# Patient Record
Sex: Female | Born: 1985 | Hispanic: Yes | Marital: Single | Smoking: Never smoker
Health system: Southern US, Community
[De-identification: ages and names within clinical notes are randomized; demographics above are authoritative.]

## PROBLEM LIST (undated history)

## (undated) DIAGNOSIS — Z8619 Personal history of other infectious and parasitic diseases: Secondary | ICD-10-CM

## (undated) DIAGNOSIS — H33001 Unspecified retinal detachment with retinal break, right eye: Secondary | ICD-10-CM

## (undated) DIAGNOSIS — D649 Anemia, unspecified: Secondary | ICD-10-CM

## (undated) DIAGNOSIS — Z9889 Other specified postprocedural states: Secondary | ICD-10-CM

## (undated) DIAGNOSIS — N898 Other specified noninflammatory disorders of vagina: Secondary | ICD-10-CM

## (undated) DIAGNOSIS — R112 Nausea with vomiting, unspecified: Secondary | ICD-10-CM

## (undated) DIAGNOSIS — A64 Unspecified sexually transmitted disease: Secondary | ICD-10-CM

## (undated) DIAGNOSIS — Z523 Bone marrow donor: Secondary | ICD-10-CM

## (undated) DIAGNOSIS — K219 Gastro-esophageal reflux disease without esophagitis: Secondary | ICD-10-CM

## (undated) DIAGNOSIS — F419 Anxiety disorder, unspecified: Secondary | ICD-10-CM

## (undated) DIAGNOSIS — O3663X0 Maternal care for excessive fetal growth, third trimester, not applicable or unspecified: Secondary | ICD-10-CM

## (undated) HISTORY — DX: Bone marrow donor: Z52.3

## (undated) HISTORY — DX: Unspecified sexually transmitted disease: A64

## (undated) HISTORY — DX: Other specified noninflammatory disorders of vagina: N89.8

## (undated) HISTORY — PX: RETINAL TEAR REPAIR CRYOTHERAPY: SHX5304

## (undated) HISTORY — DX: Personal history of other infectious and parasitic diseases: Z86.19

## (undated) HISTORY — DX: Unspecified retinal detachment with retinal break, right eye: H33.001

## (undated) HISTORY — PX: OTHER SURGICAL HISTORY: SHX169

## (undated) HISTORY — DX: Maternal care for excessive fetal growth, third trimester, not applicable or unspecified: O36.63X0

---
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
## 2007-01-29 ENCOUNTER — Other Ambulatory Visit: Admission: RE | Admit: 2007-01-29 | Discharge: 2007-01-29 | Payer: Self-pay | Admitting: Obstetrics and Gynecology

## 2007-04-18 ENCOUNTER — Emergency Department (HOSPITAL_COMMUNITY): Admission: EM | Admit: 2007-04-18 | Discharge: 2007-04-18 | Payer: Self-pay | Admitting: Emergency Medicine

## 2007-10-10 ENCOUNTER — Encounter: Admission: RE | Admit: 2007-10-10 | Discharge: 2007-10-10 | Payer: Self-pay | Admitting: Emergency Medicine

## 2007-12-25 ENCOUNTER — Ambulatory Visit (HOSPITAL_COMMUNITY): Admission: RE | Admit: 2007-12-25 | Discharge: 2007-12-26 | Payer: Self-pay | Admitting: Otolaryngology

## 2007-12-25 ENCOUNTER — Encounter (INDEPENDENT_AMBULATORY_CARE_PROVIDER_SITE_OTHER): Payer: Self-pay | Admitting: Otolaryngology

## 2008-03-19 HISTORY — PX: LYMPH NODE BIOPSY: SHX201

## 2008-07-27 ENCOUNTER — Other Ambulatory Visit: Admission: RE | Admit: 2008-07-27 | Discharge: 2008-07-27 | Payer: Self-pay | Admitting: Obstetrics and Gynecology

## 2010-08-01 NOTE — Op Note (Signed)
NAMESAHRA, CONVERSE                ACCOUNT NO.:  [id number]   MEDICAL RECORD NO.:  [id number]          PATIENT TYPE:  OIB   LOCATION:  5151                         FACILITY:  [facility]   PHYSICIAN:  Kinnie Scales. 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   SURGICAL PROCEDURE:  Transcervical excision of left neck cyst.   SURGEON:  Kinnie Scales. Annalee Genta, MD   ASSISTANT:  Gloris Manchester. Lazarus Salines, MD   ANESTHESIA:  General endotracheal.   COMPLICATIONS:  None.   ESTIMATED BLOOD LOSS:  Minimal.   The patient transferred from the operating room to recovery room in  stable condition.   BRIEF HISTORY:  The patient is a 25 year old Hispanic female who is  referred to our office by Dr. Darnell Level for evaluation of a  progressive left neck mass.  The patient reported a 43-month history of  gradual enlarging mass involving the left superolateral neck.  There had  been mild discomfort, no significant erythema, and no evidence of active  infection.  The patient was treated with a course of antibiotics and a  CT scan with contrast was obtained.  This showed a complex mass in the  left neck deep to the sternocleidomastoid muscle and adjacent to the  carotid artery and jugular vein with extension medially to the  parapharyngeal area.  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  Given the patient's history and physical examination, I  recommended to undertake excision of the cyst under general anesthesia.  The risk, benefits and possible complications of this procedure  were  discussed in detail with the patient and her mother and they understood  and concurred with our plan for surgery which is scheduled under general  anesthesia at [facility] OR with an overnight observation.  The risks and benefits of the procedure were discussed in detail and  they understood and concurred with our plan which is scheduled as above.   PROCEDURE:  The patient was brought to the operating room on December 25, 2007, and placed in a supine position on the operating table.  General  endotracheal anesthesia was established without difficulty and when the  patient was adequately anesthetized, she was positioned on the operating  table and prepped and draped in a sterile fashion.  The patient was  injected with 3 mL of 1% lidocaine and 1:100,000 dilution of epinephrine  was injected in a subcutaneous fashion in the skin underlying the  proposed incision.  After allowing adequate time for vasoconstriction  and hemostasis, the procedure was begun.   A 4-cm horizontally oriented incision was created in the left lateral  neck.  This was carried through the skin  underlying deep subcutaneous  tissue.  The platysma muscle was identified and divided and subplatysmal  flaps were elevated superiorly and inferiorly to allow access to the  left neck.  The anterior border of the sternocleidomastoid muscle was  identified and overlying fascia was dissected.  Sternocleidomastoid  muscle was reflected posteriorly and a large dark-colored cystic mass  was identified in the deep compartment of the neck.  With gentle blunt  and sharp dissection, the entire mass was dissected free of the  surrounding tissue.  The carotid artery and jugular vein were identified  and preserved throughout their course, the facial vein was identified  and reflected anteriorly and superiorly and was preserved.  The vagus  nerve was also identified and preserved.  Dissection was then carefully  carried  out along the entire cystic mass which was removed and sent to  pathology for gross microscopic evaluation.  There was a second superior  firm nodule consistent with a lymph node was also dissected and sent to  pathology as well.  The patient's wound was then thoroughly irrigated  with sterile saline solution.  There was no active bleeding.  The wound  was closed in layers and prior to closure, a 7-French round drain was  placed in the depth of the incision and sutured into position with a 3-0  Ethibond suture.  The incision was then carefully closed with a 4-0  Vicryl suture in an interrupted fashion to reapproximate the platysma  muscle, deep subcutaneous tissue was closed with 5-0 Vicryl and the  final skin edge was approximated with Dermabond surgical glue.  The  patient was then awakened from anesthetic, extubated and transferred  from the operating room to the recovery room in stable condition.  No  complications.  Blood loss was minimal.           ______________________________  Kinnie Scales. Annalee Genta, M.D.     DLS/MEDQ  D:  16/12/9602  T:  12/25/2007  Job:  [id number]

## 2010-12-07 LAB — I-STAT 8, (EC8 V) (CONVERTED LAB)
BUN: 10
Chloride: 109
Glucose, Bld: 93
Potassium: 3.7
pH, Ven: 7.388 — ABNORMAL HIGH

## 2010-12-07 LAB — URINALYSIS, ROUTINE W REFLEX MICROSCOPIC
Bilirubin Urine: NEGATIVE
Hgb urine dipstick: NEGATIVE
Protein, ur: NEGATIVE
Urobilinogen, UA: 1

## 2010-12-07 LAB — WET PREP, GENITAL
Clue Cells Wet Prep HPF POC: NONE SEEN
Trich, Wet Prep: NONE SEEN

## 2010-12-07 LAB — ABO/RH: ABO/RH(D): A POS

## 2010-12-07 LAB — PREGNANCY, URINE

## 2010-12-07 LAB — POCT I-STAT CREATININE
Creatinine, Ser: 0.7
Operator id: 294501

## 2010-12-07 LAB — GC/CHLAMYDIA PROBE AMP, GENITAL

## 2010-12-18 LAB — HCG, SERUM, QUALITATIVE: Preg, Serum: NEGATIVE

## 2010-12-18 LAB — CBC
Platelets: 272
WBC: 7.6

## 2011-07-11 ENCOUNTER — Encounter (INDEPENDENT_AMBULATORY_CARE_PROVIDER_SITE_OTHER): Payer: Managed Care, Other (non HMO) | Admitting: Ophthalmology

## 2011-07-11 DIAGNOSIS — H43819 Vitreous degeneration, unspecified eye: Secondary | ICD-10-CM

## 2011-07-11 DIAGNOSIS — H33009 Unspecified retinal detachment with retinal break, unspecified eye: Secondary | ICD-10-CM

## 2011-07-11 DIAGNOSIS — H33309 Unspecified retinal break, unspecified eye: Secondary | ICD-10-CM

## 2011-07-11 DIAGNOSIS — H35419 Lattice degeneration of retina, unspecified eye: Secondary | ICD-10-CM

## 2011-07-12 ENCOUNTER — Other Ambulatory Visit (INDEPENDENT_AMBULATORY_CARE_PROVIDER_SITE_OTHER): Payer: Self-pay | Admitting: Ophthalmology

## 2011-07-12 ENCOUNTER — Encounter (HOSPITAL_COMMUNITY): Payer: Self-pay | Admitting: Pharmacy Technician

## 2011-07-12 ENCOUNTER — Ambulatory Visit (INDEPENDENT_AMBULATORY_CARE_PROVIDER_SITE_OTHER): Payer: Managed Care, Other (non HMO) | Admitting: Family Medicine

## 2011-07-12 DIAGNOSIS — F411 Generalized anxiety disorder: Secondary | ICD-10-CM

## 2011-07-12 DIAGNOSIS — K219 Gastro-esophageal reflux disease without esophagitis: Secondary | ICD-10-CM

## 2011-07-12 DIAGNOSIS — R079 Chest pain, unspecified: Secondary | ICD-10-CM

## 2011-07-12 DIAGNOSIS — H33001 Unspecified retinal detachment with retinal break, right eye: Secondary | ICD-10-CM

## 2011-07-12 DIAGNOSIS — F419 Anxiety disorder, unspecified: Secondary | ICD-10-CM

## 2011-07-12 HISTORY — DX: Unspecified retinal detachment with retinal break, right eye: H33.001

## 2011-07-12 MED ORDER — LORAZEPAM 0.5 MG PO TABS: 0.5000 mg | ORAL_TABLET | Freq: Two times a day (BID) | ORAL | Status: AC | PRN

## 2011-07-12 MED ORDER — OMEPRAZOLE 40 MG PO CPDR: 40.0000 mg | DELAYED_RELEASE_CAPSULE | Freq: Every day | ORAL | Status: DC

## 2011-07-12 NOTE — H&P (Signed)
Nichole Davis is an 26 y.o. female.   Chief Complaint: Flashes and shadows right eye  HPI: Rhegmatogenous retinal detachment right eye  No past medical history on file.  No past surgical history on file.  No family history on file. Social History:  does not have a smoking history on file. She does not have any smokeless tobacco history on file. Her alcohol and drug histories not on file.  Allergies: No Known Allergies   (Not in a hospital admission)  Review of systems otherwise negative  There were no vitals taken for this visit.  Physical exam: Mental status: oriented x3. Eyes: See eye exam associated with this date of surgery in media tab.  Scanned in by scanning center Ears, Nose, Throat: within normal limits Neck: Within Normal limits General: within normal limits Chest: Within normal limits Breast: deferred Heart: Within normal limits Abdomen: Within normal limits GU: deferred Extremities: within normal limits Skin: within normal limits  Assessment/Plan Rhegmatogenous retinal detachment right eye Plan: To [facility] for Scleral buckle repair of Rhegmatogenous retinal detachment right eye  Sherrie George 07/12/2011, 12:56 PM

## 2011-07-12 NOTE — Patient Instructions (Signed)
You are probably having some acid reflux causing your chest wall pains. He has his probably being made worse by the stress and anxiety regarding your upcoming surgery. The medication for anxiety he is to be used only on an as-needed basis, and is not intended for long-term use.  I do not see anything at this time that should be a problem for you undergoing her surgery next week. However if having worse problems before then return for a recheck.

## 2011-07-12 NOTE — Progress Notes (Signed)
Subjective: Patient is here with chest pain for the past week. It's been hurting her through the day he. She sometimes even wakes up in the morning with it. It's a substernal squeezing-type sensation. She is scheduled to undergo surgery for a detaching retina next week. She is anxious about that. She does not know of having any GERD type symptoms. She's not sure exactly what that is.  Objective: Alert oriented young lady in no acute distress. Throat clear. Neck supple without significant nodes. Chest clear. Heart regular without murmurs gallops or arrhythmias. No chest wall tenderness. Soft without mass or tenderness.  Assessment: Chest pain  Anxiety retinal detachment  Plan: Check EKG. Assuming this is normal, will treat for GERD and anxiety. EKG was normal. Lorazepam and omeprazole prescribed. Return when necessary.

## 2011-07-13 ENCOUNTER — Encounter (HOSPITAL_COMMUNITY): Payer: Self-pay | Admitting: *Deleted

## 2011-07-13 NOTE — Progress Notes (Signed)
Pt states she had some chest pain on 4/25, went to urgent care on [address] and had EKG, was told it was probably anxiety and given prescription for Ativan. Will request EKG.

## 2011-07-16 MED ORDER — PHENYLEPHRINE HCL 2.5 % OP SOLN
1.0000 [drp] | OPHTHALMIC | Status: DC | PRN
  Filled 2011-07-16: qty 3

## 2011-07-16 MED ORDER — TROPICAMIDE 1 % OP SOLN
1.0000 [drp] | OPHTHALMIC | Status: DC | PRN
  Filled 2011-07-16: qty 3

## 2011-07-16 MED ORDER — CYCLOPENTOLATE HCL 1 % OP SOLN
1.0000 [drp] | OPHTHALMIC | Status: DC | PRN
Start: 2011-07-16 — End: 2011-07-17
  Filled 2011-07-16: qty 2

## 2011-07-16 MED ORDER — CEFAZOLIN SODIUM 1-5 GM-% IV SOLN
1.0000 g | INTRAVENOUS | Status: DC
  Filled 2011-07-16: qty 50

## 2011-07-16 MED ORDER — GATIFLOXACIN 0.5 % OP SOLN
1.0000 [drp] | OPHTHALMIC | Status: DC | PRN
  Filled 2011-07-16: qty 2.5

## 2011-07-17 ENCOUNTER — Encounter (HOSPITAL_COMMUNITY): Payer: Self-pay

## 2011-07-17 ENCOUNTER — Encounter (HOSPITAL_COMMUNITY)
Admission: RE | Disposition: A | Discharge status: Home / Self Care | Payer: Self-pay | Source: Ambulatory Visit | Attending: Ophthalmology

## 2011-07-17 ENCOUNTER — Ambulatory Visit (HOSPITAL_COMMUNITY)
Admission: RE | Admit: 2011-07-17 | Discharge: 2011-07-18 | Disposition: A | Discharge status: Home / Self Care | Payer: Managed Care, Other (non HMO) | Source: Ambulatory Visit | Attending: Ophthalmology | Admitting: Ophthalmology

## 2011-07-17 ENCOUNTER — Encounter (HOSPITAL_COMMUNITY): Payer: Self-pay | Admitting: Anesthesiology

## 2011-07-17 ENCOUNTER — Encounter (HOSPITAL_COMMUNITY): Payer: Self-pay | Admitting: General Practice

## 2011-07-17 ENCOUNTER — Ambulatory Visit (HOSPITAL_COMMUNITY): Payer: Managed Care, Other (non HMO) | Admitting: Anesthesiology

## 2011-07-17 ENCOUNTER — Telehealth: Payer: Self-pay

## 2011-07-17 DIAGNOSIS — H33001 Unspecified retinal detachment with retinal break, right eye: Secondary | ICD-10-CM

## 2011-07-17 DIAGNOSIS — H33009 Unspecified retinal detachment with retinal break, unspecified eye: Secondary | ICD-10-CM | POA: Insufficient documentation

## 2011-07-17 DIAGNOSIS — H35419 Lattice degeneration of retina, unspecified eye: Secondary | ICD-10-CM | POA: Insufficient documentation

## 2011-07-17 DIAGNOSIS — F411 Generalized anxiety disorder: Secondary | ICD-10-CM | POA: Insufficient documentation

## 2011-07-17 DIAGNOSIS — K219 Gastro-esophageal reflux disease without esophagitis: Secondary | ICD-10-CM | POA: Insufficient documentation

## 2011-07-17 HISTORY — DX: Nausea with vomiting, unspecified: R11.2

## 2011-07-17 HISTORY — PX: SCLERAL BUCKLE: SHX5340

## 2011-07-17 HISTORY — PX: RETINAL DETACHMENT REPAIR W/ SCLERAL BUCKLE LE: SHX2338

## 2011-07-17 HISTORY — DX: Anemia, unspecified: D64.9

## 2011-07-17 HISTORY — DX: Nausea with vomiting, unspecified: Z98.890

## 2011-07-17 HISTORY — DX: Anxiety disorder, unspecified: F41.9

## 2011-07-17 HISTORY — DX: Gastro-esophageal reflux disease without esophagitis: K21.9

## 2011-07-17 LAB — CBC
HCT: 36.2 % (ref 36.0–46.0)
MCHC: 32 g/dL (ref 30.0–36.0)
MCV: 78.7 fL (ref 78.0–100.0)
Platelets: 260 10*3/uL (ref 150–400)
RDW: 15.1 % (ref 11.5–15.5)
WBC: 6 10*3/uL (ref 4.0–10.5)

## 2011-07-17 LAB — BASIC METABOLIC PANEL
CO2: 25 mEq/L (ref 19–32)
Calcium: 9.1 mg/dL (ref 8.4–10.5)
Creatinine, Ser: 0.72 mg/dL (ref 0.50–1.10)
GFR calc non Af Amer: >90 mL/min (ref >90)
Glucose, Bld: 91 mg/dL (ref 70–99)
Sodium: 141 mEq/L (ref 135–145)

## 2011-07-17 LAB — SURGICAL PCR SCREEN: Staphylococcus aureus: NEGATIVE

## 2011-07-17 SURGERY — SCLERAL BUCKLE
Anesthesia: General | Site: Eye | Laterality: Right | Wound class: Clean

## 2011-07-17 MED ORDER — ONDANSETRON HCL 4 MG/2ML IJ SOLN: 4.0000 mg | Freq: Four times a day (QID) | INTRAMUSCULAR | Status: DC | PRN

## 2011-07-17 MED ORDER — MUPIROCIN 2 % EX OINT
TOPICAL_OINTMENT | CUTANEOUS | Status: AC
  Administered 2011-07-17: 1
  Filled 2011-07-17: qty 22

## 2011-07-17 MED ORDER — BRIMONIDINE TARTRATE 0.2 % OP SOLN
1.0000 [drp] | Freq: Two times a day (BID) | OPHTHALMIC | Status: DC
  Filled 2011-07-17: qty 5

## 2011-07-17 MED ORDER — SODIUM CHLORIDE 0.9 % IV SOLN
INTRAVENOUS | Status: DC | PRN
  Administered 2011-07-17 (×2): via INTRAVENOUS

## 2011-07-17 MED ORDER — PROMETHAZINE HCL 25 MG/ML IJ SOLN
INTRAMUSCULAR | Status: AC
  Filled 2011-07-17: qty 1

## 2011-07-17 MED ORDER — DEXAMETHASONE SODIUM PHOSPHATE 10 MG/ML IJ SOLN
INTRAMUSCULAR | Status: DC | PRN
  Administered 2011-07-17: 10 mg

## 2011-07-17 MED ORDER — HEMOSTATIC AGENTS (NO CHARGE) OPTIME
TOPICAL | Status: DC | PRN
  Administered 2011-07-17: 1 via TOPICAL

## 2011-07-17 MED ORDER — MIDAZOLAM HCL 5 MG/5ML IJ SOLN
INTRAMUSCULAR | Status: DC | PRN
  Administered 2011-07-17: 2 mg via INTRAVENOUS

## 2011-07-17 MED ORDER — SODIUM CHLORIDE 0.9 % IR SOLN
Status: DC | PRN
  Administered 2011-07-17: 200 mL

## 2011-07-17 MED ORDER — HYDROMORPHONE HCL PF 1 MG/ML IJ SOLN: 0.2500 mg | INTRAMUSCULAR | Status: DC | PRN

## 2011-07-17 MED ORDER — BSS IO SOLN
INTRAOCULAR | Status: DC | PRN
  Administered 2011-07-17: 15 mL via INTRAOCULAR

## 2011-07-17 MED ORDER — BUPIVACAINE HCL 0.75 % IJ SOLN
INTRAMUSCULAR | Status: DC | PRN
  Administered 2011-07-17: 10 mL

## 2011-07-17 MED ORDER — SODIUM CHLORIDE 0.45 % IV SOLN
INTRAVENOUS | Status: DC
  Administered 2011-07-17: 17:00:00 via INTRAVENOUS

## 2011-07-17 MED ORDER — MAGNESIUM HYDROXIDE 400 MG/5ML PO SUSP: 15.0000 mL | Freq: Four times a day (QID) | ORAL | Status: DC | PRN

## 2011-07-17 MED ORDER — PROMETHAZINE HCL 25 MG/ML IJ SOLN
12.5000 mg | Freq: Four times a day (QID) | INTRAMUSCULAR | Status: AC | PRN
  Administered 2011-07-17: 6.25 mg via INTRAVENOUS

## 2011-07-17 MED ORDER — TROPICAMIDE 1 % OP SOLN
1.0000 [drp] | OPHTHALMIC | Status: AC | PRN
  Administered 2011-07-17 (×3): 1 [drp] via OPHTHALMIC

## 2011-07-17 MED ORDER — MORPHINE SULFATE 2 MG/ML IJ SOLN: 1.0000 mg | INTRAMUSCULAR | Status: DC | PRN

## 2011-07-17 MED ORDER — BACITRACIN-POLYMYXIN B 500-10000 UNIT/GM OP OINT
1.0000 "application " | TOPICAL_OINTMENT | Freq: Four times a day (QID) | OPHTHALMIC | Status: DC
  Filled 2011-07-17: qty 3.5

## 2011-07-17 MED ORDER — ONDANSETRON HCL 4 MG/2ML IJ SOLN
4.0000 mg | Freq: Once | INTRAMUSCULAR | Status: AC
  Administered 2011-07-17: 4 mg via INTRAVENOUS

## 2011-07-17 MED ORDER — GATIFLOXACIN 0.5 % OP SOLN
1.0000 [drp] | Freq: Four times a day (QID) | OPHTHALMIC | Status: DC
  Filled 2011-07-17: qty 2.5

## 2011-07-17 MED ORDER — TEMAZEPAM 15 MG PO CAPS: 15.0000 mg | ORAL_CAPSULE | Freq: Every evening | ORAL | Status: DC | PRN

## 2011-07-17 MED ORDER — TETRACAINE HCL 0.5 % OP SOLN
1.0000 [drp] | Freq: Once | OPHTHALMIC | Status: DC
  Filled 2011-07-17: qty 2

## 2011-07-17 MED ORDER — PHENYLEPHRINE HCL 2.5 % OP SOLN
1.0000 [drp] | OPHTHALMIC | Status: AC | PRN
  Administered 2011-07-17 (×3): 1 [drp] via OPHTHALMIC

## 2011-07-17 MED ORDER — CEFAZOLIN SODIUM 1-5 GM-% IV SOLN
INTRAVENOUS | Status: DC | PRN
  Administered 2011-07-17: 1 g via INTRAVENOUS

## 2011-07-17 MED ORDER — SODIUM CHLORIDE 0.9 % IV SOLN
INTRAVENOUS | Status: DC
  Administered 2011-07-17: 12:00:00 via INTRAVENOUS

## 2011-07-17 MED ORDER — GLYCOPYRROLATE 0.2 MG/ML IJ SOLN
INTRAMUSCULAR | Status: DC | PRN
  Administered 2011-07-17: .6 mg via INTRAVENOUS

## 2011-07-17 MED ORDER — NEOSTIGMINE METHYLSULFATE 1 MG/ML IJ SOLN
INTRAMUSCULAR | Status: DC | PRN
  Administered 2011-07-17: 4 mg via INTRAVENOUS

## 2011-07-17 MED ORDER — ROCURONIUM BROMIDE 100 MG/10ML IV SOLN
INTRAVENOUS | Status: DC | PRN
  Administered 2011-07-17 (×2): 5 mg via INTRAVENOUS
  Administered 2011-07-17: 40 mg via INTRAVENOUS

## 2011-07-17 MED ORDER — OXYCODONE-ACETAMINOPHEN 5-325 MG PO TABS
1.0000 | ORAL_TABLET | ORAL | Status: DC | PRN
  Administered 2011-07-17: 1 via ORAL
  Filled 2011-07-17: qty 1

## 2011-07-17 MED ORDER — STERILE WATER FOR IRRIGATION IR SOLN
Status: DC | PRN
  Administered 2011-07-17: 500 mL

## 2011-07-17 MED ORDER — SODIUM CHLORIDE 0.9 % IJ SOLN
INTRAMUSCULAR | Status: DC | PRN
  Administered 2011-07-17: 13:00:00

## 2011-07-17 MED ORDER — PROPOFOL 10 MG/ML IV EMUL
INTRAVENOUS | Status: DC | PRN
  Administered 2011-07-17: 200 mg via INTRAVENOUS

## 2011-07-17 MED ORDER — GATIFLOXACIN 0.5 % OP SOLN
1.0000 [drp] | OPHTHALMIC | Status: AC | PRN
  Administered 2011-07-17 (×3): 1 [drp] via OPHTHALMIC

## 2011-07-17 MED ORDER — PREDNISOLONE ACETATE 1 % OP SUSP
1.0000 [drp] | Freq: Four times a day (QID) | OPHTHALMIC | Status: DC
  Filled 2011-07-17: qty 1

## 2011-07-17 MED ORDER — FENTANYL CITRATE 0.05 MG/ML IJ SOLN
INTRAMUSCULAR | Status: DC | PRN
  Administered 2011-07-17 (×4): 50 ug via INTRAVENOUS

## 2011-07-17 MED ORDER — ACETAMINOPHEN 325 MG PO TABS: 325.0000 mg | ORAL_TABLET | ORAL | Status: DC | PRN

## 2011-07-17 MED ORDER — ONDANSETRON HCL 4 MG/2ML IJ SOLN
INTRAMUSCULAR | Status: DC | PRN
  Administered 2011-07-17: 4 mg via INTRAVENOUS

## 2011-07-17 MED ORDER — ACETAZOLAMIDE SODIUM 500 MG IJ SOLR
500.0000 mg | Freq: Once | INTRAMUSCULAR | Status: AC
  Administered 2011-07-18: 500 mg via INTRAVENOUS
  Filled 2011-07-17: qty 500

## 2011-07-17 MED ORDER — BACITRACIN-POLYMYXIN B 500-10000 UNIT/GM OP OINT
TOPICAL_OINTMENT | OPHTHALMIC | Status: DC | PRN
  Administered 2011-07-17: 1 via OPHTHALMIC

## 2011-07-17 MED ORDER — CYCLOPENTOLATE HCL 1 % OP SOLN
1.0000 [drp] | OPHTHALMIC | Status: AC | PRN
  Administered 2011-07-17 (×3): 1 [drp] via OPHTHALMIC

## 2011-07-17 MED ORDER — LATANOPROST 0.005 % OP SOLN
1.0000 [drp] | Freq: Every day | OPHTHALMIC | Status: DC
  Filled 2011-07-17: qty 2.5

## 2011-07-17 SURGICAL SUPPLY — 76 items
APL SRG 3 HI ABS STRL LF PLS (MISCELLANEOUS) ×6
APPLICATOR DR MATTHEWS STRL (MISCELLANEOUS) ×14 IMPLANT
BALL CTTN LRG ABS STRL LF (GAUZE/BANDAGES/DRESSINGS) ×3
BAND SCLERAL BUCKLING TYPE 240 (Ophthalmic Related) ×1 IMPLANT
BLADE EYE CATARACT 19 1.4 BEAV (BLADE) ×1 IMPLANT
BLADE MVR KNIFE 19G (BLADE) IMPLANT
BLADE SURG 15 STRL LF DISP TIS (BLADE) IMPLANT
BLADE SURG 15 STRL SS (BLADE)
CANNULA ANT CHAM MAIN (OPHTHALMIC RELATED) IMPLANT
CANNULA DUAL BORE 23G (CANNULA) IMPLANT
CORDS BIPOLAR (ELECTRODE) IMPLANT
COTTONBALL LRG STERILE PKG (GAUZE/BANDAGES/DRESSINGS) ×6 IMPLANT
COVER SURGICAL LIGHT HANDLE (MISCELLANEOUS) ×2 IMPLANT
DRAPE OPHTHALMIC 77X100 STRL (CUSTOM PROCEDURE TRAY) ×2 IMPLANT
ERASER HMR WETFIELD 23G BP (MISCELLANEOUS) IMPLANT
FILTER BLUE MILLIPORE (MISCELLANEOUS) IMPLANT
FILTER STRAW FLUID ASPIR (MISCELLANEOUS) IMPLANT
GAS OPHTHALMIC (MISCELLANEOUS) IMPLANT
GLOVE ECLIPSE 6.5 STRL STRAW (GLOVE) ×1 IMPLANT
GLOVE SS BIOGEL STRL SZ 6.5 (GLOVE) ×2 IMPLANT
GLOVE SS BIOGEL STRL SZ 7 (GLOVE) ×1 IMPLANT
GLOVE SUPERSENSE BIOGEL SZ 6.5 (GLOVE) ×2
GLOVE SUPERSENSE BIOGEL SZ 7 (GLOVE) ×1
GLOVE SURG 8.5 LATEX PF (GLOVE) ×4 IMPLANT
GLOVE SURG SS PI 6.5 STRL IVOR (GLOVE) ×1 IMPLANT
GOWN STRL NON-REIN LRG LVL3 (GOWN DISPOSABLE) ×6 IMPLANT
ILLUMINATOR CHOW PICK 25GA (MISCELLANEOUS) IMPLANT
KIT BASIN OR (CUSTOM PROCEDURE TRAY) ×1 IMPLANT
KIT PERFLUORON PROCEDURE 5ML (MISCELLANEOUS) IMPLANT
KIT ROOM TURNOVER OR (KITS) ×2 IMPLANT
KNIFE CRESCENT 1.75 EDGEAHEAD (BLADE) IMPLANT
KNIFE GRIESHABER SHARP 2.5MM (MISCELLANEOUS) ×5 IMPLANT
MARKER SKIN DUAL TIP RULER LAB (MISCELLANEOUS) ×1 IMPLANT
MASK EYE SHIELD (GAUZE/BANDAGES/DRESSINGS) ×1 IMPLANT
NDL 25GX 5/8IN NON SAFETY (NEEDLE) IMPLANT
NDL HYPO 30X.5 LL (NEEDLE) ×2 IMPLANT
NEEDLE 18GX1X1/2 (RX/OR ONLY) (NEEDLE) ×2 IMPLANT
NEEDLE 25GX 5/8IN NON SAFETY (NEEDLE) IMPLANT
NEEDLE 27GAX1X1/2 (NEEDLE) IMPLANT
NEEDLE HYPO 30X.5 LL (NEEDLE) ×4 IMPLANT
NS IRRIG 1000ML POUR BTL (IV SOLUTION) ×2 IMPLANT
PACK VITRECTOMY CUSTOM (CUSTOM PROCEDURE TRAY) ×2 IMPLANT
PAD ARMBOARD 7.5X6 YLW CONV (MISCELLANEOUS) ×3 IMPLANT
PAD EYE OVAL STERILE LF (GAUZE/BANDAGES/DRESSINGS) ×1 IMPLANT
PAK VITRECTOMY PIK 25 GA (OPHTHALMIC RELATED) IMPLANT
PROBE DIRECTIONAL LASER (MISCELLANEOUS) IMPLANT
REPL STRA BRUSH NDL (NEEDLE) IMPLANT
REPL STRA BRUSH NEEDLE (NEEDLE) IMPLANT
RESERVOIR BACK FLUSH (MISCELLANEOUS) IMPLANT
ROLLS DENTAL (MISCELLANEOUS) ×4 IMPLANT
SET FLUID INJECTOR (SET/KITS/TRAYS/PACK) IMPLANT
SET VGFI TUBING 8065808002 (SET/KITS/TRAYS/PACK) IMPLANT
SPEAR EYE SURG WECK-CEL (MISCELLANEOUS) ×4 IMPLANT
SPONGE GROOVED SILICONE 4X12X8 (Ophthalmic Related) ×1 IMPLANT
SPONGE SURGIFOAM ABS GEL 12-7 (HEMOSTASIS) ×1 IMPLANT
STOPCOCK 4 WAY LG BORE MALE ST (IV SETS) IMPLANT
SUT CHROMIC 7 0 TG140 8 (SUTURE) ×2 IMPLANT
SUT ETHILON 9 0 TG140 8 (SUTURE) IMPLANT
SUT MERSILENE 4 0 RV 2 (SUTURE) ×2 IMPLANT
SUT SILK 2 0 (SUTURE) ×2
SUT SILK 2-0 18XBRD TIE 12 (SUTURE) ×1 IMPLANT
SUT SILK 4 0 RB 1 (SUTURE) ×2 IMPLANT
SUT VICRYL 7 0 TG140 8 (SUTURE) IMPLANT
SYR 20CC LL (SYRINGE) ×2 IMPLANT
SYR 5ML LL (SYRINGE) IMPLANT
SYR BULB 3OZ (MISCELLANEOUS) ×2 IMPLANT
SYR TB 1ML LUER SLIP (SYRINGE) IMPLANT
SYRINGE 10CC LL (SYRINGE) IMPLANT
TAPE SURG TRANSPORE 1 IN (GAUZE/BANDAGES/DRESSINGS) IMPLANT
TAPE SURGICAL TRANSPORE 1 IN (GAUZE/BANDAGES/DRESSINGS) ×1
TIRE 11 SCLERAL TYPE 279 (Ophthalmic Related) ×2 IMPLANT
TOWEL OR 17X24 6PK STRL BLUE (TOWEL DISPOSABLE) ×6 IMPLANT
TUBING ART PRESS 12 MALE/MALE (MISCELLANEOUS) IMPLANT
VITREORETINAL VISCODISSEC (MISCELLANEOUS) IMPLANT
WATER STERILE IRR 1000ML POUR (IV SOLUTION) ×2 IMPLANT
WIPE INSTRUMENT VISIWIPE 73X73 (MISCELLANEOUS) ×2 IMPLANT

## 2011-07-17 NOTE — H&P (Signed)
I examined the patient today and there is no change in the medical status 

## 2011-07-17 NOTE — Anesthesia Postprocedure Evaluation (Signed)
  Anesthesia Post-op Note  Patient: Nichole MCGUIRT  Procedure(s) Performed: Procedure(s) (LRB): SCLERAL BUCKLE (Right) PHOTOCOAGULATION WITH LASER (Right)  Patient Location: PACU  Anesthesia Type: General  Level of Consciousness: awake  Airway and Oxygen Therapy: Patient Spontanous Breathing and Patient connected to nasal cannula oxygen  Post-op Pain: mild  Post-op Assessment: Post-op Vital signs reviewed, Patient's Cardiovascular Status Stable, Respiratory Function Stable, Patent Airway and NAUSEA AND VOMITING PRESENT  Post-op Vital Signs: Reviewed and stable  Complications: No apparent anesthesia complications

## 2011-07-17 NOTE — Telephone Encounter (Signed)
TARA FROM [facility] STATES THEY NEED THE EKG AND LAST OV NOTES FAXED ON PT PLEASE FAX TO [phone] AND THE PHONE NUMBER IS [phone]

## 2011-07-17 NOTE — Transfer of Care (Signed)
Immediate Anesthesia Transfer of Care Note  Patient: Nichole Davis  Procedure(s) Performed: Procedure(s) (LRB): SCLERAL BUCKLE (Right)  Patient Location: PACU  Anesthesia Type: General  Level of Consciousness: awake, alert  and oriented  Airway & Oxygen Therapy: Patient Spontanous Breathing and Patient connected to nasal cannula oxygen  Post-op Assessment: Report given to PACU RN and Post -op Vital signs reviewed and stable  Post vital signs: Reviewed and stable  Complications: No apparent anesthesia complications

## 2011-07-17 NOTE — Procedures (Signed)
Brief Operative note   Preoperative diagnosis:  Pre-Op Diagnosis Codes:    * Retinal detachment with retinal defect, unspecified [361.00] Postoperative diagnosis  Post-Op Diagnosis Codes:    * Retinal detachment with retinal defect, unspecified [361.00]  Procedures: Scleral Buckle right eye  Surgeon:  Sherrie George, MD...  Assistant:  Rosalie Doctor SA    Anesthesia: General  Specimen: none  Estimated blood loss:  1cc  Complications: none  Patient sent to PACU in good condition  Composed by Sherrie George MD  Dictation number: [phone]

## 2011-07-17 NOTE — Progress Notes (Signed)
Spoke with Steward Drone at [facility] & she reports EKG was scanned in under office note for visit  Dated 07/12/2011

## 2011-07-17 NOTE — Progress Notes (Signed)
Pt.stated nausea better, pt. Falling asleep and having to be reminded to take deep breaths., pt. Vomited moderate amts clear liquids, states she feels better

## 2011-07-17 NOTE — Progress Notes (Signed)
Call to [facility] for EKG result that pt. Reports that she had there at last appt. It will be looked into now.

## 2011-07-17 NOTE — Preoperative (Signed)
Beta Blockers   Reason not to administer Beta Blockers:Not Applicable 

## 2011-07-17 NOTE — Anesthesia Preprocedure Evaluation (Addendum)
Anesthesia Evaluation  Patient identified by MRN, date of birth, ID band Patient awake    Reviewed: Allergy & Precautions, H&P , NPO status , Patient's Chart, lab work & pertinent test results  History of Anesthesia Complications Negative for: history of anesthetic complications  Airway Mallampati: II TM Distance: >3 FB Neck ROM: Full    Dental No notable dental hx. (+) Teeth Intact and Dental Advisory Given   Pulmonary neg pulmonary ROS,  breath sounds clear to auscultation  Pulmonary exam normal       Cardiovascular negative cardio ROS  Rhythm:Regular Rate:Normal     Neuro/Psych Anxiety negative neurological ROS  negative psych ROS   GI/Hepatic Neg liver ROS, GERD-  Controlled and Medicated,  Endo/Other  negative endocrine ROS  Renal/GU negative Renal ROS  negative genitourinary   Musculoskeletal   Abdominal   Peds  Hematology negative hematology ROS (+)   Anesthesia Other Findings   Reproductive/Obstetrics negative OB ROS                         Anesthesia Physical Anesthesia Plan  ASA: II  Anesthesia Plan: General   Post-op Pain Management:    Induction: Intravenous  Airway Management Planned: Oral ETT  Additional Equipment:   Intra-op Plan:   Post-operative Plan: Extubation in OR  Informed Consent: I have reviewed the patients History and Physical, chart, labs and discussed the procedure including the risks, benefits and alternatives for the proposed anesthesia with the patient or authorized representative who has indicated his/her understanding and acceptance.   Dental advisory given  Plan Discussed with: CRNA  Anesthesia Plan Comments:         Anesthesia Quick Evaluation

## 2011-07-18 ENCOUNTER — Encounter (HOSPITAL_COMMUNITY): Payer: Self-pay | Admitting: Ophthalmology

## 2011-07-18 MED ORDER — OXYCODONE-ACETAMINOPHEN 5-325 MG PO TABS: 1.0000 | ORAL_TABLET | Freq: Four times a day (QID) | ORAL | Status: AC | PRN

## 2011-07-18 MED ORDER — GATIFLOXACIN 0.5 % OP SOLN: 1.0000 [drp] | Freq: Four times a day (QID) | OPHTHALMIC | Status: DC

## 2011-07-18 MED ORDER — PREDNISOLONE ACETATE 1 % OP SUSP: 1.0000 [drp] | Freq: Four times a day (QID) | OPHTHALMIC | Status: AC

## 2011-07-18 MED ORDER — BACITRACIN-POLYMYXIN B 500-10000 UNIT/GM OP OINT: 1.0000 "application " | TOPICAL_OINTMENT | Freq: Three times a day (TID) | OPHTHALMIC | Status: AC

## 2011-07-18 NOTE — Progress Notes (Signed)
07/18/2011, 7:01 AM  Mental Status:  Awake, Alert, Oriented  Anterior segment: Cornea  Clear    Anterior Chamber Clear    Lens:   Clear  Intra Ocular Pressure 19 mmHg with Tonopen  Vitreous: Clear   Retina:  Attached Good laser reaction  Impression: Excellent result Retina attached   Final Diagnosis: Rhegmatogenous Retinal detachment right eye    Plan: start post operative eye drops.  Discharge to home.  Give post operative instructions  Sherrie George 07/18/2011, 7:01 AM

## 2011-07-18 NOTE — Op Note (Signed)
Nichole Davis, Nichole Davis                ACCOUNT NO.:  [id number]  MEDICAL RECORD NO.:  [id number]  LOCATION:  [facility]                         FACILITY:  [facility]  PHYSICIAN:  Beulah Gandy. Ashley Royalty, M.D. 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  PROCEDURES:  Scleral buckle, right eye.  Retinal photocoagulation, right eye.  SURGEON:  Beulah Gandy. Ashley Royalty, M.D.  ASSISTANT:  Rosalie Doctor, SA.  ANESTHESIA:  General.  DETAILS:  Usual prep and drape, 360-degree limbal peritomy, isolation of 4 rectus muscles on 2-0 silk.  Scleral dissection, primarily in the upper temporal quadrant from 9 to 12 with wide bed here and more narrow bed for the remainder of 360 degrees.  Diathermy was placed in the bed. A 279 implant head was fixed with 2 mm trim from the posterior edge between 12 o'clock and 9 o'clock.  The 279 implant was placed against the globe.  A 240 band and a 270 sleeve was placed at 4 o'clock.  The buckle joint was placed at 4 o'clock as well.  Two sutures per quadrant for a total of 8 scleral sutures were placed in the scleral flaps. Perforation site chosen at 11 o'clock with a small amount of thick clear- fluid coming forth and indirect ophthalmoscopy showed additional pocket of fluid at 10 o'clock and a second drainage was performed at 10 o'clock in the posterior aspect of the bed.  A clear gelatinous crystal emerged from the drainage and some additional thick subretinal fluid.  The 508G radial segment was placed beneath the break at 10 o'clock.  Indirect ophthalmoscopy showed the retina to be lying nicely in place on the scleral buckle.  The indirect ophthalmoscope laser was moved in place, 1352 burns were placed around the breaks and around the retinal periphery.  The power was between 200 and 400 mW, 1000  microns each and 0.1 seconds each.  The buckle was adjusted and trimmed.  The band was adjusted and trimmed.  The sutures were knotted and the free ends were removed.  The conjunctiva was reapproximated with 7-0 chromic suture. Polymyxin and gentamicin were irrigated into tenon space.  Marcaine was injected around the globe for postop pain Decadron 10 mg was injected into the lower subconjunctival space.  Paracentesis x2 at 9 o'clock at the limbus.  Obtained a closing pressure of 10 with a Barraquer tonometer.  Polysporin ophthalmic ointment, a patch and shield were placed.  The patient was awakened and taken to recovery in satisfactory condition.     Beulah Gandy. Ashley Royalty, M.D.    JDM/MEDQ  D:  07/17/2011  T:  07/18/2011  Job:  [id number]

## 2011-07-18 NOTE — Discharge Summary (Signed)
Discharge summary not needed on [facility] patients per medical records. 

## 2011-07-18 NOTE — Progress Notes (Signed)
Discharge instructions reviewed with pt and prescription given.  Pt verbalized understanding and questions answered.  Pt discharged in stable condition via wheelchair with family.  Samie Barclift Lindsay   

## 2011-07-24 ENCOUNTER — Inpatient Hospital Stay (INDEPENDENT_AMBULATORY_CARE_PROVIDER_SITE_OTHER): Payer: Managed Care, Other (non HMO) | Admitting: Ophthalmology

## 2011-07-24 DIAGNOSIS — H33009 Unspecified retinal detachment with retinal break, unspecified eye: Secondary | ICD-10-CM

## 2011-08-15 ENCOUNTER — Encounter (INDEPENDENT_AMBULATORY_CARE_PROVIDER_SITE_OTHER): Payer: Managed Care, Other (non HMO) | Admitting: Ophthalmology

## 2011-08-15 DIAGNOSIS — H33009 Unspecified retinal detachment with retinal break, unspecified eye: Secondary | ICD-10-CM

## 2011-09-05 ENCOUNTER — Encounter (INDEPENDENT_AMBULATORY_CARE_PROVIDER_SITE_OTHER): Payer: Managed Care, Other (non HMO) | Admitting: Ophthalmology

## 2011-09-05 DIAGNOSIS — H33309 Unspecified retinal break, unspecified eye: Secondary | ICD-10-CM

## 2011-09-05 DIAGNOSIS — H33009 Unspecified retinal detachment with retinal break, unspecified eye: Secondary | ICD-10-CM

## 2011-10-24 ENCOUNTER — Encounter (INDEPENDENT_AMBULATORY_CARE_PROVIDER_SITE_OTHER): Payer: Managed Care, Other (non HMO) | Admitting: Ophthalmology

## 2011-11-01 ENCOUNTER — Encounter (INDEPENDENT_AMBULATORY_CARE_PROVIDER_SITE_OTHER): Payer: Managed Care, Other (non HMO) | Admitting: Ophthalmology

## 2011-11-01 DIAGNOSIS — H33009 Unspecified retinal detachment with retinal break, unspecified eye: Secondary | ICD-10-CM

## 2011-11-01 DIAGNOSIS — H33309 Unspecified retinal break, unspecified eye: Secondary | ICD-10-CM

## 2011-11-01 DIAGNOSIS — H43819 Vitreous degeneration, unspecified eye: Secondary | ICD-10-CM

## 2012-02-05 ENCOUNTER — Encounter (INDEPENDENT_AMBULATORY_CARE_PROVIDER_SITE_OTHER): Payer: Managed Care, Other (non HMO) | Admitting: Ophthalmology

## 2012-02-05 DIAGNOSIS — H33309 Unspecified retinal break, unspecified eye: Secondary | ICD-10-CM

## 2012-02-05 DIAGNOSIS — H43819 Vitreous degeneration, unspecified eye: Secondary | ICD-10-CM

## 2012-02-05 DIAGNOSIS — H33009 Unspecified retinal detachment with retinal break, unspecified eye: Secondary | ICD-10-CM

## 2012-04-10 ENCOUNTER — Ambulatory Visit (INDEPENDENT_AMBULATORY_CARE_PROVIDER_SITE_OTHER): Payer: Managed Care, Other (non HMO) | Admitting: Physician Assistant

## 2012-04-10 VITALS — BP 111/75 | HR 87 | Temp 99.0°F | Resp 16 | Ht 61.0 in | Wt 124.0 lb

## 2012-04-10 DIAGNOSIS — R197 Diarrhea, unspecified: Secondary | ICD-10-CM

## 2012-04-10 DIAGNOSIS — Z8619 Personal history of other infectious and parasitic diseases: Secondary | ICD-10-CM

## 2012-04-10 LAB — POCT CBC
Granulocyte percent: 64.3 %G (ref 37–80)
HCT, POC: 39.2 % (ref 37.7–47.9)
Hemoglobin: 11.7 g/dL — AB (ref 12.2–16.2)
POC Granulocyte: 5.7 (ref 2–6.9)
RBC: 4.75 M/uL (ref 4.04–5.48)

## 2012-04-10 NOTE — Patient Instructions (Addendum)
Imodium over the counter for diarrhea. Bland foods - push fluids - limit dairy products for the next week.

## 2012-04-10 NOTE — Progress Notes (Signed)
[address], [place] [address]   Phone [phone]  Subjective:    Patient ID: Nichole Davis, female    DOB: 24-Jan-1986, 27 y.o.   MRN: [id number]  HPI Pt presents to clinic with some nausea and diarrhea that started yesterday - she was at work and she developed RLQ pain that was intense with some chills and then got immediate diarrhea.  Now she is having diarrhea every time she eats but the nausea has resolved.  She has not had any more abdominal cramping since yesterday.  She was tested for STDs on Friday and treated with Zithromax for chlamydia on Monday and Flagyl for BV.  She has not been sexually active since her visit with her GYN.  She is having some vaginal d/c that is dark brown since yesterday.  No h/o ovarian cyst.   Review of Systems  Constitutional: Positive for chills (1 episode yesterday). Negative for fever.  Gastrointestinal: Positive for nausea (1 episode yesterday), abdominal pain (1 episode yesterday) and diarrhea. Negative for vomiting and constipation.  Genitourinary: Positive for vaginal bleeding (small amount today only) and vaginal discharge. Negative for dysuria, urgency, frequency and vaginal pain.       Objective:   Physical Exam  Vitals reviewed. Constitutional: She appears well-developed and well-nourished.  HENT:  Head: Normocephalic and atraumatic.  Right Ear: External ear normal.  Left Ear: External ear normal.  Eyes: Conjunctivae normal are normal.  Neck: Neck supple.  Cardiovascular: Normal rate, regular rhythm and normal heart sounds.   No murmur heard. Pulmonary/Chest: Effort normal and breath sounds normal.  Abdominal: Soft. Bowel sounds are normal. There is no tenderness.  Genitourinary: Uterus normal. Pelvic exam was performed with patient supine. No labial fusion. There is no rash, tenderness, lesion or injury on the right labia. There is no rash, tenderness, lesion or injury on the left labia. Cervix exhibits discharge (blood from os).  Cervix exhibits no motion tenderness and no friability. Right adnexum displays no mass, no tenderness and no fullness. Left adnexum displays no mass, no tenderness and no fullness. No erythema, tenderness or bleeding around the vagina. No foreign body around the vagina. No signs of injury around the vagina. No vaginal discharge found.     Results for orders placed in visit on 04/10/12  POCT CBC      Component Value Range   WBC 8.9  4.6 - 10.2 K/uL   Lymph, poc 2.7  0.6 - 3.4   POC LYMPH PERCENT 30.1  10 - 50 %L   MID (cbc) 0.5  0 - 0.9   POC MID % 5.6  0 - 12 %M   POC Granulocyte 5.7  2 - 6.9   Granulocyte percent 64.3  37 - 80 %G   RBC 4.75  4.04 - 5.48 M/uL   Hemoglobin 11.7 (*) 12.2 - 16.2 g/dL   HCT, POC 21.3  08.6 - 47.9 %   MCV 82.6  80 - 97 fL   MCH, POC 24.6 (*) 27 - 31.2 pg   MCHC 29.8 (*) 31.8 - 35.4 g/dL   RDW, POC 57.8     Platelet Count, POC 347  142 - 424 K/uL   MPV 9.0  0 - 99.8 fL        Assessment & Plan:   1. Diarrhea  POCT CBC  2. H/O chlamydia infection  POCT CBC   1- most likely that patient is suffering from a GI bug and she will have spontaneous resolution.  Due to quick resolution of pain less likely abd pain resulting from ovarian cyst esp with normal non-tender pelvic exam.  Should not have PID due to treated chlamydia and no new contacts.  Pt will monitor and if pain returns she will return to clinic.  Pt has Rx for OCP - that she is scheduled to start taking at her next period.  I think she has started her menses early from the chlamydia probably and if she is bleeding tomorrow she should start her pills.    Answered patients questions and she understands and agrees with the above.

## 2012-05-29 ENCOUNTER — Encounter (HOSPITAL_COMMUNITY): Payer: Self-pay | Admitting: *Deleted

## 2012-05-29 ENCOUNTER — Emergency Department (HOSPITAL_COMMUNITY)
Admission: EM | Admit: 2012-05-29 | Discharge: 2012-05-30 | Disposition: A | Discharge status: Home / Self Care | Payer: Managed Care, Other (non HMO) | Attending: Emergency Medicine | Admitting: Emergency Medicine

## 2012-05-29 ENCOUNTER — Other Ambulatory Visit: Payer: Managed Care, Other (non HMO)

## 2012-05-29 DIAGNOSIS — R5381 Other malaise: Secondary | ICD-10-CM | POA: Insufficient documentation

## 2012-05-29 DIAGNOSIS — R42 Dizziness and giddiness: Secondary | ICD-10-CM | POA: Insufficient documentation

## 2012-05-29 DIAGNOSIS — Z3201 Encounter for pregnancy test, result positive: Secondary | ICD-10-CM | POA: Insufficient documentation

## 2012-05-29 DIAGNOSIS — R5383 Other fatigue: Secondary | ICD-10-CM | POA: Insufficient documentation

## 2012-05-29 DIAGNOSIS — Z862 Personal history of diseases of the blood and blood-forming organs and certain disorders involving the immune mechanism: Secondary | ICD-10-CM | POA: Insufficient documentation

## 2012-05-29 DIAGNOSIS — R110 Nausea: Secondary | ICD-10-CM | POA: Insufficient documentation

## 2012-05-29 DIAGNOSIS — R109 Unspecified abdominal pain: Secondary | ICD-10-CM | POA: Insufficient documentation

## 2012-05-29 DIAGNOSIS — N9489 Other specified conditions associated with female genital organs and menstrual cycle: Secondary | ICD-10-CM

## 2012-05-29 DIAGNOSIS — Z8719 Personal history of other diseases of the digestive system: Secondary | ICD-10-CM | POA: Insufficient documentation

## 2012-05-29 DIAGNOSIS — Z3490 Encounter for supervision of normal pregnancy, unspecified, unspecified trimester: Secondary | ICD-10-CM

## 2012-05-29 DIAGNOSIS — Z8659 Personal history of other mental and behavioral disorders: Secondary | ICD-10-CM | POA: Insufficient documentation

## 2012-05-29 DIAGNOSIS — Z8619 Personal history of other infectious and parasitic diseases: Secondary | ICD-10-CM | POA: Insufficient documentation

## 2012-05-29 DIAGNOSIS — N949 Unspecified condition associated with female genital organs and menstrual cycle: Secondary | ICD-10-CM | POA: Insufficient documentation

## 2012-05-29 DIAGNOSIS — Z8679 Personal history of other diseases of the circulatory system: Secondary | ICD-10-CM | POA: Insufficient documentation

## 2012-05-29 LAB — URINALYSIS, ROUTINE W REFLEX MICROSCOPIC
Bilirubin Urine: NEGATIVE
Hgb urine dipstick: NEGATIVE
Ketones, ur: NEGATIVE mg/dL
Protein, ur: NEGATIVE mg/dL
Urobilinogen, UA: 1 mg/dL (ref 0.0–1.0)

## 2012-05-29 LAB — CBC WITH DIFFERENTIAL/PLATELET
Eosinophils Relative: 1 % (ref 0–5)
HCT: 34.2 % — ABNORMAL LOW (ref 36.0–46.0)
Lymphocytes Relative: 25 % (ref 12–46)
Lymphs Abs: 2.5 10*3/uL (ref 0.7–4.0)
MCV: 78.4 fL (ref 78.0–100.0)
Monocytes Absolute: 0.7 10*3/uL (ref 0.1–1.0)
RBC: 4.36 MIL/uL (ref 3.87–5.11)
WBC: 10.2 10*3/uL (ref 4.0–10.5)

## 2012-05-29 LAB — COMPREHENSIVE METABOLIC PANEL
ALT: 12 U/L (ref 0–35)
CO2: 25 mEq/L (ref 19–32)
Calcium: 8.7 mg/dL (ref 8.4–10.5)
Chloride: 102 mEq/L (ref 96–112)
Creatinine, Ser: 0.57 mg/dL (ref 0.50–1.10)
GFR calc Af Amer: >90 mL/min (ref >90)
GFR calc non Af Amer: >90 mL/min (ref >90)
Glucose, Bld: 115 mg/dL — ABNORMAL HIGH (ref 70–99)
Sodium: 135 mEq/L (ref 135–145)
Total Bilirubin: 0.2 mg/dL — ABNORMAL LOW (ref 0.3–1.2)

## 2012-05-29 LAB — URINE MICROSCOPIC-ADD ON

## 2012-05-29 LAB — PREGNANCY, URINE: Preg Test, Ur: POSITIVE — AB

## 2012-05-29 NOTE — ED Notes (Signed)
Pt c/o fever and dizziness x 2 days; c/o abd pain x 2 days; denies vomiting; nausea; urinating without difficulty

## 2012-05-29 NOTE — ED Notes (Addendum)
Pt states that "It feels like period cramps.  I've been waiting to get my period since the end of last month." NAD noted, pt denies needs at this time

## 2012-05-30 ENCOUNTER — Emergency Department (HOSPITAL_COMMUNITY): Payer: Managed Care, Other (non HMO)

## 2012-05-30 LAB — WET PREP, GENITAL: Clue Cells Wet Prep HPF POC: NONE SEEN

## 2012-05-30 LAB — ABO/RH: ABO/RH(D): A POS

## 2012-05-30 MED ORDER — PRENATAL COMPLETE 14-0.4 MG PO TABS: 1.0000 | ORAL_TABLET | Freq: Every day | ORAL | Status: DC

## 2012-05-30 MED ORDER — NITROFURANTOIN MONOHYD MACRO 100 MG PO CAPS: 100.0000 mg | ORAL_CAPSULE | Freq: Two times a day (BID) | ORAL | Status: DC

## 2012-05-30 NOTE — ED Provider Notes (Signed)
Medical screening examination/treatment/procedure(s) were performed by non-physician practitioner and as supervising physician I was immediately available for consultation/collaboration.  April K Palumbo-Rasch, MD 05/30/12 0614 

## 2012-05-30 NOTE — ED Notes (Addendum)
Pelvic supplies at bedside. 

## 2012-05-30 NOTE — ED Provider Notes (Signed)
History     CSN: [id number]  Arrival date & time 05/29/12  5621   First MD Initiated Contact with Patient 05/30/12 0010      Chief Complaint  Patient presents with  . Fever  . Abdominal Pain   HPI  History provided by the patient. Patient is a 27 year old female with no significant PMH who presents with complaints of fatigue, subjective fevers, chills and lightheadedness for the past 2 days. Symptoms have also been associated with occasional lower abdomen and pelvic cramping pains. Today patient was nauseous with slight decreased appetite. She denies any episodes of vomiting. She has not used any medications for her symptoms. She denies having any diarrhea or constipation. Denies any dysuria, hematuria or urinary frequency. Denies any vaginal bleeding or discharge. Patient had last normal menstrual cycle at the end of January 1 part of February. She has not had her menstrual cycle this month. Patient is sexually active and does not regularly use barrier protection. She has prior history of chlamydia. Denies any similar symptoms today. No other aggravating or alleviating factors. No other associated symptoms.    Past Medical History  Diagnosis Date  . Anxiety   . GERD (gastroesophageal reflux disease)   . Chest pain     pt. seen at [facility] 07/12/2011, for chest pain, cleared medically, given  Lorazepam & prilosec.  Took Lorazepam & had good relief fr. it, has not taken Prilosec  . PONV (postoperative nausea and vomiting)   . Anemia     Past Surgical History  Procedure Laterality Date  . Lymph node biopsy  2010    left side neck  . Bone marrow harvest  2005  . Retinal detachment repair w/ scleral buckle le  07/17/11    right eye  . Scleral buckle  07/17/2011    Procedure: SCLERAL BUCKLE;  Surgeon: Sherrie George, MD;  Location: [facility] OR;  Service: Ophthalmology;  Laterality: Right;    Family History  Problem Relation Age of Onset  . Anesthesia problems Neg Hx      History  Substance Use Topics  . Smoking status: Never Smoker   . Smokeless tobacco: Never Used  . Alcohol Use: No    OB History   Grav Para Term Preterm Abortions TAB SAB Ect Mult Living                  Review of Systems  Constitutional: Positive for fever, chills and fatigue.  Respiratory: Negative for cough.   Cardiovascular: Negative for chest pain.  Gastrointestinal: Positive for nausea and abdominal pain. Negative for vomiting, diarrhea and constipation.  Genitourinary: Positive for pelvic pain. Negative for dysuria, frequency, hematuria, flank pain, vaginal bleeding and vaginal discharge.  Neurological: Positive for light-headedness. Negative for dizziness.  All other systems reviewed and are negative.    Allergies  Review of patient's allergies indicates no known allergies.  Home Medications  No current outpatient prescriptions on file.  BP 134/71  Pulse 94  Temp(Src) 99.2 F (37.3 C) (Oral)  Resp 18  Wt 128 lb 4 oz (58.174 kg)  BMI 24.25 kg/m2  SpO2 100%  LMP 04/10/2012  Physical Exam  Nursing note and vitals reviewed. Constitutional: She is oriented to person, place, and time. She appears well-developed and well-nourished. No distress.  HENT:  Head: Normocephalic.  Cardiovascular: Normal rate and regular rhythm.   No murmur heard. Pulmonary/Chest: Effort normal and breath sounds normal. No respiratory distress. She has no wheezes.  Abdominal: Soft. There  is tenderness in the right lower quadrant, suprapubic area and left lower quadrant. There is no rebound, no guarding, no CVA tenderness, no tenderness at McBurney's point and negative Murphy's sign.  Genitourinary:  Chaperone was present. Small amount of white vaginal discharge. Cervix closed. There is mild bilateral adnexal tenderness without mass.  Musculoskeletal: Normal range of motion.  Neurological: She is alert and oriented to person, place, and time.  Skin: Skin is warm and dry. No rash  noted.  Psychiatric: She has a normal mood and affect. Her behavior is normal.    ED Course  Procedures   Results for orders placed during the hospital encounter of 05/29/12  WET PREP, GENITAL      Result Value Range   Yeast Wet Prep HPF POC NONE SEEN  NONE SEEN   Trich, Wet Prep NONE SEEN  NONE SEEN   Clue Cells Wet Prep HPF POC NONE SEEN  NONE SEEN   WBC, Wet Prep HPF POC RARE (*) NONE SEEN  CBC WITH DIFFERENTIAL      Result Value Range   WBC 10.2  4.0 - 10.5 K/uL   RBC 4.36  3.87 - 5.11 MIL/uL   Hemoglobin 11.1 (*) 12.0 - 15.0 g/dL   HCT 16.1 (*) 09.6 - 04.5 %   MCV 78.4  78.0 - 100.0 fL   MCH 25.5 (*) 26.0 - 34.0 pg   MCHC 32.5  30.0 - 36.0 g/dL   RDW 40.9  81.1 - 91.4 %   Platelets 344  150 - 400 K/uL   Neutrophils Relative 67  43 - 77 %   Neutro Abs 6.8  1.7 - 7.7 K/uL   Lymphocytes Relative 25  12 - 46 %   Lymphs Abs 2.5  0.7 - 4.0 K/uL   Monocytes Relative 7  3 - 12 %   Monocytes Absolute 0.7  0.1 - 1.0 K/uL   Eosinophils Relative 1  0 - 5 %   Eosinophils Absolute 0.1  0.0 - 0.7 K/uL   Basophils Relative 0  0 - 1 %   Basophils Absolute 0.0  0.0 - 0.1 K/uL  COMPREHENSIVE METABOLIC PANEL      Result Value Range   Sodium 135  135 - 145 mEq/L   Potassium 3.7  3.5 - 5.1 mEq/L   Chloride 102  96 - 112 mEq/L   CO2 25  19 - 32 mEq/L   Glucose, Bld 115 (*) 70 - 99 mg/dL   BUN 10  6 - 23 mg/dL   Creatinine, Ser 7.82  0.50 - 1.10 mg/dL   Calcium 8.7  8.4 - 95.6 mg/dL   Total Protein 7.0  6.0 - 8.3 g/dL   Albumin 3.3 (*) 3.5 - 5.2 g/dL   AST 13  0 - 37 U/L   ALT 12  0 - 35 U/L   Alkaline Phosphatase 50  39 - 117 U/L   Total Bilirubin 0.2 (*) 0.3 - 1.2 mg/dL   GFR calc non Af Amer >90  >90 mL/min   GFR calc Af Amer >90  >90 mL/min  LIPASE, BLOOD      Result Value Range   Lipase 49  11 - 59 U/L  URINALYSIS, ROUTINE W REFLEX MICROSCOPIC      Result Value Range   Color, Urine YELLOW  YELLOW   APPearance CLEAR  CLEAR   Specific Gravity, Urine 1.024  1.005 - 1.030    pH 6.0  5.0 - 8.0   Glucose, UA NEGATIVE  NEGATIVE mg/dL   Hgb urine dipstick NEGATIVE  NEGATIVE   Bilirubin Urine NEGATIVE  NEGATIVE   Ketones, ur NEGATIVE  NEGATIVE mg/dL   Protein, ur NEGATIVE  NEGATIVE mg/dL   Urobilinogen, UA 1.0  0.0 - 1.0 mg/dL   Nitrite NEGATIVE  NEGATIVE   Leukocytes, UA SMALL (*) NEGATIVE  PREGNANCY, URINE      Result Value Range   Preg Test, Ur POSITIVE (*) NEGATIVE  URINE MICROSCOPIC-ADD ON      Result Value Range   Squamous Epithelial / LPF RARE  RARE   WBC, UA 3-6  <3 WBC/hpf   Urine-Other MUCOUS PRESENT    HCG, QUANTITATIVE, PREGNANCY      Result Value Range   hCG, Beta Chain, Quant, S 2117 (*) <5 mIU/mL  ABO/RH      Result Value Range   ABO/RH(D) A POS         US Ob Comp Less 14 Wks  05/30/2012  *RADIOLOGY REPORT*  Clinical Data: Abdominal pain  OBSTETRIC <14 WK [place] AND TRANSVAGINAL OB US  Technique:  Both transabdominal and transvaginal ultrasound examinations were performed for complete evaluation of the gestation as well as the maternal uterus, adnexal regions, and pelvic cul-de-sac.  Transvaginal technique was performed to assess early pregnancy.  Comparison:  None.  Intrauterine gestational sac:  Visualized/normal in shape. Yolk sac: Not identified Embryo: Not identified Cardiac Activity: Not applicable MSD: 4.4 mm  5 w 1 d [place] EDC: 01/29/2013  Maternal uterus/adnexae: No subchorionic hemorrhage.  Normal sonographic appearance to the ovaries.  Of 6.0 x 3.7 x 5.0 left para- ovarian echogenic mass with central hypervascularity.  This is nonspecific.  Small amount of free fluid.  IMPRESSION: Single intrauterine gestational sac.  No yolk sac or embryo visualized at this time, which may be due to the early timing of the examination.  The estimated age is 5 weeks 1 day by mean sac diameter.  Recommend correlation with serial quantitative beta HCG and ultrasound follow-up as warranted.  6 cm solid appearing heterogeneous/hyperechoic mass within the left  adnexa, with central vascularity. The etiology is indeterminate. Recommend MRI to further evaluate.  Discussed via telephone with Dr. Nicanor Alcon at 03:00 a.m. on 05/30/2012.   Original Report Authenticated By: Jearld Lesch, M.D.    US Ob Transvaginal  05/30/2012  *RADIOLOGY REPORT*  Clinical Data: Abdominal pain  OBSTETRIC <14 WK [place] AND TRANSVAGINAL OB US  Technique:  Both transabdominal and transvaginal ultrasound examinations were performed for complete evaluation of the gestation as well as the maternal uterus, adnexal regions, and pelvic cul-de-sac.  Transvaginal technique was performed to assess early pregnancy.  Comparison:  None.  Intrauterine gestational sac:  Visualized/normal in shape. Yolk sac: Not identified Embryo: Not identified Cardiac Activity: Not applicable MSD: 4.4 mm  5 w 1 d [place] EDC: 01/29/2013  Maternal uterus/adnexae: No subchorionic hemorrhage.  Normal sonographic appearance to the ovaries.  Of 6.0 x 3.7 x 5.0 left para- ovarian echogenic mass with central hypervascularity.  This is nonspecific.  Small amount of free fluid.  IMPRESSION: Single intrauterine gestational sac.  No yolk sac or embryo visualized at this time, which may be due to the early timing of the examination.  The estimated age is 5 weeks 1 day by mean sac diameter.  Recommend correlation with serial quantitative beta HCG and ultrasound follow-up as warranted.  6 cm solid appearing heterogeneous/hyperechoic mass within the left adnexa, with central vascularity. The etiology is indeterminate. Recommend MRI to further evaluate.  Discussed via telephone with Dr. Nicanor Alcon at 03:00 a.m. on 05/30/2012.   Original Report Authenticated By: Jearld Lesch, M.D.      1. Pregnancy   2. Adnexal mass       MDM  Patient seen and evaluated. Patient appears well in no acute distress. She has waxing waning pains without significant discomfort at this time. No reports of vaginal bleeding or discharge.  Patient offered pain  medications but has declined at this time.  Patient is found to have positive pregnancy. She reports last menstrual period at the end of January and first part of February. This may make possible gestation at most 4-5 weeks.  Patient has returned from ultrasound. Radiologist did discuss findings with attending physician. There is a 6 cm mass in the left adnexa area of undetermined origin. I discussed the findings with the patient and recommended close OB/GYN followup with possible MRI for further evaluation.  Patient, lab testing and ultrasound results were discussed with Dr. Vickey Sages on-call for Dr. Arelia Sneddon with OB/GYN. She will leave a message with the office to expect the patient to call later this morning at 8:30 to have close followup. He will plan to follow hCG levels and evaluate left adnexal mass.        Angus Seller, PA-C 05/30/12 [phone]

## 2012-05-30 NOTE — ED Notes (Signed)
Patient transported to Ultrasound 

## 2012-06-01 LAB — URINE CULTURE

## 2012-06-02 ENCOUNTER — Telehealth (HOSPITAL_COMMUNITY): Payer: Self-pay | Admitting: Emergency Medicine

## 2012-06-02 NOTE — ED Notes (Signed)
+  Urine. Patient treated with Macrobid. Sensitive to same. Per protocol MD. °

## 2012-06-02 NOTE — ED Notes (Signed)
Patient has +Urine culture. Checking to see if appropriately treated. °

## 2012-07-01 ENCOUNTER — Ambulatory Visit (INDEPENDENT_AMBULATORY_CARE_PROVIDER_SITE_OTHER): Payer: Managed Care, Other (non HMO) | Admitting: Family Medicine

## 2012-07-01 VITALS — BP 112/64 | HR 90 | Temp 98.6°F | Resp 16 | Ht 60.0 in | Wt 130.0 lb

## 2012-07-01 DIAGNOSIS — N898 Other specified noninflammatory disorders of vagina: Secondary | ICD-10-CM

## 2012-07-01 DIAGNOSIS — Z202 Contact with and (suspected) exposure to infections with a predominantly sexual mode of transmission: Secondary | ICD-10-CM | POA: Insufficient documentation

## 2012-07-01 DIAGNOSIS — Z2089 Contact with and (suspected) exposure to other communicable diseases: Secondary | ICD-10-CM

## 2012-07-01 HISTORY — DX: Other specified noninflammatory disorders of vagina: N89.8

## 2012-07-01 MED ORDER — CEFTRIAXONE SODIUM 1 G IJ SOLR
250.0000 mg | INTRAMUSCULAR | Status: DC
  Administered 2012-07-01: 250 mg via INTRAMUSCULAR

## 2012-07-01 MED ORDER — AZITHROMYCIN 250 MG PO TABS: ORAL_TABLET | ORAL | Status: DC

## 2012-07-01 NOTE — Patient Instructions (Addendum)
Thank you for coming in today. Take the pills as directed.  We will call you with your results.  Come back as needed.

## 2012-07-01 NOTE — Progress Notes (Signed)
Nichole Davis is a 27 y.o. female who presents to [facility] today for vaginal discharge with STD exposure. Patient's boyfriend was recently diagnosed with Chlamydia. Over the last several weeks she's noted vaginal discharge without abdominal pain fevers chills or diarrhea. She had a recent elective abortion in March 8. She's currently using contraception and last week. About one week ago. She denies any fevers or chills and feels well otherwise.   PMH: Reviewed history of retinal detachment History  Substance Use Topics  . Smoking status: Never Smoker   . Smokeless tobacco: Never Used  . Alcohol Use: Yes   ROS as above  Medications reviewed. Current Outpatient Prescriptions  Medication Sig Dispense Refill  . norethindrone-ethinyl estradiol (JUNEL FE,GILDESS FE,LOESTRIN FE) 1-20 MG-MCG tablet Take 1 tablet by mouth daily.      Marland Kitchen azithromycin (ZITHROMAX) 250 MG tablet Take 4 tablets once.  6 each  0  . nitrofurantoin, macrocrystal-monohydrate, (MACROBID) 100 MG capsule Take 1 capsule (100 mg total) by mouth 2 (two) times daily. X 7 days  14 capsule  0  . Prenatal Vit-Fe Fumarate-FA (PRENATAL COMPLETE) 14-0.4 MG TABS Take 1 tablet by mouth daily.  60 each  0   Current Facility-Administered Medications  Medication Dose Route Frequency Provider Last Rate Last Dose  . cefTRIAXone (ROCEPHIN) injection 250 mg  250 mg Intramuscular Q24H Rodolph Bong, MD        Exam:  BP 112/64  Pulse 90  Temp(Src) 98.6 F (37 C) (Oral)  Resp 16  Ht 5' (1.524 m)  Wt 130 lb (58.968 kg)  BMI 25.39 kg/m2  SpO2 100%  LMP 06/26/2012 Gen: Well NAD Lungs: CTABL Nl WOB Heart: RRR no MRG Abd: NABS, NT, ND   No results found for this or any previous visit (from the past 72 hour(s)).  Assessment and Plan: 27 y.o. female with STD exposure. Plan to treat empirically with ceftriaxone and azithromycin. Urine cytology probe for gonorrhea and Chlamydia.  Followup as needed

## 2012-07-02 LAB — GC/CHLAMYDIA PROBE AMP
CT Probe RNA: POSITIVE — AB
GC Probe RNA: NEGATIVE

## 2012-07-03 ENCOUNTER — Telehealth: Payer: Self-pay | Admitting: Family Medicine

## 2012-07-03 ENCOUNTER — Telehealth: Payer: Self-pay

## 2012-07-03 DIAGNOSIS — N898 Other specified noninflammatory disorders of vagina: Secondary | ICD-10-CM

## 2012-07-03 MED ORDER — AZITHROMYCIN 250 MG PO TABS: ORAL_TABLET | ORAL | Status: DC

## 2012-07-03 NOTE — Telephone Encounter (Signed)
She did not get the Zithromax, it  Is sent to [facility] [address] for her.

## 2012-07-03 NOTE — Telephone Encounter (Signed)
Called cell number and left a message asking her to call [place] back. Chlamydia test was positive. Patient was prescribed azithromycin. This is to ensure that she is taking her medications and she will followup in one month for confirmatory negative testing.

## 2012-07-03 NOTE — Telephone Encounter (Signed)
Pt was called by Dr. Denyse Amass and and was told to call back and speak to a nurse. A call back number is [phone].

## 2012-07-24 ENCOUNTER — Telehealth: Payer: Self-pay

## 2012-07-24 NOTE — Telephone Encounter (Signed)
Pt needs call back to discuss prior prescription given.  [phone]

## 2012-07-25 NOTE — Telephone Encounter (Signed)
Patient wants another Rx sent in for her. It has already been resent for her once. She states wants sent to different pharmacy, advised her to have this transferred she will do this. She also is advised she is due for follow up regarding her chlamydia infection.

## 2012-07-25 NOTE — Telephone Encounter (Signed)
Called her.  Called cell number and left a message asking her to call Pamona back. Chlamydia test was positive. Patient was prescribed azithromycin. This is to ensure that she is taking her medications and she will followup in one month for confirmatory negative testing.        Patient is due for follow up for repeat testing for chlamydia. called her unable to leave message.

## 2012-07-27 ENCOUNTER — Ambulatory Visit (INDEPENDENT_AMBULATORY_CARE_PROVIDER_SITE_OTHER): Payer: Managed Care, Other (non HMO) | Admitting: Physician Assistant

## 2012-07-27 VITALS — BP 110/68 | HR 81 | Temp 98.5°F | Resp 18 | Ht 62.0 in | Wt 129.0 lb

## 2012-07-27 DIAGNOSIS — B3731 Acute candidiasis of vulva and vagina: Secondary | ICD-10-CM

## 2012-07-27 DIAGNOSIS — B373 Candidiasis of vulva and vagina: Secondary | ICD-10-CM

## 2012-07-27 DIAGNOSIS — N898 Other specified noninflammatory disorders of vagina: Secondary | ICD-10-CM

## 2012-07-27 DIAGNOSIS — A749 Chlamydial infection, unspecified: Secondary | ICD-10-CM

## 2012-07-27 DIAGNOSIS — N760 Acute vaginitis: Secondary | ICD-10-CM

## 2012-07-27 DIAGNOSIS — B9689 Other specified bacterial agents as the cause of diseases classified elsewhere: Secondary | ICD-10-CM

## 2012-07-27 DIAGNOSIS — N739 Female pelvic inflammatory disease, unspecified: Secondary | ICD-10-CM

## 2012-07-27 LAB — POCT WET PREP WITH KOH: Epithelial Wet Prep HPF POC: NEGATIVE

## 2012-07-27 MED ORDER — METRONIDAZOLE 500 MG PO TABS: 500.0000 mg | ORAL_TABLET | Freq: Two times a day (BID) | ORAL | Status: DC

## 2012-07-27 MED ORDER — FLUCONAZOLE 150 MG PO TABS: 150.0000 mg | ORAL_TABLET | Freq: Once | ORAL | Status: DC

## 2012-07-27 MED ORDER — DOXYCYCLINE HYCLATE 100 MG PO CAPS: 100.0000 mg | ORAL_CAPSULE | Freq: Two times a day (BID) | ORAL | Status: DC

## 2012-07-27 MED ORDER — CEFTRIAXONE SODIUM 1 G IJ SOLR
250.0000 mg | Freq: Once | INTRAMUSCULAR | Status: AC
  Administered 2012-07-27: 250 mg via INTRAMUSCULAR

## 2012-07-27 NOTE — Patient Instructions (Addendum)
We have given you an injection of antibiotics today.  Begin taking the doxycycline twice a day for 14 days - this needs to be taken either 2 hours before or 3 hours after your birth control, as the iron component of your pill can interfere with medication absorption.  Begin taking the metronidazole twice a day for 14 days - DO NOT CONSUME ALCOHOL with this medication or for 48 hours after your last dose.  Take one fluconazole pill today and take the other one the day after you finish the doxycycline and metronidazole.  Make sure that your partner is retested as he may have been re-exposed.  Abstain from sexual intercourse until you have completed treatment.  In the future, use condoms with every sexual encounter - this is the best way to protect yourself from infection.  If any symptoms are worsening or not improving, please let me know.   Pelvic Inflammatory Disease Pelvic inflammatory disease (PID) refers to an infection in some or all of the female organs. The infection can be in the uterus, ovaries, fallopian tubes, or the surrounding tissues in the pelvis. PID can cause abdominal or pelvic pain that comes on suddenly (acute pelvic pain). PID is a serious infection because it can lead to lasting (chronic) pelvic pain or the inability to have children (infertile).  CAUSES  The infection is often caused by the normal bacteria found in the vaginal tissues. PID may also be caused by an infection that is spread during sexual contact. PID can also occur following:   The birth of a baby.   A miscarriage.   An abortion.   Major pelvic surgery.   The use of an intrauterine device (IUD).   A sexual assault.  RISK FACTORS Certain factors can put a person at higher risk for PID, such as:  Being younger than 25 years.  Being sexually active at [place] age.  Usingnonbarrier contraception.  Havingmultiple sexual partners.  Having sex with someone who has symptoms of a genital  infection.  Using oral contraception. Other times, certain behaviors can increase the possibility of getting PID, such as:  Having sex during your period.  Using a vaginal douche.  Having an intrauterine device (IUD) in place. SYMPTOMS   Abdominal or pelvic pain.   Fever.   Chills.   Abnormal vaginal discharge.  Abnormal uterine bleeding.   Unusual pain shortly after finishing your period. DIAGNOSIS  Your caregiver will choose some of the following methods to make a diagnosis, such as:   Performinga physical exam and history. A pelvic exam typically reveals a very tender uterus and surrounding pelvis.   Ordering laboratory tests including a pregnancy test, blood tests, and urine test.  Orderingcultures of the vagina and cervix to check for a sexually transmitted infection (STI).  Performing an ultrasound.   Performing a laparoscopic procedure to look inside the pelvis.  TREATMENT   Antibiotic medicines may be prescribed and taken by mouth.   Sexual partners may be treated when the infection is caused by a sexually transmitted disease (STD).   Hospitalization may be needed to give antibiotics intravenously.  Surgery may be needed, but this is rare. It may take weeks until you are completely well. If you are diagnosed with PID, you should also be checked for human immunodeficiency virus (HIV). HOME CARE INSTRUCTIONS   If given, take your antibiotics as directed. Finish the medicine even if you start to feel better.   Only take over-the-counter or prescription medicines for pain,  discomfort, or fever as directed by your caregiver.   Do not have sexual intercourse until treatment is completed or as directed by your caregiver. If PID is confirmed, your recent sexual partner(s) will need treatment.   Keep your follow-up appointments. SEEK MEDICAL CARE IF:   You have increased or abnormal vaginal discharge.   You need prescription medicine for your  pain.   You vomit.   You cannot take your medicines.   Your partner has an STD.  SEEK IMMEDIATE MEDICAL CARE IF:   You have a fever.   You have increased abdominal or pelvic pain.   You have chills.   You have pain when you urinate.   You are not better after 72 hours following treatment.  MAKE SURE YOU:   Understand these instructions.  Will watch your condition.  Will get help right away if you are not doing well or get worse. Document Released: 03/05/2005 Document Revised: 05/28/2011 Document Reviewed: 03/01/2011 [facility] Patient Information 2013 [place], [place].

## 2012-07-27 NOTE — Progress Notes (Addendum)
Subjective:    Patient ID: Nichole Davis, female    DOB: 09-18-1985, 27 y.o.   MRN: [id number]  HPI   Nichole Davis is a 27 yr old female here with concern for vaginal discharge.  Pt was here approx 1 month ago after exposure to chlamydia.  Was treated empirically with ceftriaxone and prescribed azithromycin.  Pt forgot to pick rx up.  Labs returned positive for chlamydia, pt was notified and at that time said that meds had been sent to the wrong pharmacy.  Meds were sent to the correct pharmacy.  Pt called again requesting yet another pharmacy, states she was told we could not send in again for her.  Telephone encounter indicates that pt was told to have the pharmacy transfer the prescription.  It has now been 1 month and pt has not been treated for chlamydia.  Today pt states she continues to have vaginal discharge, worse in the last three days.  Describes the discharge as white and cloudy yesterday, today more watery and yellow.  Associated with vaginal itching as well.  Has had both BV and yeast in the past but not sure when.  Denies urinary symptoms.  Does endorse some lower abd pain and nausea, no vomiting.  Denies fevers or chills.  Last intercourse a couple weeks ago.  Currently has 1 female sexual partner.  They do not use condoms.  Partner tested pos for chlamydia 1 month ago which is what prompted pt's initial visit.  She thinks he has been treated.  LMP 07/08/12, using OCPs for contraception, states she takes daily at 9am.  Of note pt underwent elective abortion in March 2014  Review of Systems  Constitutional: Negative for fever and chills.  HENT: Negative.   Respiratory: Negative.   Cardiovascular: Negative.   Gastrointestinal: Positive for nausea and abdominal pain (lower). Negative for vomiting.  Genitourinary: Positive for vaginal discharge. Negative for dysuria, urgency, frequency, hematuria, flank pain, genital sores, menstrual problem, pelvic pain and dyspareunia.  Musculoskeletal:  Negative.   Neurological: Negative.        Objective:   Physical Exam  Vitals reviewed. Constitutional: She is oriented to person, place, and time. She appears well-developed and well-nourished. No distress.  HENT:  Head: Normocephalic and atraumatic.  Eyes: Conjunctivae are normal. No scleral icterus.  Cardiovascular: Normal rate, regular rhythm and normal heart sounds.  Exam reveals no gallop and no friction rub.   No murmur heard. Pulmonary/Chest: Effort normal and breath sounds normal. She has no wheezes. She has no rales.  Abdominal: Soft. Normal appearance and bowel sounds are normal. There is tenderness in the right upper quadrant. There is no rebound, no guarding and no CVA tenderness.  Genitourinary: Uterus normal. There is no rash, tenderness or lesion on the right labia. There is no rash, tenderness or lesion on the left labia. Cervix exhibits motion tenderness and discharge. Right adnexum displays no mass, no tenderness and no fullness. Left adnexum displays no mass, no tenderness and no fullness. Vaginal discharge (copious thin, watery yellow discharge mised with think white clumps) found.  Neurological: She is alert and oriented to person, place, and time.  Skin: Skin is warm and dry.  Psychiatric: She has a normal mood and affect. Her behavior is normal.     Results for orders placed in visit on 07/27/12  POCT WET PREP WITH KOH      Result Value Range   Trichomonas, UA Negative     Clue Cells Wet Prep HPF  POC tntc     Epithelial Wet Prep HPF POC neg     Yeast Wet Prep HPF POC pos     Bacteria Wet Prep HPF POC 5+     RBC Wet Prep HPF POC 3-7     WBC Wet Prep HPF POC tntc     KOH Prep POC Positive          Assessment & Plan:  PID (pelvic inflammatory disease) - Plan: cefTRIAXone (ROCEPHIN) injection 250 mg, doxycycline (VIBRAMYCIN) 100 MG capsule, metroNIDAZOLE (FLAGYL) 500 MG tablet  -- 27 yr old female with 1 month history of untreated chlamydia, also with  history of abortion approx 8 weeks ago.  Today pt with copious vaginal discharge on exam, also with CMT.  Abd pain and nausea by history.  Wet prep indicates bacterial vaginosis and candidiasis.  I am concerned that she has now developed pelvic inflammatory disease.  Discussed my concerns with pt and the importance of treatment.  Ceftriaxone given in clinic.  Start doxy and metronidazole today.  Pt needs to separate doxy dosing from OCPs given the iron component in her pill.  She expresses understanding of this.    Chlamydia infection - Plan: doxycycline (VIBRAMYCIN) 100 MG capsule  -- Genprobe collected.  Doxycycline BID x 14 days.  Bacterial vaginosis - Plan: metroNIDAZOLE (FLAGYL) 500 MG tablet  -- Metronidazole BID x 14 days.  Discussed avoidance of etoh.  Vaginal candidiasis - Plan: fluconazole (DIFLUCAN) 150 MG tablet  -- Fluconazole x 1 today, then repeat 1-2 after she finishes doxy and flagyl.  Vaginal discharge - Plan: POCT Wet Prep with KOH, GC/Chlamydia Probe Amp  Pt declines HIV, RPR testing today.  Instructed pt to discuss with partner that he needs to be re-tested, possibly re-treated for chlamydia as they have been having unprotected sex while she was actively infected with chlamydia.  Encouraged her to abstain from sexual intercourse until after completion of abx.  Discussed the importance of condom use.  If symptoms worsening or she is not seeing improvement in symptoms within 48-72 hours, instructed pt to RTC.

## 2012-07-28 LAB — GC/CHLAMYDIA PROBE AMP: GC Probe RNA: NEGATIVE

## 2012-07-30 ENCOUNTER — Telehealth: Payer: Self-pay | Admitting: Physician Assistant

## 2012-07-30 NOTE — Telephone Encounter (Signed)
Called pt to see how she is doing and make sure she is taking the abx.  Phone rang multiple times, no answer, no voicemail

## 2012-07-30 NOTE — Telephone Encounter (Signed)
Pt returned phone call, states she is feeling much better.  Taking all meds as prescribed and tolerating them well.  If still with symptoms after abx she will RTC.  Pt appreciates the phone call

## 2012-08-04 ENCOUNTER — Ambulatory Visit (INDEPENDENT_AMBULATORY_CARE_PROVIDER_SITE_OTHER): Payer: Managed Care, Other (non HMO) | Admitting: Ophthalmology

## 2012-08-22 ENCOUNTER — Telehealth: Payer: Self-pay

## 2012-08-22 MED ORDER — FLUCONAZOLE 150 MG PO TABS: 150.0000 mg | ORAL_TABLET | Freq: Once | ORAL | Status: DC

## 2012-08-22 NOTE — Telephone Encounter (Signed)
Rx sent to pharmacy. Needs to RTC if symptoms persist after treatment

## 2012-08-22 NOTE — Telephone Encounter (Signed)
Spoke with pt she is having symptoms of yeast infection now after finishing antibiotics. Please advise.

## 2012-08-22 NOTE — Telephone Encounter (Signed)
Patient took the fluconazole (DIFLUCAN) 150 MG tablet Before she started the antibiotics.   Can she get another script. [facility] on [address]?  [phone]

## 2013-01-12 ENCOUNTER — Emergency Department (HOSPITAL_COMMUNITY)
Admission: EM | Admit: 2013-01-12 | Discharge: 2013-01-12 | Disposition: A | Discharge status: Home / Self Care | Payer: Managed Care, Other (non HMO) | Attending: Emergency Medicine | Admitting: Emergency Medicine

## 2013-01-12 ENCOUNTER — Encounter (HOSPITAL_COMMUNITY): Payer: Self-pay | Admitting: Emergency Medicine

## 2013-01-12 DIAGNOSIS — Z8659 Personal history of other mental and behavioral disorders: Secondary | ICD-10-CM | POA: Insufficient documentation

## 2013-01-12 DIAGNOSIS — Z79899 Other long term (current) drug therapy: Secondary | ICD-10-CM | POA: Insufficient documentation

## 2013-01-12 DIAGNOSIS — Z792 Long term (current) use of antibiotics: Secondary | ICD-10-CM | POA: Insufficient documentation

## 2013-01-12 DIAGNOSIS — Z862 Personal history of diseases of the blood and blood-forming organs and certain disorders involving the immune mechanism: Secondary | ICD-10-CM | POA: Insufficient documentation

## 2013-01-12 DIAGNOSIS — Z8719 Personal history of other diseases of the digestive system: Secondary | ICD-10-CM | POA: Insufficient documentation

## 2013-01-12 DIAGNOSIS — J029 Acute pharyngitis, unspecified: Secondary | ICD-10-CM

## 2013-01-12 LAB — RAPID STREP SCREEN (MED CTR MEBANE ONLY): Streptococcus, Group A Screen (Direct): NEGATIVE

## 2013-01-12 NOTE — ED Notes (Signed)
Pt presents with c/o sore throat that started yesterday. Pt says this morning she noticed she was having trouble swallowing. Pt is also c/o aching in her legs and says that she also feels pain in her sinus cavities.

## 2013-01-12 NOTE — ED Provider Notes (Signed)
CSN: [id number]     Arrival date & time 01/12/13  1827 History  This chart was scribed for non-physician practitioner Santiago Glad, PA-C working with Suzi Roots, MD by Joaquin Music, ED Scribe. This patient was seen in room WTR6/WTR6 and the patient's care was started at 7:02 PM .      Chief Complaint  Patient presents with  . Sore Throat    The history is provided by the patient. No language interpreter was used.   HPI Comments: Nichole Davis is a 27 y.o. female who presents to the Emergency Department complaining of ongoing worsening sore throat onset 24 hours ago. Pt states she has been having pain with swallowing, but has been able to swallow. Pt states she has felt she had a fever but denies taking a temperature. Pt states she had sinus pressure but denies any current pressure. Denies cough, SOB, nasal congestion, or post nasal drip.  Pt denies any sick contact. She state she has been using OTC throat spray with mild relief.    Past Medical History  Diagnosis Date  . Anxiety   . GERD (gastroesophageal reflux disease)   . Chest pain     pt. seen at [facility] 07/12/2011, for chest pain, cleared medically, given  Lorazepam & prilosec.  Took Lorazepam & had good relief fr. it, has not taken Prilosec  . PONV (postoperative nausea and vomiting)   . Anemia    Past Surgical History  Procedure Laterality Date  . Lymph node biopsy  2010    left side neck  . Bone marrow harvest  2005  . Retinal detachment repair w/ scleral buckle le  07/17/11    right eye  . Scleral buckle  07/17/2011    Procedure: SCLERAL BUCKLE;  Surgeon: Sherrie George, MD;  Location: [facility] OR;  Service: Ophthalmology;  Laterality: Right;  . Eye surgery     Family History  Problem Relation Age of Onset  . Anesthesia problems Neg Hx   . Cancer Brother    History  Substance Use Topics  . Smoking status: Never Smoker   . Smokeless tobacco: Never Used  . Alcohol Use: Yes   OB History    Grav Para Term Preterm Abortions TAB SAB Ect Mult Living                 Review of Systems A complete 10 system review of systems was obtained and all systems are negative except as noted in the HPI and PMH.   Allergies  Review of patient's allergies indicates no known allergies.  Home Medications   Current Outpatient Rx  Name  Route  Sig  Dispense  Refill  . doxycycline (VIBRAMYCIN) 100 MG capsule   Oral   Take 1 capsule (100 mg total) by mouth 2 (two) times daily.   28 capsule   0   . fluconazole (DIFLUCAN) 150 MG tablet   Oral   Take 1 tablet (150 mg total) by mouth once. Repeat the day after your finish the antibiotics.   2 tablet   0   . fluconazole (DIFLUCAN) 150 MG tablet   Oral   Take 1 tablet (150 mg total) by mouth once. Repeat if needed   2 tablet   0   . metroNIDAZOLE (FLAGYL) 500 MG tablet   Oral   Take 1 tablet (500 mg total) by mouth 2 (two) times daily with a meal. DO NOT CONSUME ALCOHOL WHILE TAKING THIS MEDICATION.  28 tablet   0   . norethindrone-ethinyl estradiol (JUNEL FE,GILDESS FE,LOESTRIN FE) 1-20 MG-MCG tablet   Oral   Take 1 tablet by mouth daily.          Triage Vitals: BP 117/76  Pulse 101  Temp(Src) 99.4 F (37.4 C) (Oral)  Resp 14  Ht 5\' 1"  (1.549 m)  Wt 133 lb (60.328 kg)  BMI 25.14 kg/m2  SpO2 100%  LMP 11/23/2012  Physical Exam  Nursing note and vitals reviewed. Constitutional: She is oriented to person, place, and time. She appears well-developed and well-nourished. No distress.  HENT:  Head: Normocephalic and atraumatic.  Mouth/Throat: No trismus in the jaw. No uvula swelling. No tonsillar abscesses.  Bilateral tonsillar enlargement. Uvula midline. No oropharynx exudate. Mild erythema of oropharynx.  Mild anterior lymphadenopathy. Pt drinking water without difficulty. Normal voice phonation.   Eyes: EOM are normal.  Neck: Neck supple. No tracheal deviation present.  Cardiovascular: Normal rate.   Pulmonary/Chest:  Effort normal. No respiratory distress.  Musculoskeletal: Normal range of motion.  Neurological: She is alert and oriented to person, place, and time.  Skin: Skin is warm and dry. She is not diaphoretic.  Psychiatric: She has a normal mood and affect. Her behavior is normal.    ED Course  Procedures  DIAGNOSTIC STUDIES: Oxygen Saturation is 100% on RA, normal by my interpretation.    COORDINATION OF CARE: 7:08 PM-Discussed treatment plan which includes rapid strep test. Pt agreed to plan.   7:36 PM-Discussed lab findings with pt. Will D/C pt with Ibuprofen.  Labs Review Labs Reviewed  RAPID STREP SCREEN  CULTURE, GROUP A STREP   Imaging Review No results found.  EKG Interpretation   None       MDM  No diagnosis found. Patient presenting with a sore throat.  Rapid strep negative.  No signs of peritonsillar abscess.  Patient stable for discharge.  Return precautions given.   I personally performed the services described in this documentation, which was scribed in my presence. The recorded information has been reviewed and is accurate.    Santiago Glad, PA-C 01/12/13 2025

## 2013-01-13 NOTE — ED Provider Notes (Signed)
Medical screening examination/treatment/procedure(s) were performed by non-physician practitioner and as supervising physician I was immediately available for consultation/collaboration.  EKG Interpretation   None         Suzi Roots, MD 01/13/13 1246

## 2013-01-14 LAB — CULTURE, GROUP A STREP

## 2013-01-22 ENCOUNTER — Other Ambulatory Visit: Payer: Self-pay

## 2013-03-09 ENCOUNTER — Encounter (INDEPENDENT_AMBULATORY_CARE_PROVIDER_SITE_OTHER): Payer: Managed Care, Other (non HMO) | Admitting: Ophthalmology

## 2013-03-09 DIAGNOSIS — H33009 Unspecified retinal detachment with retinal break, unspecified eye: Secondary | ICD-10-CM

## 2013-03-09 DIAGNOSIS — H33309 Unspecified retinal break, unspecified eye: Secondary | ICD-10-CM

## 2013-03-09 DIAGNOSIS — H43819 Vitreous degeneration, unspecified eye: Secondary | ICD-10-CM

## 2013-03-10 ENCOUNTER — Encounter (INDEPENDENT_AMBULATORY_CARE_PROVIDER_SITE_OTHER): Payer: Managed Care, Other (non HMO) | Admitting: Ophthalmology

## 2013-03-15 ENCOUNTER — Ambulatory Visit (INDEPENDENT_AMBULATORY_CARE_PROVIDER_SITE_OTHER): Payer: Managed Care, Other (non HMO) | Admitting: Emergency Medicine

## 2013-03-15 VITALS — BP 100/56 | HR 85 | Temp 98.8°F | Resp 16 | Ht 62.5 in | Wt 131.0 lb

## 2013-03-15 DIAGNOSIS — J09X2 Influenza due to identified novel influenza A virus with other respiratory manifestations: Secondary | ICD-10-CM

## 2013-03-15 DIAGNOSIS — G43109 Migraine with aura, not intractable, without status migrainosus: Secondary | ICD-10-CM

## 2013-03-15 MED ORDER — BUTALBITAL-APAP-CAFFEINE 50-325-40 MG PO TABS: 1.0000 | ORAL_TABLET | Freq: Four times a day (QID) | ORAL | Status: DC | PRN

## 2013-03-15 MED ORDER — PROMETHAZINE-CODEINE 6.25-10 MG/5ML PO SYRP: 5.0000 mL | ORAL_SOLUTION | Freq: Four times a day (QID) | ORAL | Status: DC | PRN

## 2013-03-15 NOTE — Patient Instructions (Signed)

## 2013-03-15 NOTE — Progress Notes (Signed)
[facility] [address] [place] [address] [phone]  Date:  03/15/2013   Name:  Nichole Davis   DOB:  [birth year]   MRN:  [id number]  PCP:  No primary provider on file.    Chief Complaint: Fever, Headache, Generalized Body Aches, Sore Throat and Cough   History of Present Illness:  Nichole Davis is a 27 y.o. very pleasant female patient who presents with the following:  Ill since Wednesday after exposure to four ill coworkers. Has fever and chills, non productive cough.  Sore throat, muscle aches and malaise.  Developed headaches late in the week that are worse than her usual migraines.  Unable to work the end of the week due illness.  Saw her ophthalmologist and found no evidence of retinal detachment.  No neuro or visual symptoms.  Says has pain and pressure over maxillary sinuses. No improvement with over the counter medications or other home remedies. Denies other complaint or health concern today.   Patient Active Problem List   Diagnosis Date Noted  . Vaginal discharge 07/01/2012  . Exposure to STD 07/01/2012  . 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 Anxiety   . GERD (gastroesophageal reflux disease)   . Chest pain     pt. seen at [facility] 07/12/2011, for chest pain, cleared medically, given  Lorazepam & prilosec.  Took Lorazepam & had good relief fr. it, has not taken Prilosec  . PONV (postoperative nausea and vomiting)   . Anemia     Past Surgical History  Procedure Laterality Date  . Lymph node biopsy  2010    left side neck  . Bone marrow harvest  2005  . Retinal detachment repair w/ scleral buckle le  07/17/11    right eye  . Scleral buckle  07/17/2011    Procedure: SCLERAL BUCKLE;  Surgeon: Sherrie George, MD;  Location: [facility] OR;  Service: Ophthalmology;  Laterality: Right;  . Eye surgery      History  Substance Use Topics  . Smoking status: Never Smoker   .  Smokeless tobacco: Never Used  . Alcohol Use: Yes     Comment: socially     Family History  Problem Relation Age of Onset  . Anesthesia problems Neg Hx   . Cancer Brother     No Known Allergies  Medication list has been reviewed and updated.  No current outpatient prescriptions on file prior to visit.   No current facility-administered medications on file prior to visit.    Review of Systems:  As per HPI, otherwise negative.    Physical Examination: Filed Vitals:   03/15/13 1055  BP: 100/56  Pulse: 85  Temp: 98.8 F (37.1 C)  Resp: 16   Filed Vitals:   03/15/13 1055  Height: 5' 2.5" (1.588 m)  Weight: 131 lb (59.421 kg)   Body mass index is 23.56 kg/(m^2). Ideal Body Weight: Weight in (lb) to have BMI = 25: 138.6  GEN: WDWN, NAD, Non-toxic, A & O x 3 HEENT: Atraumatic, Normocephalic. Neck supple. No masses, No LAD.  Fundi benign Ears and Nose: No external deformity. CV: RRR, No M/G/R. No JVD. No thrill. No extra heart sounds. PULM: CTA B, no wheezes, crackles, rhonchi. No retractions. No resp. distress. No accessory muscle use. ABD: S, NT, ND, +BS. No rebound. No HSM. EXTR: No c/c/e NEURO Normal gait.  PSYCH: Normally interactive. Conversant.  Not depressed or anxious appearing.  Calm demeanor.    Assessment and Plan: Influenza Migraine Too late for tamiflu Phen c cod fioricet Follow up as needed  Signed,  Phillips Odor, MD

## 2013-08-21 ENCOUNTER — Ambulatory Visit (INDEPENDENT_AMBULATORY_CARE_PROVIDER_SITE_OTHER): Payer: BC Managed Care – PPO | Admitting: Certified Nurse Midwife

## 2013-08-21 ENCOUNTER — Encounter: Payer: Self-pay | Admitting: Certified Nurse Midwife

## 2013-08-21 VITALS — BP 90/60 | HR 64 | Resp 16 | Ht 60.75 in | Wt 137.0 lb

## 2013-08-21 DIAGNOSIS — Z0000 Encounter for general adult medical examination without abnormal findings: Secondary | ICD-10-CM

## 2013-08-21 DIAGNOSIS — Z01419 Encounter for gynecological examination (general) (routine) without abnormal findings: Secondary | ICD-10-CM

## 2013-08-21 DIAGNOSIS — N898 Other specified noninflammatory disorders of vagina: Secondary | ICD-10-CM

## 2013-08-21 DIAGNOSIS — Z124 Encounter for screening for malignant neoplasm of cervix: Secondary | ICD-10-CM

## 2013-08-21 DIAGNOSIS — D126 Benign neoplasm of colon, unspecified: Secondary | ICD-10-CM | POA: Insufficient documentation

## 2013-08-21 DIAGNOSIS — N9489 Other specified conditions associated with female genital organs and menstrual cycle: Secondary | ICD-10-CM

## 2013-08-21 LAB — POCT URINALYSIS DIPSTICK
BILIRUBIN UA: NEGATIVE
Blood, UA: NEGATIVE
Glucose, UA: NEGATIVE
Ketones, UA: NEGATIVE
LEUKOCYTES UA: NEGATIVE
NITRITE UA: NEGATIVE
PH UA: 5
PROTEIN UA: NEGATIVE
Urobilinogen, UA: NEGATIVE

## 2013-08-21 NOTE — Progress Notes (Signed)
28 y.o. G2P0020 Single Hispanic Fe here re-establishing care, no aex except since 2011 here for annual exam. Periods normal prior to 4/15. Periods twice monthly light mid cycle and then normal at menses time. Patient feels she has a BV infection with increase vaginal discharge and odor for the past two days. STD screening desired No new personal products.. Condoms working well for contraception. Patient Sees Urgent care prn.  Patient's last menstrual period was 08/12/2013.          Sexually active: yes  The current method of family planning is condoms most of the time.    Exercising: no  exercise Smoker:  no  Health Maintenance: Pap:  01-05-10 neg MMG:  none Colonoscopy:  none BMD:   none TDaP: 2007 Labs: Hgb-11.4, Poct urine-neg Self breast exam: done occ   reports that she has never smoked. She has never used smokeless tobacco. She reports that she does not drink alcohol or use illicit drugs.  Past Medical History  Diagnosis Date  . Anxiety   . GERD (gastroesophageal reflux disease)   . Chest pain     pt. seen at [facility] 07/12/2011, for chest pain, cleared medically, given  Lorazepam & prilosec.  Took Lorazepam & had good relief fr. it, has not taken Prilosec  . PONV (postoperative nausea and vomiting)   . Anemia   . 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 STD (sexually transmitted disease)     trichomonas 8/09, chlamydia 2014    Past Surgical History  Procedure Laterality Date  . Lymph node biopsy  2010    left side neck  . Bone marrow harvest  2005  . Retinal detachment repair w/ scleral buckle le  07/17/11    right eye  . Scleral buckle  07/17/2011    Procedure: SCLERAL BUCKLE;  Surgeon: Hayden Pedro, MD;  Location: [place];  Service: Ophthalmology;  Laterality: Right;  . Eye surgery    . Implanon removal      removed 2011    No current outpatient prescriptions on file.   No current facility-administered medications for this visit.    Family History   Problem Relation Age of Onset  . Anesthesia problems Neg Hx   . Cancer Brother     leukemia    ROS:  Pertinent items are noted in HPI.  Otherwise, a comprehensive ROS was negative.  Exam:   BP 90/60  Pulse 64  Resp 16  Ht 5' 0.75" (1.543 m)  Wt 137 lb (62.143 kg)  BMI 26.10 kg/m2  LMP 08/12/2013 Height: 5' 0.75" (154.3 cm)  Ht Readings from Last 3 Encounters:  08/21/13 5' 0.75" (1.543 m)  03/15/13 5' 2.5" (1.588 m)  01/12/13 5\' 1"  (1.549 m)    General appearance: alert, cooperative and appears stated age Head: Normocephalic, without obvious abnormality, atraumatic Neck: no adenopathy, supple, symmetrical, trachea midline and thyroid normal to inspection and palpation and non-palpable Lungs: clear to auscultation bilaterally Breasts: normal appearance, no masses or tenderness, No nipple retraction or dimpling, No nipple discharge or bleeding, No axillary or supraclavicular adenopathy Heart: regular rate and rhythm Abdomen: soft, non-tender; no masses,  no organomegaly Extremities: extremities normal, atraumatic, no cyanosis or edema Skin: Skin color, texture, turgor normal. No rashes or lesions Lymph nodes: Cervical, supraclavicular, and axillary nodes normal. No abnormal inguinal nodes palpated Neurologic: Grossly normal   Pelvic: External genitalia:  no lesions  Urethra:  normal appearing urethra with no masses, tenderness or lesions              Bartholin's and Skene's: normal                 Vagina: normal appearing vagina with normal color and discharge, no lesions ph. 4.0 wet prep taken              Cervix: normal,non  tender              Pap taken: yes Bimanual Exam:  Uterus:  normal size, contour, position, consistency, mobility, non-tender and anteverted              Adnexa: normal adnexa, no mass, fullness, tenderness and positive for: left adnexal mass, non tender, not mobile, 3 cm appoximate size               Rectovaginal: Confirms                Anus: deferred  Wet prep negative  A:  Well Woman with normal exam  Contraception condoms  Negative  Wet prep  Left adnexal mass   DUB  P:   Reviewed health and wellness pertinent to exam  Stressed consistent use  Discussed negative wet prep just normal discharge  Discussed findings and need for evaluation with PUS. Patient was instructed insurance personnel will [employer] and call with information and then PUS will be scheduled. Patient agreeable. Warning signs of adnexal mass given and need to call. May be causing changes with menses cycle. Continue to record menses bleeding.  Pap smear taken today with reflex.   counseled on breast self exam, STD prevention, HIV risk factors and prevention, adequate intake of calcium and vitamin D, diet and exercise  return annually or prn  An After Visit Summary was printed and given to the patient.

## 2013-08-21 NOTE — Patient Instructions (Addendum)
General topics  Next pap or exam is  due in 1 year Take a Women's multivitamin Take 1200 mg. of calcium daily - prefer dietary If any concerns in interim to call back  Breast Self-Awareness Practicing breast self-awareness may pick up problems early, prevent significant medical complications, and possibly save your life. By practicing breast self-awareness, you can become familiar with how your breasts look and feel and if your breasts are changing. This allows you to notice changes early. It can also offer you some reassurance that your breast health is good. One way to learn what is normal for your breasts and whether your breasts are changing is to do a breast self-exam. If you find a lump or something that was not present in the past, it is best to contact your caregiver right away. Other findings that should be evaluated by your caregiver include nipple discharge, especially if it is bloody; skin changes or reddening; areas where the skin seems to be pulled in (retracted); or new lumps and bumps. Breast pain is seldom associated with cancer (malignancy), but should also be evaluated by a caregiver. BREAST SELF-EXAM The best time to examine your breasts is 5 7 days after your menstrual period is over.  ExitCare Patient Information 2013 [employer].   Exercise to Stay Healthy Exercise helps you become and stay healthy. EXERCISE IDEAS AND TIPS Choose exercises that:  You enjoy.  Fit into your day. You do not need to exercise really hard to be healthy. You can do exercises at a slow or medium level and stay healthy. You can:  Stretch before and after working out.  Try yoga, Pilates, or tai chi.  Lift weights.  Walk fast, swim, jog, run, climb stairs, bicycle, dance, or rollerskate.  Take aerobic classes. Exercises that burn about 150 calories:  Running 1  miles in 15 minutes.  Playing volleyball for 45 to 60 minutes.  Washing and waxing a car for 45 to 60  minutes.  Playing touch football for 45 minutes.  Walking 1  miles in 35 minutes.  Pushing a stroller 1  miles in 30 minutes.  Playing basketball for 30 minutes.  Raking leaves for 30 minutes.  Bicycling 5 miles in 30 minutes.  Walking 2 miles in 30 minutes.  Dancing for 30 minutes.  Shoveling snow for 15 minutes.  Swimming laps for 20 minutes.  Walking up stairs for 15 minutes.  Bicycling 4 miles in 15 minutes.  Gardening for 30 to 45 minutes.  Jumping rope for 15 minutes.  Washing windows or floors for 45 to 60 minutes. Document Released: 04/07/2010 Document Revised: 05/28/2011 Document Reviewed: 04/07/2010 Exit[employer] Patient Information 2013 [employer].   Other topics ( that may be useful information):    Sexually Transmitted Disease Sexually transmitted disease (STD) refers to any infection that is passed from person to person during sexual activity. This may happen by way of saliva, semen, blood, vaginal mucus, or urine. Common STDs include:  Gonorrhea.  Chlamydia.  Syphilis.  HIV/AIDS.  Genital herpes.  Hepatitis B and C.  Trichomonas.  Human papillomavirus (HPV).  Pubic lice. CAUSES  An STD may be spread by bacteria, virus, or parasite. A person can get an STD by:  Sexual intercourse with an infected person.  Sharing sex toys with an infected person.  Sharing needles with an infected person.  Having intimate contact with the genitals, mouth, or rectal areas of an infected person. SYMPTOMS  Some people may not have any symptoms, but   they can still pass the infection to others. Different STDs have different symptoms. Symptoms include:  Painful or bloody urination.  Pain in the pelvis, abdomen, vagina, anus, throat, or eyes.  Skin rash, itching, irritation, growths, or sores (lesions). These usually occur in the genital or anal area.  Abnormal vaginal discharge.  Penile discharge in men.  Soft, flesh-colored skin growths in the  genital or anal area.  Fever.  Pain or bleeding during sexual intercourse.  Swollen glands in the groin area.  Yellow skin and eyes (jaundice). This is seen with hepatitis. DIAGNOSIS  To make a diagnosis, your caregiver may:  Take a medical history.  Perform a physical exam.  Take a specimen (culture) to be examined.  Examine a sample of discharge under a microscope.  Perform blood test TREATMENT   Chlamydia, gonorrhea, trichomonas, and syphilis can be cured with antibiotic medicine.  Genital herpes, hepatitis, and HIV can be treated, but not cured, with prescribed medicines. The medicines will lessen the symptoms.  Genital warts from HPV can be treated with medicine or by freezing, burning (electrocautery), or surgery. Warts may come back.  HPV is a virus and cannot be cured with medicine or surgery.However, abnormal areas may be followed very closely by your caregiver and may be removed from the cervix, vagina, or vulva through office procedures or surgery. If your diagnosis is confirmed, your recent sexual partners need treatment. This is true even if they are symptom-free or have a negative culture or evaluation. They should not have sex until their caregiver says it is okay. HOME CARE INSTRUCTIONS  All sexual partners should be informed, tested, and treated for all STDs.  Take your antibiotics as directed. Finish them even if you start to feel better.  Only take over-the-counter or prescription medicines for pain, discomfort, or fever as directed by your caregiver.  Rest.  Eat a balanced diet and drink enough fluids to keep your urine clear or pale yellow.  Do not have sex until treatment is completed and you have followed up with your caregiver. STDs should be checked after treatment.  Keep all follow-up appointments, Pap tests, and blood tests as directed by your caregiver.  Only use latex condoms and water-soluble lubricants during sexual activity. Do not use  petroleum jelly or oils.  Avoid alcohol and illegal drugs.  Get vaccinated for HPV and hepatitis. If you have not received these vaccines in the past, talk to your caregiver about whether one or both might be right for you.  Avoid risky sex practices that can break the skin. The only way to avoid getting an STD is to avoid all sexual activity.Latex condoms and dental dams (for oral sex) will help lessen the risk of getting an STD, but will not completely eliminate the risk. SEEK MEDICAL CARE IF:   You have a fever.  You have any new or worsening symptoms. Document Released: 05/26/2002 Document Revised: 05/28/2011 Document Reviewed: 06/02/2010 [facility] Patient Information 2013 [place].    Domestic Abuse You are being battered or abused if someone close to you hits, pushes, or physically hurts you in any way. You also are being abused if you are forced into activities. You are being sexually abused if you are forced to have sexual contact of any kind. You are being emotionally abused if you are made to feel worthless or if you are constantly threatened. It is important to remember that help is available. No one has the right to abuse you. PREVENTION OF FURTHER  ABUSE  Learn the warning signs of danger. This varies with situations but may include: the use of alcohol, threats, isolation from friends and family, or forced sexual contact. Leave if you feel that violence is going to occur.  If you are attacked or beaten, report it to the police so the abuse is documented. You do not have to press charges. The police can protect you while you or the attackers are leaving. Get the officer's name and badge number and a copy of the report.  Find someone you can trust and tell them what is happening to you: your caregiver, a nurse, clergy member, close friend or family member. Feeling ashamed is natural, but remember that you have done nothing wrong. No one deserves abuse. Document Released:  03/02/2000 Document Revised: 05/28/2011 Document Reviewed: 05/11/2010 ExitCare Patient Information 2013 [employer].    How Much is Too Much Alcohol? Drinking too much alcohol can cause injury, accidents, and health problems. These types of problems can include:   Car crashes.  Falls.  Family fighting (domestic violence).  Drowning.  Fights.  Injuries.  Burns.  Damage to certain organs.  Having a baby with birth defects. ONE DRINK CAN BE TOO MUCH WHEN YOU ARE:  Working.  Pregnant or breastfeeding.  Taking medicines. Ask your doctor.  Driving or planning to drive. If you or someone you know has a drinking problem, get help from a doctor.  Document Released: 12/30/2008 Document Revised: 05/28/2011 Document Reviewed: 12/30/2008 ExitCare Patient Information 2013 [employer].   Smoking Hazards Smoking cigarettes is extremely bad for your health. Tobacco smoke has over 200 known poisons in it. There are over 60 chemicals in tobacco smoke that cause cancer. Some of the chemicals found in cigarette smoke include:   Cyanide.  Benzene.  Formaldehyde.  Methanol (wood alcohol).  Acetylene (fuel used in welding torches).  Ammonia. Cigarette smoke also contains the poisonous gases nitrogen oxide and carbon monoxide.  Cigarette smokers have an increased risk of many serious medical problems and Smoking causes approximately:  90% of all lung cancer deaths in men.  80% of all lung cancer deaths in women.  90% of deaths from chronic obstructive lung disease. Compared with nonsmokers, smoking increases the risk of:  Coronary heart disease by 2 to 4 times.  Stroke by 2 to 4 times.  Men developing lung cancer by 23 times.  Women developing lung cancer by 13 times.  Dying from chronic obstructive lung diseases by 12 times.  . Smoking is the most preventable cause of death and disease in our society.  WHY IS SMOKING ADDICTIVE?  Nicotine is the chemical  agent in tobacco that is capable of causing addiction or dependence.  When you smoke and inhale, nicotine is absorbed rapidly into the bloodstream through your lungs. Nicotine absorbed through the lungs is capable of creating a powerful addiction. Both inhaled and non-inhaled nicotine may be addictive.  Addiction studies of cigarettes and spit tobacco show that addiction to nicotine occurs mainly during the teen years, when young people begin using tobacco products. WHAT ARE THE BENEFITS OF QUITTING?  There are many health benefits to quitting smoking.   Likelihood of developing cancer and heart disease decreases. Health improvements are seen almost immediately.  Blood pressure, pulse rate, and breathing patterns start returning to normal soon after quitting. QUITTING SMOKING   American Lung Association - 1-800-LUNGUSA  American Cancer Society - 1-800-ACS-2345 Document Released: 04/12/2004 Document Revised: 05/28/2011 Document Reviewed: 12/15/2008 ExitCare Patient Information 2013 ExitCare[employer]   LL[employer].   Stress Management Stress is a state of physical or mental tension that often results from changes in your life or normal routine. Some common causes of stress are:  Death of a loved one.  Injuries or severe illnesses.  Getting fired or changing jobs.  Moving into a new home. Other causes may be:  Sexual problems.  Business or financial losses.  Taking on a large debt.  Regular conflict with someone at home or at work.  Constant tiredness from lack of sleep. It is not just bad things that are stressful. It may be stressful to:  Win the lottery.  Get married.  Buy a new car. The amount of stress that can be easily tolerated varies from person to person. Changes generally cause stress, regardless of the types of change. Too much stress can affect your health. It may lead to physical or emotional problems. Too little stress (boredom) may also become stressful. SUGGESTIONS TO  REDUCE STRESS:  Talk things over with your family and friends. It often is helpful to share your concerns and worries. If you feel your problem is serious, you may want to get help from a professional counselor.  Consider your problems one at a time instead of lumping them all together. Trying to take care of everything at once may seem impossible. List all the things you need to do and then start with the most important one. Set a goal to accomplish 2 or 3 things each day. If you expect to do too many in a single day you will naturally fail, causing you to feel even more stressed.  Do not use alcohol or drugs to relieve stress. Although you may feel better for a short time, they do not remove the problems that caused the stress. They can also be habit forming.  Exercise regularly - at least 3 times per week. Physical exercise can help to relieve that "uptight" feeling and will relax you.  The shortest distance between despair and hope is often a good night's sleep.  Go to bed and get up on time allowing yourself time for appointments without being rushed.  Take a short "time-out" period from any stressful situation that occurs during the day. Close your eyes and take some deep breaths. Starting with the muscles in your face, tense them, hold it for a few seconds, then relax. Repeat this with the muscles in your neck, shoulders, hand, stomach, back and legs.  Take good care of yourself. Eat a balanced diet and get plenty of rest.  Schedule time for having fun. Take a break from your daily routine to relax. HOME CARE INSTRUCTIONS   Call if you feel overwhelmed by your problems and feel you can no longer manage them on your own.  Return immediately if you feel like hurting yourself or someone else. Document Released: 08/29/2000 Document Revised: 05/28/2011 Document Reviewed: 04/21/2007 [facility] Patient Information 2013 [place].       General topics  Next pap or exam is  due in 1  year Take a Women's multivitamin Take 1200 mg. of calcium daily - prefer dietary If any concerns in interim to call back  Breast Self-Awareness Practicing breast self-awareness may pick up problems early, prevent significant medical complications, and possibly save your life. By practicing breast self-awareness, you can become familiar with how your breasts look and feel and if your breasts are changing. This allows you to notice changes early. It can also offer you some reassurance that your breast  health is good. One way to learn what is normal for your breasts and whether your breasts are changing is to do a breast self-exam. If you find a lump or something that was not present in the past, it is best to contact your caregiver right away. Other findings that should be evaluated by your caregiver include nipple discharge, especially if it is bloody; skin changes or reddening; areas where the skin seems to be pulled in (retracted); or new lumps and bumps. Breast pain is seldom associated with cancer (malignancy), but should also be evaluated by a caregiver. BREAST SELF-EXAM The best time to examine your breasts is 5 7 days after your menstrual period is over.  ExitCare Patient Information 2013 [place].   Exercise to Stay Healthy Exercise helps you become and stay healthy. EXERCISE IDEAS AND TIPS Choose exercises that:  You enjoy.  Fit into your day. You do not need to exercise really hard to be healthy. You can do exercises at a slow or medium level and stay healthy. You can:  Stretch before and after working out.  Try yoga, Pilates, or tai chi.  Lift weights.  Walk fast, swim, jog, run, climb stairs, bicycle, dance, or rollerskate.  Take aerobic classes. Exercises that burn about 150 calories:  Running 1  miles in 15 minutes.  Playing volleyball for 45 to 60 minutes.  Washing and waxing a car for 45 to 60 minutes.  Playing touch football for 45 minutes.  Walking 1   miles in 35 minutes.  Pushing a stroller 1  miles in 30 minutes.  Playing basketball for 30 minutes.  Raking leaves for 30 minutes.  Bicycling 5 miles in 30 minutes.  Walking 2 miles in 30 minutes.  Dancing for 30 minutes.  Shoveling snow for 15 minutes.  Swimming laps for 20 minutes.  Walking up stairs for 15 minutes.  Bicycling 4 miles in 15 minutes.  Gardening for 30 to 45 minutes.  Jumping rope for 15 minutes.  Washing windows or floors for 45 to 60 minutes. Document Released: 04/07/2010 Document Revised: 05/28/2011 Document Reviewed: 04/07/2010 [facility] Patient Information 2013 [place].   Other topics ( that may be useful information):    Sexually Transmitted Disease Sexually transmitted disease (STD) refers to any infection that is passed from person to person during sexual activity. This may happen by way of saliva, semen, blood, vaginal mucus, or urine. Common STDs include:  Gonorrhea.  Chlamydia.  Syphilis.  HIV/AIDS.  Genital herpes.  Hepatitis B and C.  Trichomonas.  Human papillomavirus (HPV).  Pubic lice. CAUSES  An STD may be spread by bacteria, virus, or parasite. A person can get an STD by:  Sexual intercourse with an infected person.  Sharing sex toys with an infected person.  Sharing needles with an infected person.  Having intimate contact with the genitals, mouth, or rectal areas of an infected person. SYMPTOMS  Some people may not have any symptoms, but they can still pass the infection to others. Different STDs have different symptoms. Symptoms include:  Painful or bloody urination.  Pain in the pelvis, abdomen, vagina, anus, throat, or eyes.  Skin rash, itching, irritation, growths, or sores (lesions). These usually occur in the genital or anal area.  Abnormal vaginal discharge.  Penile discharge in men.  Soft, flesh-colored skin growths in the genital or anal area.  Fever.  Pain or bleeding during  sexual intercourse.  Swollen glands in the groin area.  Yellow skin and eyes (jaundice).  This is seen with hepatitis. DIAGNOSIS  To make a diagnosis, your caregiver may:  Take a medical history.  Perform a physical exam.  Take a specimen (culture) to be examined.  Examine a sample of discharge under a microscope.  Perform blood test TREATMENT   Chlamydia, gonorrhea, trichomonas, and syphilis can be cured with antibiotic medicine.  Genital herpes, hepatitis, and HIV can be treated, but not cured, with prescribed medicines. The medicines will lessen the symptoms.  Genital warts from HPV can be treated with medicine or by freezing, burning (electrocautery), or surgery. Warts may come back.  HPV is a virus and cannot be cured with medicine or surgery.However, abnormal areas may be followed very closely by your caregiver and may be removed from the cervix, vagina, or vulva through office procedures or surgery. If your diagnosis is confirmed, your recent sexual partners need treatment. This is true even if they are symptom-free or have a negative culture or evaluation. They should not have sex until their caregiver says it is okay. HOME CARE INSTRUCTIONS  All sexual partners should be informed, tested, and treated for all STDs.  Take your antibiotics as directed. Finish them even if you start to feel better.  Only take over-the-counter or prescription medicines for pain, discomfort, or fever as directed by your caregiver.  Rest.  Eat a balanced diet and drink enough fluids to keep your urine clear or pale yellow.  Do not have sex until treatment is completed and you have followed up with your caregiver. STDs should be checked after treatment.  Keep all follow-up appointments, Pap tests, and blood tests as directed by your caregiver.  Only use latex condoms and water-soluble lubricants during sexual activity. Do not use petroleum jelly or oils.  Avoid alcohol and illegal  drugs.  Get vaccinated for HPV and hepatitis. If you have not received these vaccines in the past, talk to your caregiver about whether one or both might be right for you.  Avoid risky sex practices that can break the skin. The only way to avoid getting an STD is to avoid all sexual activity.Latex condoms and dental dams (for oral sex) will help lessen the risk of getting an STD, but will not completely eliminate the risk. SEEK MEDICAL CARE IF:   You have a fever.  You have any new or worsening symptoms. Document Released: 05/26/2002 Document Revised: 05/28/2011 Document Reviewed: 06/02/2010 [facility] Patient Information 2013 [place].    Domestic Abuse You are being battered or abused if someone close to you hits, pushes, or physically hurts you in any way. You also are being abused if you are forced into activities. You are being sexually abused if you are forced to have sexual contact of any kind. You are being emotionally abused if you are made to feel worthless or if you are constantly threatened. It is important to remember that help is available. No one has the right to abuse you. PREVENTION OF FURTHER ABUSE  Learn the warning signs of danger. This varies with situations but may include: the use of alcohol, threats, isolation from friends and family, or forced sexual contact. Leave if you feel that violence is going to occur.  If you are attacked or beaten, report it to the police so the abuse is documented. You do not have to press charges. The police can protect you while you or the attackers are leaving. Get the officer's name and badge number and a copy of the report.  Find someone you  can trust and tell them what is happening to you: your caregiver, a nurse, clergy member, close friend or family member. Feeling ashamed is natural, but remember that you have done nothing wrong. No one deserves abuse. Document Released: 03/02/2000 Document Revised: 05/28/2011 Document  Reviewed: 05/11/2010 [facility] Patient Information 2013 [place].    How Much is Too Much Alcohol? Drinking too much alcohol can cause injury, accidents, and health problems. These types of problems can include:   Car crashes.  Falls.  Family fighting (domestic violence).  Drowning.  Fights.  Injuries.  Burns.  Damage to certain organs.  Having a baby with birth defects. ONE DRINK CAN BE TOO MUCH WHEN YOU ARE:  Working.  Pregnant or breastfeeding.  Taking medicines. Ask your doctor.  Driving or planning to drive. If you or someone you know has a drinking problem, get help from a doctor.  Document Released: 12/30/2008 Document Revised: 05/28/2011 Document Reviewed: 12/30/2008 [facility] Patient Information 2013 [place].   Smoking Hazards Smoking cigarettes is extremely bad for your health. Tobacco smoke has over 200 known poisons in it. There are over 60 chemicals in tobacco smoke that cause cancer. Some of the chemicals found in cigarette smoke include:   Cyanide.  Benzene.  Formaldehyde.  Methanol (wood alcohol).  Acetylene (fuel used in welding torches).  Ammonia. Cigarette smoke also contains the poisonous gases nitrogen oxide and carbon monoxide.  Cigarette smokers have an increased risk of many serious medical problems and Smoking causes approximately:  90% of all lung cancer deaths in men.  80% of all lung cancer deaths in women.  90% of deaths from chronic obstructive lung disease. Compared with nonsmokers, smoking increases the risk of:  Coronary heart disease by 2 to 4 times.  Stroke by 2 to 4 times.  Men developing lung cancer by 23 times.  Women developing lung cancer by 13 times.  Dying from chronic obstructive lung diseases by 12 times.  . Smoking is the most preventable cause of death and disease in our society.  WHY IS SMOKING ADDICTIVE?  Nicotine is the chemical agent in tobacco that is capable of causing addiction  or dependence.  When you smoke and inhale, nicotine is absorbed rapidly into the bloodstream through your lungs. Nicotine absorbed through the lungs is capable of creating a powerful addiction. Both inhaled and non-inhaled nicotine may be addictive.  Addiction studies of cigarettes and spit tobacco show that addiction to nicotine occurs mainly during the teen years, when young people begin using tobacco products. WHAT ARE THE BENEFITS OF QUITTING?  There are many health benefits to quitting smoking.   Likelihood of developing cancer and heart disease decreases. Health improvements are seen almost immediately.  Blood pressure, pulse rate, and breathing patterns start returning to normal soon after quitting. QUITTING SMOKING   American Lung Association - 1-800-LUNGUSA  American Cancer Society - 1-800-ACS-2345 Document Released: 04/12/2004 Document Revised: 05/28/2011 Document Reviewed: 12/15/2008 [facility] Patient Information 2013 [place].   Stress Management Stress is a state of physical or mental tension that often results from changes in your life or normal routine. Some common causes of stress are:  Death of a loved one.  Injuries or severe illnesses.  Getting fired or changing jobs.  Moving into a new home. Other causes may be:  Sexual problems.  Business or financial losses.  Taking on a large debt.  Regular conflict with someone at home or at work.  Constant tiredness from lack of sleep. It is not  just bad things that are stressful. It may be stressful to:  Win the lottery.  Get married.  Buy a new car. The amount of stress that can be easily tolerated varies from person to person. Changes generally cause stress, regardless of the types of change. Too much stress can affect your health. It may lead to physical or emotional problems. Too little stress (boredom) may also become stressful. SUGGESTIONS TO REDUCE STRESS:  Talk things over with your family and  friends. It often is helpful to share your concerns and worries. If you feel your problem is serious, you may want to get help from a professional counselor.  Consider your problems one at a time instead of lumping them all together. Trying to take care of everything at once may seem impossible. List all the things you need to do and then start with the most important one. Set a goal to accomplish 2 or 3 things each day. If you expect to do too many in a single day you will naturally fail, causing you to feel even more stressed.  Do not use alcohol or drugs to relieve stress. Although you may feel better for a short time, they do not remove the problems that caused the stress. They can also be habit forming.  Exercise regularly - at least 3 times per week. Physical exercise can help to relieve that "uptight" feeling and will relax you.  The shortest distance between despair and hope is often a good night's sleep.  Go to bed and get up on time allowing yourself time for appointments without being rushed.  Take a short "time-out" period from any stressful situation that occurs during the day. Close your eyes and take some deep breaths. Starting with the muscles in your face, tense them, hold it for a few seconds, then relax. Repeat this with the muscles in your neck, shoulders, hand, stomach, back and legs.  Take good care of yourself. Eat a balanced diet and get plenty of rest.  Schedule time for having fun. Take a break from your daily routine to relax. HOME CARE INSTRUCTIONS   Call if you feel overwhelmed by your problems and feel you can no longer manage them on your own.  Return immediately if you feel like hurting yourself or someone else. Document Released: 08/29/2000 Document Revised: 05/28/2011 Document Reviewed: 04/21/2007 [facility] Patient Information 2013 [place].

## 2013-08-22 LAB — STD PANEL
HIV 1&2 Ab, 4th Generation: NONREACTIVE
Hepatitis B Surface Ag: NEGATIVE

## 2013-08-24 LAB — HEMOGLOBIN, FINGERSTICK: HEMOGLOBIN, FINGERSTICK: 11.4 g/dL — AB (ref 12.0–16.0)

## 2013-08-25 ENCOUNTER — Telehealth: Payer: Self-pay | Admitting: Certified Nurse Midwife

## 2013-08-25 DIAGNOSIS — N9489 Other specified conditions associated with female genital organs and menstrual cycle: Secondary | ICD-10-CM

## 2013-08-25 LAB — IPS PAP TEST WITH REFLEX TO HPV

## 2013-08-25 LAB — IPS N GONORRHOEA AND CHLAMYDIA BY PCR

## 2013-08-25 NOTE — Telephone Encounter (Signed)
Patient is calling saying that she was supposed to get a call about a setting up an ultrasound

## 2013-08-25 NOTE — Telephone Encounter (Signed)
Spoke with patient. Patient states that she would like to schedule her ultrasound at this time. Advised patient that OOP cost for ultrasound would be $30. Patient agreeable. Offered appointment for Thursday at 11:00am with Dr.Silva but patient declines. Patient would like to wait until next Tuesday if possible as she just started a new job. Offered appointment for next Tuesday with Dr.Lathrop at 12:30 with 1:00 consult. Patient states that earlier in the day would be better and would like to schedule for Thursday. Appointment scheduled for next Thursday at 9:30am with 10:00am consult with Dr.Silva. Patient agreeable to date and time. Pelvic U/S scheduled and patient aware/agreeable to time.  Patient verbalized understanding of the U/S appointment cancellation policy. Advised will need to cancel within 72 business hours (3 business days) or will have $100.00 no show fee placed to account. Patient agreeable. Patient would like to let Regina Eck CNM that over the past 48 hours patient has begun to feel "pressure" on her left side that she was not feeling before. Patient denies sharp or intense pain. Advised patient with new symptom patient should be seen sooner for ultrasound. Patient would like to keep appointment for next Thursday due to work schedule. Advised patient that if "pressure" increases or pain begins that she will need to contact our office for earlier appointment. Advised patient if it is after hours or during work she will need to seek urgent care or go to the ER. Patient agreeable and verbalizes understanding.  Routing to Regina Eck CNM CC: Dr.Silva  Routing to provider for final review. Patient agreeable to disposition. Will close encounter

## 2013-08-25 NOTE — Telephone Encounter (Signed)
I agree with your appropriate advice.

## 2013-08-26 ENCOUNTER — Telehealth: Payer: Self-pay | Admitting: Obstetrics and Gynecology

## 2013-08-26 NOTE — Telephone Encounter (Signed)
Pt wants to reschedule her ultrasound. She would like to come in on June 16 th @12 :30pm if possible please call to reschedule appointment.

## 2013-08-26 NOTE — Telephone Encounter (Signed)
Patient called back. Advised that PUS appointment has been rescheduled to 06/16 @ 1230. Patient agreeable.

## 2013-08-26 NOTE — Telephone Encounter (Signed)
Called patient to advise that appointment has been rescheduled for 06/16 @ 1230. There was no answer and no voicemail available.

## 2013-08-27 NOTE — Telephone Encounter (Signed)
Patient said she was returning a call someone called about  20 mins ago and did not leave a message.

## 2013-08-29 NOTE — Progress Notes (Signed)
Agree with plan for ultrasound evaluation and with remainder of exam/plan.  Felipa Emory, MD

## 2013-08-31 NOTE — Telephone Encounter (Signed)
Spoke with patient. Moved PUS down on schedule to 1pm. Patient agreeable.

## 2013-08-31 NOTE — Telephone Encounter (Signed)
Call to patient. No voicemail available. Need to reschedule appt 06/16. Need to schedule PUS at 1 instead of 12:30.

## 2013-09-01 ENCOUNTER — Ambulatory Visit (INDEPENDENT_AMBULATORY_CARE_PROVIDER_SITE_OTHER): Payer: BC Managed Care – PPO | Admitting: Gynecology

## 2013-09-01 ENCOUNTER — Other Ambulatory Visit: Payer: BC Managed Care – PPO | Admitting: Gynecology

## 2013-09-01 ENCOUNTER — Other Ambulatory Visit: Payer: Self-pay | Admitting: Gynecology

## 2013-09-01 ENCOUNTER — Other Ambulatory Visit: Payer: BC Managed Care – PPO

## 2013-09-01 ENCOUNTER — Other Ambulatory Visit: Payer: Self-pay | Admitting: *Deleted

## 2013-09-01 ENCOUNTER — Ambulatory Visit (INDEPENDENT_AMBULATORY_CARE_PROVIDER_SITE_OTHER): Payer: BC Managed Care – PPO

## 2013-09-01 VITALS — BP 98/60 | Resp 12 | Ht 60.75 in | Wt 139.0 lb

## 2013-09-01 DIAGNOSIS — R1900 Intra-abdominal and pelvic swelling, mass and lump, unspecified site: Secondary | ICD-10-CM

## 2013-09-01 DIAGNOSIS — R1904 Left lower quadrant abdominal swelling, mass and lump: Secondary | ICD-10-CM

## 2013-09-01 DIAGNOSIS — N949 Unspecified condition associated with female genital organs and menstrual cycle: Secondary | ICD-10-CM

## 2013-09-01 DIAGNOSIS — N9489 Other specified conditions associated with female genital organs and menstrual cycle: Secondary | ICD-10-CM

## 2013-09-01 NOTE — Progress Notes (Signed)
       Pt here for PUS for left adnexal mass noted on PE.  Pt is without pain. Images were reviewed with pt: Uterus in normal, right ovary with CL cyst, left ovary normal.  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 No free fluid, normal kidneys noted On BME mass is mobile, non-tender Pt reports elective termination of pregnancy in 2014 She does recall mentioning a pelvic mass and recommending MRI. Pt did not have MRI done,  Images were reviewed and mass noted, size and appearance  is unchanged  Pt reports that cycles were normal until 3/15 and now she had bleeding between cycles, light spotting for 3d.  Pt is not on contraception and is sexually active.  GC/CTM negative. Will schedule MRI of pelvis Defer labs until source of mass better delineated. Questions addressed. 2m spent counseling and reviewing prior images, >50% face to face

## 2013-09-03 ENCOUNTER — Telehealth: Payer: Self-pay | Admitting: Certified Nurse Midwife

## 2013-09-03 ENCOUNTER — Other Ambulatory Visit: Payer: BC Managed Care – PPO | Admitting: Obstetrics and Gynecology

## 2013-09-03 ENCOUNTER — Other Ambulatory Visit: Payer: BC Managed Care – PPO

## 2013-09-03 DIAGNOSIS — N9489 Other specified conditions associated with female genital organs and menstrual cycle: Secondary | ICD-10-CM

## 2013-09-03 NOTE — Telephone Encounter (Signed)
Patient calling to check on status of pre-cert for MRI.  Cc: Nichole Davis

## 2013-09-04 ENCOUNTER — Encounter (HOSPITAL_COMMUNITY): Payer: Self-pay | Admitting: Emergency Medicine

## 2013-09-04 ENCOUNTER — Emergency Department (HOSPITAL_COMMUNITY)
Admission: EM | Admit: 2013-09-04 | Discharge: 2013-09-05 | Disposition: A | Discharge status: Home / Self Care | Payer: BC Managed Care – PPO | Attending: Emergency Medicine | Admitting: Emergency Medicine

## 2013-09-04 DIAGNOSIS — R102 Pelvic and perineal pain: Secondary | ICD-10-CM

## 2013-09-04 DIAGNOSIS — Z8619 Personal history of other infectious and parasitic diseases: Secondary | ICD-10-CM | POA: Insufficient documentation

## 2013-09-04 DIAGNOSIS — Z523 Bone marrow donor: Secondary | ICD-10-CM | POA: Insufficient documentation

## 2013-09-04 DIAGNOSIS — N949 Unspecified condition associated with female genital organs and menstrual cycle: Secondary | ICD-10-CM | POA: Insufficient documentation

## 2013-09-04 DIAGNOSIS — K219 Gastro-esophageal reflux disease without esophagitis: Secondary | ICD-10-CM | POA: Insufficient documentation

## 2013-09-04 DIAGNOSIS — F411 Generalized anxiety disorder: Secondary | ICD-10-CM | POA: Insufficient documentation

## 2013-09-04 NOTE — Telephone Encounter (Signed)
Patient calling to check on the status of this request.

## 2013-09-04 NOTE — Telephone Encounter (Signed)
Routing to [place] for review and advise.

## 2013-09-04 NOTE — ED Notes (Signed)
Pt c/o L lower abd pain for several weeks worse tonight. Pt states she had recent u/s with unkn mass between ovary and uterus, will have f/u this week. Pain quality changed today from cramping to stinging sharp pain.

## 2013-09-05 LAB — GC/CHLAMYDIA PROBE AMP
CT Probe RNA: NEGATIVE
GC PROBE AMP APTIMA: NEGATIVE

## 2013-09-05 LAB — CBC WITH DIFFERENTIAL/PLATELET
BASOS PCT: 0 % (ref 0–1)
Basophils Absolute: 0 10*3/uL (ref 0.0–0.1)
Eosinophils Absolute: 0.2 10*3/uL (ref 0.0–0.7)
Eosinophils Relative: 2 % (ref 0–5)
HCT: 32.4 % — ABNORMAL LOW (ref 36.0–46.0)
Hemoglobin: 10.1 g/dL — ABNORMAL LOW (ref 12.0–15.0)
LYMPHS ABS: 2.2 10*3/uL (ref 0.7–4.0)
LYMPHS PCT: 26 % (ref 12–46)
MCH: 24.8 pg — ABNORMAL LOW (ref 26.0–34.0)
MCHC: 31.2 g/dL (ref 30.0–36.0)
MCV: 79.4 fL (ref 78.0–100.0)
Monocytes Absolute: 0.7 10*3/uL (ref 0.1–1.0)
Monocytes Relative: 8 % (ref 3–12)
NEUTROS PCT: 64 % (ref 43–77)
Neutro Abs: 5.7 10*3/uL (ref 1.7–7.7)
PLATELETS: 279 10*3/uL (ref 150–400)
RBC: 4.08 MIL/uL (ref 3.87–5.11)
RDW: 15.7 % — AB (ref 11.5–15.5)
WBC: 8.8 10*3/uL (ref 4.0–10.5)

## 2013-09-05 LAB — BASIC METABOLIC PANEL
BUN: 10 mg/dL (ref 6–23)
CALCIUM: 9.3 mg/dL (ref 8.4–10.5)
CO2: 28 mEq/L (ref 19–32)
CREATININE: 0.63 mg/dL (ref 0.50–1.10)
Chloride: 103 mEq/L (ref 96–112)
GFR calc Af Amer: >90 mL/min (ref >90)
GLUCOSE: 119 mg/dL — AB (ref 70–99)
Potassium: 3.5 mEq/L — ABNORMAL LOW (ref 3.7–5.3)
SODIUM: 141 meq/L (ref 137–147)

## 2013-09-05 LAB — URINALYSIS, ROUTINE W REFLEX MICROSCOPIC
BILIRUBIN URINE: NEGATIVE
Glucose, UA: NEGATIVE mg/dL
Hgb urine dipstick: NEGATIVE
Ketones, ur: NEGATIVE mg/dL
LEUKOCYTES UA: NEGATIVE
Nitrite: NEGATIVE
PH: 7 (ref 5.0–8.0)
Protein, ur: NEGATIVE mg/dL
Specific Gravity, Urine: 1.024 (ref 1.005–1.030)
Urobilinogen, UA: 1 mg/dL (ref 0.0–1.0)

## 2013-09-05 LAB — WET PREP, GENITAL
Trich, Wet Prep: NONE SEEN
Yeast Wet Prep HPF POC: NONE SEEN

## 2013-09-05 LAB — POC URINE PREG, ED: Preg Test, Ur: NEGATIVE

## 2013-09-05 MED ORDER — HYDROCODONE-ACETAMINOPHEN 5-325 MG PO TABS: 1.0000 | ORAL_TABLET | Freq: Four times a day (QID) | ORAL | Status: DC | PRN

## 2013-09-05 NOTE — ED Provider Notes (Signed)
CSN: [id number]     Arrival date & time 09/04/13  2221 History   First MD Initiated Contact with Patient 09/04/13 2237     Chief Complaint  Patient presents with  . Abdominal Pain     (Consider location/radiation/quality/duration/timing/severity/associated sxs/prior Treatment) HPI Comments: Patient presents with a chief complaint of left lower abdominal pain.  She reports that she has had the pain for the past three months.  She describes the pain as a cramping pain.  She has been seen by OB/GYN for the pain and had a pelvic ultrasound performed.  Pelvic ultrasound showed a mass between the ovary and the uterus.  She is scheduled to have an outpatient MRI of her pelvis for further evaluation.  She denies fever, chills, nausea, vomiting, urinary symptoms, or vaginal discharge.  She reports that she has had abnormal menstrual bleeding over the past 3 months.  She reports that she has not taken anything for pain prior to arrival.  She reports that the pain that she has today is similar to the pain that she has had in the past.    The history is provided by the patient.    Past Medical History  Diagnosis Date  . Anxiety   . GERD (gastroesophageal reflux disease)   . Chest pain     pt. seen at [facility] 07/12/2011, for chest pain, cleared medically, given  Lorazepam & prilosec.  Took Lorazepam & had good relief fr. it, has not taken Prilosec  . PONV (postoperative nausea and vomiting)   . Anemia   . 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 STD (sexually transmitted disease)     trichomonas 8/09, chlamydia 2014   Past Surgical History  Procedure Laterality Date  . Lymph node biopsy  2010    left side neck  . Bone marrow harvest  2005  . Retinal detachment repair w/ scleral buckle le  07/17/11    right eye  . Scleral buckle  07/17/2011    Procedure: SCLERAL BUCKLE;  Surgeon: Hayden Pedro, MD;  Location: [facility];  Service: Ophthalmology;  Laterality: Right;  . Eye surgery    .  Implanon removal      removed 2011   Family History  Problem Relation Age of Onset  . Anesthesia problems Neg Hx   . Cancer Brother     leukemia   History  Substance Use Topics  . Smoking status: Never Smoker   . Smokeless tobacco: Never Used  . Alcohol Use: No   OB History   Grav Para Term Preterm Abortions TAB SAB Ect Mult Living   2 0 0  2 1 1    0     Review of Systems  All other systems reviewed and are negative.     Allergies  Review of patient's allergies indicates no known allergies.  Home Medications   Prior to Admission medications   Not on File   BP 126/82  Pulse 86  Temp(Src) 98 F (36.7 C) (Oral)  Resp 16  Ht 5\' 1"  (1.549 m)  Wt 138 lb (62.596 kg)  BMI 26.09 kg/m2  SpO2 97%  LMP 08/12/2013 Physical Exam  Nursing note and vitals reviewed. Constitutional: She appears well-developed and well-nourished. No distress.  HENT:  Head: Normocephalic and atraumatic.  Mouth/Throat: Oropharynx is clear and moist.  Cardiovascular: Normal rate, regular rhythm and normal heart sounds.   Pulmonary/Chest: Effort normal and breath sounds normal.  Abdominal: Soft. Bowel sounds are  normal. She exhibits no distension and no mass. There is no tenderness. There is no rebound and no guarding.  Genitourinary: There is no rash or tenderness on the right labia. There is no rash or tenderness on the left labia. Cervix exhibits no motion tenderness and no discharge. Right adnexum displays no mass, no tenderness and no fullness. Left adnexum displays no mass, no tenderness and no fullness.  Neurological: She is alert.  Skin: Skin is warm and dry. She is not diaphoretic.  Psychiatric: She has a normal mood and affect.    ED Course  Procedures (including critical care time) Labs Review Labs Reviewed  CBC WITH DIFFERENTIAL - Abnormal; Notable for the following:    Hemoglobin 10.1 (*)    HCT 32.4 (*)    MCH 24.8 (*)    RDW 15.7 (*)    All other components within normal  limits  BASIC METABOLIC PANEL - Abnormal; Notable for the following:    Potassium 3.5 (*)    Glucose, Bld 119 (*)    All other components within normal limits  GC/CHLAMYDIA PROBE AMP  WET PREP, GENITAL  URINALYSIS, ROUTINE W REFLEX MICROSCOPIC  POC URINE PREG, ED    Imaging Review No results found.   EKG Interpretation None     1:35 AM Repeat abdominal exam.  No tenderness to deep palpation of the abdomen.   MDM   Final diagnoses:  None   Patient presenting with LLQ abdominal pain.  Pain has been present for the past 3 months.  She has been evaluated by OB/GYN for this pain.  She had a pelvic ultrasound done, which showed a mass in between the ovary and the Uterus.  On exam today patient without abdominal tenderness to palpation even without pain medication.  No adnexal tenderness on pelvic exam.  Patient is afebrile.  Labs today unremarkable.  Feel that the patient is stable for discharge.  Patient instructed to follow up with her OB/GYN.  Return precautions given.     Hyman Bible, PA-C 09/07/13 2353

## 2013-09-07 NOTE — Telephone Encounter (Signed)
Authorization received: Auth Number [id number] from today to 10/06/13.  Precerted MRI Pelvis w and wo contrast.

## 2013-09-07 NOTE — Telephone Encounter (Addendum)
Attempted call to patient to discuss MRI authorization and pre-procedure questions. Unable to leave message.  Needs Preprocedure questions answered and information for scheduling MRI as authorized until 10/06/13. MRI Pelvis W WO contrast:   Claustrophobia: No High Blood pressure or Diabetes:No Kidney or liver disease:no Allergy to IV contrast:no Insurance Type:BCBS Location: [facility] [place], [place] [address].   Patient scheduled for 09/12/13 at [facility] [place], [place] [address] at 3:30. She is aware. She is advised to call our office if has any concerns about pregnancy. Will need pregnancy test if periods are irregular. Advised to call us back to schedule pregnancy test if has concerns.   Advised she would be contacted by [facility] to discuss copay, payments for procedure. She is agreeable.

## 2013-09-08 NOTE — ED Provider Notes (Signed)
Medical screening examination/treatment/procedure(s) were performed by non-physician practitioner and as supervising physician I was immediately available for consultation/collaboration.   EKG Interpretation None        Osvaldo Shipper, MD 09/08/13 [phone]

## 2013-09-10 ENCOUNTER — Other Ambulatory Visit: Payer: Self-pay | Admitting: Gynecology

## 2013-09-10 DIAGNOSIS — Z139 Encounter for screening, unspecified: Secondary | ICD-10-CM

## 2013-09-11 ENCOUNTER — Ambulatory Visit
Admission: RE | Admit: 2013-09-11 | Discharge: 2013-09-11 | Disposition: A | Discharge status: Home / Self Care | Payer: BC Managed Care – PPO | Source: Ambulatory Visit | Attending: Gynecology | Admitting: Gynecology

## 2013-09-11 DIAGNOSIS — Z139 Encounter for screening, unspecified: Secondary | ICD-10-CM

## 2013-09-11 NOTE — Telephone Encounter (Signed)
Routing to Dr. Charlies Constable for final review. Patient is scheduled for MRI for 09/12/13

## 2013-09-12 ENCOUNTER — Ambulatory Visit
Admission: RE | Admit: 2013-09-12 | Discharge: 2013-09-12 | Disposition: A | Discharge status: Home / Self Care | Payer: BC Managed Care – PPO | Source: Ambulatory Visit | Attending: Gynecology | Admitting: Gynecology

## 2013-09-12 DIAGNOSIS — N9489 Other specified conditions associated with female genital organs and menstrual cycle: Secondary | ICD-10-CM

## 2013-09-12 MED ORDER — GADOBENATE DIMEGLUMINE 529 MG/ML IV SOLN
13.0000 mL | Freq: Once | INTRAVENOUS | Status: AC | PRN
  Administered 2013-09-12: 13 mL via INTRAVENOUS

## 2013-09-14 ENCOUNTER — Telehealth: Payer: Self-pay | Admitting: Gynecology

## 2013-09-14 ENCOUNTER — Other Ambulatory Visit: Payer: Self-pay | Admitting: Gynecology

## 2013-09-14 DIAGNOSIS — N9489 Other specified conditions associated with female genital organs and menstrual cycle: Secondary | ICD-10-CM

## 2013-09-14 NOTE — Telephone Encounter (Signed)
Called and spoke with pt regarding her recent MRI.  Mass origin still not identified, but no adenopathy or free fluid, stable in size.  Referred to general surgery for evaluation and treatment, they should contact her.  Pt is without complaints, questions addressed

## 2013-09-14 NOTE — Progress Notes (Signed)
Spoke to Dr Johney Maine, will see pt in office, referral made.

## 2013-10-02 ENCOUNTER — Telehealth: Payer: Self-pay | Admitting: Gynecology

## 2013-10-02 NOTE — Telephone Encounter (Signed)
Nichole Davis can you assist patient with referral?

## 2013-10-02 NOTE — Telephone Encounter (Signed)
Patient calling stating she is being referred to a specialist for a pelvic mass. She says she has been waiting for 3 weeks to hear from the office we referred her to but they have not tried to contact her. Patient requesting information on referred to office so she can call and set up the appointment herself.

## 2013-10-02 NOTE — Telephone Encounter (Signed)
Telephone patient. No voicemail available.  Need to advise that there had been a Dr to Dr review and that Dr Johney Maine has agreed to see her. [place]

## 2013-10-02 NOTE — Telephone Encounter (Signed)
Patient returned call. She was advised of peer to peer review and was provided with the telephone # for [facility] so that she can be scheduled.

## 2013-10-14 ENCOUNTER — Telehealth: Payer: Self-pay | Admitting: Obstetrics and Gynecology

## 2013-10-14 ENCOUNTER — Telehealth (INDEPENDENT_AMBULATORY_CARE_PROVIDER_SITE_OTHER): Payer: Self-pay

## 2013-10-14 DIAGNOSIS — R1900 Intra-abdominal and pelvic swelling, mass and lump, unspecified site: Secondary | ICD-10-CM

## 2013-10-14 NOTE — Telephone Encounter (Signed)
Message from Dr. Johney Maine' office:    Illene Regulus, MA at 10/14/2013 11:40 AM     Status: Signed        LM for Gabriel Cirri the referral person to call me back. Dr Johney Maine wants to make sure that the CT enterography is getting scheduled by them before the pt has an appt with Dr Johney Maine. The CT enterography was recommended in the impression on the MRI pelvis.      Dr. Charlies Constable, do you want to order CT Enterography? Patient has appointment with Dr. Johney Maine on 8/19

## 2013-10-14 NOTE — Telephone Encounter (Signed)
Not sure I understand this message... Please advise.

## 2013-10-14 NOTE — Telephone Encounter (Signed)
Questioning if we are doing anymore work on patient before they see her. MRI and CT are we going to do them.

## 2013-10-14 NOTE — Telephone Encounter (Signed)
Ok to schedule.

## 2013-10-14 NOTE — Telephone Encounter (Signed)
LM for Nichole Davis the referral person to call me back. Dr Johney Maine wants to make sure that the CT enterography is getting scheduled by them before the pt has an appt with Dr Johney Maine. The CT enterography was recommended in the impression on the MRI pelvis.

## 2013-10-15 NOTE — Telephone Encounter (Signed)
Authorization number received [id number] until 11/13/13

## 2013-10-15 NOTE — Telephone Encounter (Signed)
Lars Mage is returning a call to [place] regarding referral.

## 2013-10-16 NOTE — Telephone Encounter (Signed)
Called Nichole Davis and advised that patient is scheduled for CT Abd Pelvis Enterogram at [facility] for 10/20/13.

## 2013-10-16 NOTE — Telephone Encounter (Signed)
Nichole Davis w/Dr Lathrop's office calling to advise me that she scheduled the pt for the CT enterography for 8/4 @ [facility]. They gave the pt the appt with Dr Johney Maine for 11/04/13.

## 2013-10-19 ENCOUNTER — Ambulatory Visit: Payer: BC Managed Care – PPO

## 2013-10-19 NOTE — Telephone Encounter (Signed)
Calling patient again. No answer, unable to leave message.

## 2013-10-19 NOTE — Telephone Encounter (Signed)
Called [facility] and left message for CT Tech with [facility] who answered line. Advised patient did not come in office today for urine pregnancy testing and to advise that patient was unsure about exact dates of last cycle and not on birth control.  Baker Janus advised she would send message to CT tech.  Routing to Dr. Charlies Constable for [place].

## 2013-10-19 NOTE — Telephone Encounter (Signed)
Patient did not keep appointment this morning for urine pregnancy test prior to CT abdomen pelvis. She was to get urine pregnancy test if did not start cycle prior to CT.   Called Mobile. No answer. Unable to leave message.  Will try again.

## 2013-10-20 ENCOUNTER — Ambulatory Visit
Admission: RE | Admit: 2013-10-20 | Discharge: 2013-10-20 | Disposition: A | Discharge status: Home / Self Care | Payer: BC Managed Care – PPO | Source: Ambulatory Visit | Attending: Gynecology | Admitting: Gynecology

## 2013-10-20 DIAGNOSIS — R1900 Intra-abdominal and pelvic swelling, mass and lump, unspecified site: Secondary | ICD-10-CM

## 2013-10-20 MED ORDER — IOHEXOL 300 MG/ML  SOLN
100.0000 mL | Freq: Once | INTRAMUSCULAR | Status: AC | PRN
  Administered 2013-10-20: 100 mL via INTRAVENOUS

## 2013-10-21 ENCOUNTER — Telehealth: Payer: Self-pay

## 2013-10-21 ENCOUNTER — Other Ambulatory Visit: Payer: Self-pay | Admitting: Gynecology

## 2013-10-21 ENCOUNTER — Telehealth: Payer: Self-pay | Admitting: Internal Medicine

## 2013-10-21 DIAGNOSIS — R1900 Intra-abdominal and pelvic swelling, mass and lump, unspecified site: Secondary | ICD-10-CM

## 2013-10-21 NOTE — Telephone Encounter (Signed)
Spoke with patient at time of return call. Advised patient of message from Dr.Lathrop as seen below. Advised referral placed to Dr.Stephen Gross and appointment has been scheduled for August 19th at 10:15am. Patient is agreeable and verbalizes understanding.  Inform pt, should be seen by GI first to better define this mass, referral was put in this am   Routing to provider for final review. Patient agreeable to disposition. Will close encounter

## 2013-10-21 NOTE — Telephone Encounter (Signed)
Spoke with patient. Patient states that she would like to call office right back. Advise patient CT report received and recommended further evaluation with colonoscopy. Referral placed to Dr.Stephen Gross and appointment made for August 19th at 10:15am.

## 2013-10-21 NOTE — Telephone Encounter (Signed)
Spoke with patient at time of return call. Advised of appointment tomorrow at 11am with 10:45am arrival with  GI. Advised of location. Patient is agreeable to date and time.  Routing to provider for final review. Patient agreeable to disposition. Will close encounter

## 2013-10-21 NOTE — Progress Notes (Signed)
Spoke with patient about need for colonoscopy as advised by Dr.Lathrop see telephone encounter from 8/5.

## 2013-10-21 NOTE — Telephone Encounter (Signed)
Spoke with Opal Sidles from [facility] at time of incoming call for report of CT abdomen/pelvis. The previously seen pelvic mass by ultrasound and MRI appears to have migrated into the left mid to upper abdomen. This appears to be  associated with a redundant sigmoid colon, presumably on a floppy mesentery. While this does not appear to be obstructing, there is a large volume of stool throughout the colon proximal to this lesion. Differential considerations would include GI stromal tumor, lymphoma, primary adenocarcinoma of the sigmoid colon. Recommend further evaluation with colonoscopy. Report can also be seen in EPIC.

## 2013-10-21 NOTE — Telephone Encounter (Signed)
Called and Spoke with Abigail Butts at [facility].  Advised patient needs referral

## 2013-10-21 NOTE — Telephone Encounter (Signed)
Dr. Elberta Spaniel office notified

## 2013-10-21 NOTE — Telephone Encounter (Signed)
Tried to reach patient at number provided to advise spoke with [facility] and they are able to see her tomorrow at 11am with 10:45am arrival as Dr.Lathrop would like patient to be seen asap. They are located at [place] on the 3rd floor. Will try to reach patient again.

## 2013-10-21 NOTE — Telephone Encounter (Signed)
Patient scheduled for Dr. Henrene Pastor for tomorrow at 11:00.  Notes are in the system

## 2013-10-22 ENCOUNTER — Ambulatory Visit (INDEPENDENT_AMBULATORY_CARE_PROVIDER_SITE_OTHER): Payer: BC Managed Care – PPO | Admitting: Internal Medicine

## 2013-10-22 ENCOUNTER — Encounter: Payer: Self-pay | Admitting: Internal Medicine

## 2013-10-22 VITALS — BP 90/60 | HR 64 | Ht 60.75 in | Wt 137.0 lb

## 2013-10-22 DIAGNOSIS — R1084 Generalized abdominal pain: Secondary | ICD-10-CM

## 2013-10-22 DIAGNOSIS — R933 Abnormal findings on diagnostic imaging of other parts of digestive tract: Secondary | ICD-10-CM

## 2013-10-22 NOTE — Patient Instructions (Signed)

## 2013-10-22 NOTE — Progress Notes (Signed)
HISTORY OF PRESENT ILLNESS:  Nichole Davis is a 28 y.o. female with past medical history as listed below. She presents today, upon referral from Dr. Charlies Constable, for evaluation regarding abnormal abdominal imaging in the need for colonoscopy. The patient's history dates back to around March of 2014 when she was interested in termination of pregnancy. She tells me that she had a pelvic ultrasound which showed an incidental pelvic mass. I do not have that report. She had no symptoms at the time. Subsequently terminated her pregnancy in [place]. Had not been seen since and her gynecology office until June 2015 when she presented with a 3 month history of left lower quadrant pain. She underwent an MRI of the abdomen and pelvis which revealed a pelvic mass. Lack of characterization led to CT enterography which stated that the previously noted pelvic mass "migrated" into the left mid and upper abdomen. It was said to measure 5.8 x 4.1 cm and to be associated with a redundant sigmoid colon. A broad differential was provided and colonoscopy recommended. Patient tells me that her left lower quadrant discomfort is cramping in nature. No bleeding. No change in bowel habits. 10 pound weight gain. Otherwise well. Recent hemoglobin revealed anemia at 10.1. MCV 79.4.  REVIEW OF SYSTEMS:  All non-GI ROS negative except for fatigue, menstrual cramps, sleeping problems  Past Medical History  Diagnosis Date  . Anxiety   . GERD (gastroesophageal reflux disease)   . Chest pain     pt. seen at [facility] 07/12/2011, for chest pain, cleared medically, given  Lorazepam & prilosec.  Took Lorazepam & had good relief fr. it, has not taken Prilosec  . PONV (postoperative nausea and vomiting)   . Anemia   . 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 STD (sexually transmitted disease)     trichomonas 8/09, chlamydia 2014    Past Surgical History  Procedure Laterality Date  . Lymph node biopsy Left 2010   left side neck  . Bone marrow harvest  2005  . Retinal detachment repair w/ scleral buckle le Right 07/17/11    eye  . Scleral buckle  07/17/2011    Procedure: SCLERAL BUCKLE;  Surgeon: Hayden Pedro, MD;  Location: [place];  Service: Ophthalmology;  Laterality: Right;  . Retinal tear repair cryotherapy Left   . Implanon removal Left     removed 2011, arm    Social History [name]  reports that she has never smoked. She has never used smokeless tobacco. She reports that she drinks alcohol. She reports that she does not use illicit drugs.  family history includes Leukemia in her brother. There is no history of Anesthesia problems.  No Known Allergies     PHYSICAL EXAMINATION: Vital signs: BP 90/60  Pulse 64  Ht 5' 0.75" (1.543 m)  Wt 137 lb (62.143 kg)  BMI 26.10 kg/m2  LMP 10/18/2013  Constitutional: generally well-appearing, no acute distress Psychiatric: alert and oriented x3, cooperative Eyes: extraocular movements intact, anicteric, conjunctiva pink Mouth: oral pharynx moist, no lesions Neck: supple no lymphadenopathy Cardiovascular: heart regular rate and rhythm, no murmur Lungs: clear to auscultation bilaterally Abdomen: soft, nontender, nondistended, no obvious ascites, no peritoneal signs, normal bowel sounds, no organomegaly Rectal: Deferred until colonoscopy Extremities: no lower extremity edema bilaterally Skin: no lesions on visible extremities Neuro: No focal deficits.   ASSESSMENT:  #1. Pelvic mass. Etiology unclear. Question some relationship with the sigmoid colon #2. A 3  month history of left lower quadrant pain. Presumably due to the same #3. Microcytic anemia   PLAN:  #1. Colonoscopy.The nature of the procedure, as well as the risks, benefits, and alternatives were carefully and thoroughly reviewed with the patient. Ample time for discussion and questions allowed. The patient understood, was satisfied, and agreed to proceed. Suprep. #2.  Further plans to be determined thereafter. She will likely need some form of surgery

## 2013-10-23 ENCOUNTER — Telehealth: Payer: Self-pay | Admitting: Certified Nurse Midwife

## 2013-10-23 NOTE — Telephone Encounter (Signed)
Pt wants to talk with the nurse no information given. °

## 2013-10-23 NOTE — Telephone Encounter (Signed)
Spoke with patient. Patient would like to know if she needs to keep appointment on 8/19 after seeing GI. Advised patient she will still need to keep appointment as this is to see another office. Advised it is important to keep appointment. Patient has seen GI and is scheduled for colonoscopy.  Routing to provider for final review. Patient agreeable to disposition. Will close encounter

## 2013-10-28 ENCOUNTER — Telehealth: Payer: Self-pay | Admitting: Certified Nurse Midwife

## 2013-10-28 NOTE — Telephone Encounter (Signed)
Patient calling saying she got a positive preg test says her Last cycle was 09/23/13. Patient requst appt for next Wednesday i offered her some appts in afternoon because she has appt scheduled at [facility] surgery that dr lathrop scheduled her she is saying she doesn't want to keep the surgery appt and wants to come here. Pt

## 2013-10-28 NOTE — Telephone Encounter (Signed)
Spoke with patient. Appointment scheduled for Aug 14th at [facility]. Patient agreeable to date and time.  Routing to provider for final review. Patient agreeable to disposition. Will close encounter

## 2013-10-29 ENCOUNTER — Encounter: Payer: Self-pay | Admitting: Internal Medicine

## 2013-10-30 ENCOUNTER — Ambulatory Visit (INDEPENDENT_AMBULATORY_CARE_PROVIDER_SITE_OTHER): Payer: BC Managed Care – PPO | Admitting: Gynecology

## 2013-10-30 VITALS — BP 104/74 | HR 68 | Resp 18 | Ht 60.75 in | Wt 137.0 lb

## 2013-10-30 DIAGNOSIS — N912 Amenorrhea, unspecified: Secondary | ICD-10-CM

## 2013-10-30 DIAGNOSIS — R1900 Intra-abdominal and pelvic swelling, mass and lump, unspecified site: Secondary | ICD-10-CM

## 2013-10-30 LAB — POCT URINE PREGNANCY: Preg Test, Ur: POSITIVE

## 2013-10-30 LAB — HCG, QUANTITATIVE, PREGNANCY: HCG, BETA CHAIN, QUANT, S: 274.8 m[IU]/mL

## 2013-10-30 NOTE — Addendum Note (Signed)
Addended by: Elveria Rising on: 10/30/2013 04:10 PM   Modules accepted: Orders

## 2013-10-30 NOTE — Progress Notes (Signed)
28yo [id number], Pt here with +UPT.  Pt was using condoms every time.  Pt is in the midst of evaluation of a 5cm pelvic mass.  The Ddx includes GI stromal tumor, lymphoma and adenocarcinoma.  Pt has had a recent CT 8/5 based on spotting that began 8/2.  Pt did UPT 10/27/13 based on some nausea.  No bleeding since the spotting. Pt has a colonoscopy scheduled for 12/02/13 with Dr Henrene Pastor and has her first appointment with general surgery 11/04/13.   This is an unplanned pregnancy,  Pt reports that she is having some issues with pain from the mass. Pt terminated a prior pregnancy in 2014, used medical abortion with no complications. Pt at this time does not want to put her health in jeopardy and is having issues with discomfort now. I suggest she keep her appt with GS and review the differential with him and his recommendations regarding delay in dx and treatment, she cannot have colonoscopy while pregnant. She should let us know regarding her GS appt and her desires to continue We discussed the all or nothing affect of radiation very early in pregnancy, we will get a HCG today.  She is A+. Questions addressed support given. 66m spent counseling, >50% face to face

## 2013-11-04 ENCOUNTER — Other Ambulatory Visit (INDEPENDENT_AMBULATORY_CARE_PROVIDER_SITE_OTHER): Payer: BC Managed Care – PPO

## 2013-11-04 ENCOUNTER — Encounter (INDEPENDENT_AMBULATORY_CARE_PROVIDER_SITE_OTHER): Payer: Self-pay | Admitting: Surgery

## 2013-11-04 ENCOUNTER — Telehealth: Payer: Self-pay | Admitting: Internal Medicine

## 2013-11-04 ENCOUNTER — Other Ambulatory Visit: Payer: Self-pay | Admitting: Gynecology

## 2013-11-04 ENCOUNTER — Ambulatory Visit (INDEPENDENT_AMBULATORY_CARE_PROVIDER_SITE_OTHER): Payer: BC Managed Care – PPO | Admitting: Surgery

## 2013-11-04 VITALS — BP 122/70 | HR 75 | Temp 98.6°F | Ht 60.0 in | Wt 139.0 lb

## 2013-11-04 DIAGNOSIS — Z3490 Encounter for supervision of normal pregnancy, unspecified, unspecified trimester: Secondary | ICD-10-CM

## 2013-11-04 DIAGNOSIS — Z331 Pregnant state, incidental: Secondary | ICD-10-CM

## 2013-11-04 DIAGNOSIS — R194 Change in bowel habit: Secondary | ICD-10-CM

## 2013-11-04 DIAGNOSIS — N912 Amenorrhea, unspecified: Secondary | ICD-10-CM

## 2013-11-04 DIAGNOSIS — K921 Melena: Secondary | ICD-10-CM

## 2013-11-04 DIAGNOSIS — R198 Other specified symptoms and signs involving the digestive system and abdomen: Secondary | ICD-10-CM

## 2013-11-04 DIAGNOSIS — R1900 Intra-abdominal and pelvic swelling, mass and lump, unspecified site: Secondary | ICD-10-CM

## 2013-11-04 LAB — HCG, SERUM, QUALITATIVE: PREG SERUM: POSITIVE

## 2013-11-04 NOTE — Telephone Encounter (Signed)
Pt found out she is pregnant 10/30/13. Per Dr. Brion Aliment note the pt cannot have a colon while pregnant. Pt saw [facility] Dr. Johney Maine today and states she was told he cannot proceed with dx/tx until colon is done. Pt is confused and does not know what to do regarding colon. Please advise.

## 2013-11-04 NOTE — Progress Notes (Signed)
Subjective:     Patient ID: Nichole Davis, female   DOB: [birth year], 28 y.o.   MRN: [id number]  HPI  Note: Portions of this report may have been transcribed using voice recognition software. Every effort was made to ensure accuracy; however, inadvertent computerized transcription errors may be present.   Any transcriptional errors that result from this process are unintentional.            Nichole Davis  Feb 03, 1986 [id number]  Patient Care Team: No Pcp Per Patient as PCP - General (General Practice) Azalia Bilis, MD as Consulting Physician (Obstetrics and Gynecology) Adin Hector, MD as Consulting Physician (General Surgery) Irene Shipper, MD as Consulting Physician (Gastroenterology)  This patient is a 28 y.o.female who presents today for surgical evaluation at the request of Dr. Charlies Constable.   Reason for visit: Intraperitoneal abdominal mass.  Initially thought to be pelvic.  Now probably sigmoid colon  Pleasant young female.  Vague abdominal pain.  Felt to be lower.  Had ultrasound suspicious for pelvic mass.  MRI noted 5 cm pelvic mass.  Possibly associated with bowel vs. Ovary.  I recommended with radiology for CT enterrhography.  Study season mass but now flipped up in left midabdomen.  Seems more associated with sigmoid colon.  Surgical consultation requested.  I recommended colonoscopy.  She is due to get that by Dr. Henrene Pastor in a few weeks.  Patient otherwise rather healthy and active.  Normally has a bowel movement every day.  However now the stools are a little more watery.  She has seen blood in the stool.  Abdominal discomfort and bloating with eating.  That has been a change in the past few months.  Normally walks 30-60 minutes without difficulty.  Rarely drinks alcohol.  No abdominal surgery.  No personal nor family history of GI/colon cancer, inflammatory bowel disease, irritable bowel syndrome, allergy such as Celiac Sprue, dietary/dairy problems, colitis, ulcers nor  gastritis.  No recent sick contacts/gastroenteritis.  No travel outside the country.  No changes in diet.  No dysphagia to solids or liquids.  No significant heartburn or reflux.  No hematochezia, hematemesis, coffee ground emesis.  No evidence of prior gastric/peptic ulceration.  She just found out she was pregnant with a urine pregnancy test last week  Patient Active Problem List   Diagnosis Date Noted  . Pregnancy, +UPT 10/27/2013   11/04/2013  . Hematochezia 11/04/2013  . Change in bowel habits 11/04/2013  . Abdominal mass ?sigmoid colon 08/21/2013  . Vaginal discharge 07/01/2012  . Exposure to STD 07/01/2012    Past Medical History  Diagnosis Date  . Anxiety   . GERD (gastroesophageal reflux disease)   . Chest pain     pt. seen at [facility] 07/12/2011, for chest pain, cleared medically, given  Lorazepam & prilosec.  Took Lorazepam & had good relief fr. it, has not taken Prilosec  . PONV (postoperative nausea and vomiting)   . Anemia   . 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 STD (sexually transmitted disease)     trichomonas 8/09, chlamydia 2014  . Vaginal discharge 07/01/2012  . 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 Lymph node biopsy Left 2010    left side neck  . Bone marrow harvest  2005  . Retinal  detachment repair w/ scleral buckle le Right 07/17/11    eye  . Scleral buckle  07/17/2011    Procedure: SCLERAL BUCKLE;  Surgeon: Hayden Pedro, MD;  Location: [place];  Service: Ophthalmology;  Laterality: Right;  . Retinal tear repair cryotherapy Left   . Implanon removal Left     removed 2011, arm    History   Social History  . Marital Status: Single    Spouse Name: N/A    Number of Children: 0  . Years of Education: N/A   Occupational History  . customer service    Social History Main Topics  . Smoking  status: Never Smoker   . Smokeless tobacco: Never Used  . Alcohol Use: Yes     Comment: socail  . Drug Use: No  . Sexual Activity: Yes    Partners: Male    Birth Control/ Protection: Condom   Other Topics Concern  . Not on file   Social History Narrative  . No narrative on file    Family History  Problem Relation Age of Onset  . Anesthesia problems Neg Hx   . Leukemia Brother     No current outpatient prescriptions on file.   No current facility-administered medications for this visit.     No Known Allergies  BP 122/70  Pulse 75  Temp(Src) 98.6 F (37 C)  Ht 5' (1.524 m)  Wt 139 lb (63.05 kg)  BMI 27.15 kg/m2  LMP 09/24/2013  Ct Entero Abd/pelvis W/cm   10/20/2013   CLINICAL DATA:  Evaluate pelvic mass.  EXAM: CT ABDOMEN AND PELVIS WITH CONTRAST (ENTEROGRAPHY)  TECHNIQUE: Multidetector CT of the abdomen and pelvis during bolus administration of intravenous contrast. Negative oral contrast VoLumen was given.  CONTRAST:  133mL OMNIPAQUE IOHEXOL 300 MG/ML  SOLN  COMPARISON:  Pelvic MRI 09/12/2013.  Pelvic ultrasound 09/01/2013  FINDINGS: Lung bases are clear.  No effusions.  Heart is normal size.  Liver, gallbladder, spleen, pancreas, adrenals and kidneys are normal.  The previously described pelvic mass by MRI is not visualized in the pelvis in the same location as seen on prior MRI. However, similar shaped and size mass is seen in the left mid to upper abdomen and appears to be intimately associated with a redundant sigmoid colon, which is presumably on a floppy mesentery. On coronal image 68, this measures 5.8 x 4.1 cm. This is seen on axial image 72 measuring 4.2 x 4.0 cm. This does not appear to be related to the small bowel. Small bowel is normal caliber. No abnormal areas of enhancement. No small bowel stricture.  Large stool burden noted throughout the colon.  Liver, gallbladder, spleen, pancreas, adrenals and kidneys are normal. Aorta is normal caliber.  Small rim  enhancing corpus luteal cyst in the left ovary. Right ovary and adnexa unremarkable. No free fluid, free air or adenopathy. Aorta is normal caliber.  No acute bony abnormality or focal bone lesion.  IMPRESSION: The previously seen pelvic mass by ultrasound and MRI appears to have migrated into the left mid to upper abdomen. This appears to be associated with a redundant sigmoid colon, presumably on a floppy mesentery. While this does not appear to be obstructing, there is a large volume of stool throughout the colon proximal to this lesion. Differential considerations would include GI stromal tumor, lymphoma, primary adenocarcinoma of the sigmoid colon. Recommend further evaluation with colonoscopy.  These results will be called to the ordering clinician or representative by the Radiologist Assistant, and  communication documented in the PACS or zVision Dashboard.   Electronically Signed   By: Rolm Baptise M.D.   On: 10/20/2013 17:27   CLINICAL DATA: Pain and left lower pelvis for several months.  Pelvic ultrasound demonstrating mass between the ovary and uterus.  EXAM:  MRI PELVIS WITHOUT AND WITH CONTRAST  TECHNIQUE:  Multiplanar multisequence MR imaging of the pelvis was performed  both before and after administration of intravenous contrast.  CONTRAST: 75mL MULTIHANCE GADOBENATE DIMEGLUMINE 529 MG/ML IV SOLN  COMPARISON: 09/01/2013 pelvic ultrasound. Report not available.  FINDINGS:  No pelvic adenopathy. Normal appearance of the urinary bladder.  Endometrial thickness 1.4 cm, upper normal. The uterus is otherwise  within normal limits with small nabothian cysts incidentally in  noted.  The left ovary is normal, including on image 11/series 4. The right  ovary is identified on image 12/series 5.  Soft tissue signal, macro lobulated, moderately enhancing lesion is  identified within the anterior right paracentral pelvis. This is  separate from the anterior aspect of the uterus, including on  series  3. It may arise exophytic off the medial aspect of the right ovary  (questionable small communication image 13/series 4) or from the  serosal surface of the adjacent pelvic small bowel loops.  Trace free pelvic fluid is likely physiologic.  IMPRESSION:  1. A right paracentral solid, enhancing pelvic lesion which is  separate from the uterus. This may arise from the right ovary  exophytically and represent a solid neoplasm or represent a  gastrointestinal tract based lesion such as a Meckel's diverticulum  or GI stromal tumor. Consider laparoscopy. Alternatively, further  evaluation with CT enterography may be informative.  2. Endometrial thickness upper normal at 1.4 cm. Correlate with  symptoms of excessive menstrual bleeding.  3. Trace free pelvic fluid is likely physiologic.  Electronically Signed  By: Abigail Miyamoto M.D.  On: 09/13/2013 13:46         Review of Systems  Constitutional: Negative for fever, chills, diaphoresis, appetite change and fatigue.  HENT: Negative for ear discharge, ear pain, sore throat and trouble swallowing.   Eyes: Negative for photophobia, discharge and visual disturbance.  Respiratory: Negative for cough, choking, chest tightness and shortness of breath.   Cardiovascular: Negative for chest pain and palpitations.  Gastrointestinal: Positive for abdominal pain, diarrhea, constipation and blood in stool. Negative for nausea, vomiting, anal bleeding and rectal pain.  Endocrine: Negative for cold intolerance and heat intolerance.  Genitourinary: Negative for dysuria, frequency and difficulty urinating.  Musculoskeletal: Negative for gait problem, myalgias and neck pain.  Skin: Negative for color change, pallor and rash.  Allergic/Immunologic: Negative for environmental allergies, food allergies and immunocompromised state.  Neurological: Negative for dizziness, speech difficulty, weakness and numbness.  Hematological: Negative for adenopathy.   Psychiatric/Behavioral: Negative for confusion and agitation. The patient is not nervous/anxious.        Objective:   Physical Exam  Constitutional: She is oriented to person, place, and time. She appears well-developed and well-nourished. No distress.  HENT:  Head: Normocephalic.  Mouth/Throat: Oropharynx is clear and moist. No oropharyngeal exudate.  Eyes: Conjunctivae and EOM are normal. Pupils are equal, round, and reactive to light. No scleral icterus.  Neck: Normal range of motion. Neck supple. No tracheal deviation present.  Cardiovascular: Normal rate, regular rhythm and intact distal pulses.   Pulmonary/Chest: Effort normal and breath sounds normal. No stridor. No respiratory distress. She exhibits no tenderness.  Abdominal: Soft. She exhibits no distension and no mass.  There is no tenderness. Hernia confirmed negative in the right inguinal area and confirmed negative in the left inguinal area.  Genitourinary: No vaginal discharge found.  Musculoskeletal: Normal range of motion. She exhibits no tenderness.       Right elbow: She exhibits normal range of motion.       Left elbow: She exhibits normal range of motion.       Right wrist: She exhibits normal range of motion.       Left wrist: She exhibits normal range of motion.       Right hand: Normal strength noted.       Left hand: Normal strength noted.  Lymphadenopathy:       Head (right side): No posterior auricular adenopathy present.       Head (left side): No posterior auricular adenopathy present.    She has no cervical adenopathy.    She has no axillary adenopathy.       Right: No inguinal adenopathy present.       Left: No inguinal adenopathy present.  Neurological: She is alert and oriented to person, place, and time. No cranial nerve deficit. She exhibits normal muscle tone. Coordination normal.  Skin: Skin is warm and dry. No rash noted. She is not diaphoretic. No erythema.  Psychiatric: She has a normal mood and  affect. Her behavior is normal. Judgment and thought content normal.       Assessment:     Intraperitoneal abdominal mass initially thought to be associated with adnexa but now more suspicious for sigmoid colon by CT enterrhography.  Hematochezia and changes in bowel habits and raise suspicion of GI tract mass as well.  Pregnancy.  Just diagnosed.  First trimester.     Plan:     I think is a good idea to keep her appointment for colonoscopy.  Once that is done , plan where to go from there.  If that is completely normal, may consider other evaluation vs. Diagnostic laparoscopy.  My instinct is is this is a colonic mass that will require segmental colonic resection.  The challenge is that she is now pregnant.  Operative risks of losing the fetus there.  It is suspicious for cancer, I do not think she can wait 9 months.  We will plan in second trimester.  Possibly be due in a minimally invasive laparoscopic fashion vs. Open incision.  I will discuss with partners for internal extra opinions.  Otherwise she is low risk given her young age and health.  Major risk is loss of pregnancy.

## 2013-11-04 NOTE — Patient Instructions (Signed)
Please consider the recommendations that we have given you today:  Obtain colonoscopy to see if mass in the abdomen related to the colon.  Call our office after that is done to determine the next plan  If the mass is associated with the colon, you would benefit from colon resection to resect this mass.  Management of pregnancy prior obstetrician.  Fiber bowel regimen.  Metamucil, Citrucel, or MiraLAX.  See below  See the Handout(s) we have given you.  Please call our office at [phone] if you wish to schedule surgery or if you have further questions / concerns.   GETTING TO GOOD BOWEL HEALTH. Irregular bowel habits such as constipation and diarrhea can lead to many problems over time.  Having one soft bowel movement a day is the most important way to prevent further problems.  The anorectal canal is designed to handle stretching and feces to safely manage our ability to get rid of solid waste (feces, poop, stool) out of our body.  BUT, hard constipated stools can act like ripping concrete bricks and diarrhea can be a burning fire to this very sensitive area of our body, causing inflamed hemorrhoids, anal fissures, increasing risk is perirectal abscesses, abdominal pain/bloating, an making irritable bowel worse.     The goal: ONE SOFT BOWEL MOVEMENT A DAY!  To have soft, regular bowel movements:    Drink at least 8 tall glasses of water a day.     Take plenty of fiber.  Fiber is the undigested part of plant food that passes into the colon, acting s "natures broom" to encourage bowel motility and movement.  Fiber can absorb and hold large amounts of water. This results in a larger, bulkier stool, which is soft and easier to pass. Work gradually over several weeks up to 6 servings a day of fiber (25g a day even more if needed) in the form of: o Vegetables -- Root (potatoes, carrots, turnips), leafy green (lettuce, salad greens, celery, spinach), or cooked high residue (cabbage, broccoli,  etc) o Fruit -- Fresh (unpeeled skin & pulp), Dried (prunes, apricots, cherries, etc ),  or stewed ( applesauce)  o Whole grain breads, pasta, etc (whole wheat)  o Bran cereals    Bulking Agents -- This type of water-retaining fiber generally is easily obtained each day by one of the following:  o Psyllium bran -- The psyllium plant is remarkable because its ground seeds can retain so much water. This product is available as Metamucil, Konsyl, Effersyllium, Per Diem Fiber, or the less expensive generic preparation in drug and health food stores. Although labeled a laxative, it really is not a laxative.  o Methylcellulose -- This is another fiber derived from wood which also retains water. It is available as Citrucel. o Polyethylene Glycol - and "artificial" fiber commonly called Miralax or Glycolax.  It is helpful for people with gassy or bloated feelings with regular fiber o Flax Seed - a less gassy fiber than psyllium   No reading or other relaxing activity while on the toilet. If bowel movements take longer than 5 minutes, you are too constipated   AVOID CONSTIPATION.  High fiber and water intake usually takes care of this.  Sometimes a laxative is needed to stimulate more frequent bowel movements, but    Laxatives are not a good long-term solution as it can wear the colon out. o Osmotics (Milk of Magnesia, Fleets phosphosoda, Magnesium citrate, MiraLax, GoLytely) are safer than  o Stimulants (Senokot, Castor Oil,  Dulcolax, Ex Lax)    o Do not take laxatives for more than 7days in a row.    IF SEVERELY CONSTIPATED, try a Bowel Retraining Program: o Do not use laxatives.  o Eat a diet high in roughage, such as bran cereals and leafy vegetables.  o Drink six (6) ounces of prune or apricot juice each morning.  o Eat two (2) large servings of stewed fruit each day.  o Take one (1) heaping tablespoon of a psyllium-based bulking agent twice a day. Use sugar-free sweetener when possible to avoid  excessive calories.  o Eat a normal breakfast.  o Set aside 15 minutes after breakfast to sit on the toilet, but do not strain to have a bowel movement.  o If you do not have a bowel movement by the third day, use an enema and repeat the above steps.    Controlling diarrhea o Switch to liquids and simpler foods for a few days to avoid stressing your intestines further. o Avoid dairy products (especially milk & ice cream) for a short time.  The intestines often can lose the ability to digest lactose when stressed. o Avoid foods that cause gassiness or bloating.  Typical foods include beans and other legumes, cabbage, broccoli, and dairy foods.  Every person has some sensitivity to other foods, so listen to our body and avoid those foods that trigger problems for you. o Adding fiber (Citrucel, Metamucil, psyllium, Miralax) gradually can help thicken stools by absorbing excess fluid and retrain the intestines to act more normally.  Slowly increase the dose over a few weeks.  Too much fiber too soon can backfire and cause cramping & bloating. o Probiotics (such as active yogurt, Align, etc) may help repopulate the intestines and colon with normal bacteria and calm down a sensitive digestive tract.  Most studies show it to be of mild help, though, and such products can be costly. o Medicines:   Bismuth subsalicylate (ex. Kayopectate, Pepto Bismol) every 30 minutes for up to 6 doses can help control diarrhea.  Avoid if pregnant.   Loperamide (Immodium) can slow down diarrhea.  Start with two tablets (4mg  total) first and then try one tablet every 6 hours.  Avoid if you are having fevers or severe pain.  If you are not better or start feeling worse, stop all medicines and call your doctor for advice o Call your doctor if you are getting worse or not better.  Sometimes further testing (cultures, endoscopy, X-ray studies, bloodwork, etc) may be needed to help diagnose and treat the cause of the diarrhea.

## 2013-11-05 ENCOUNTER — Telehealth: Payer: Self-pay | Admitting: Gynecology

## 2013-11-05 ENCOUNTER — Telehealth (INDEPENDENT_AMBULATORY_CARE_PROVIDER_SITE_OTHER): Payer: Self-pay

## 2013-11-05 DIAGNOSIS — Z01812 Encounter for preprocedural laboratory examination: Secondary | ICD-10-CM

## 2013-11-05 LAB — HCG, QUANTITATIVE, PREGNANCY: hCG, Beta Chain, Quant, S: 802 m[IU]/mL

## 2013-11-05 NOTE — Telephone Encounter (Signed)
Patient calling to check on lab results. States surgeon states next step is colonoscopy but Dr Charlies Constable said she couldn't have colonoscopy due to pregnancy. Asking for results to see if pregnancy seems to be progressing. Quant pending, advised will check on result and call her back.  Routed to Dr Sabra Heck for review since dr Charlies Constable out of office.

## 2013-11-05 NOTE — Telephone Encounter (Signed)
Patient has questions about her follow up and her results

## 2013-11-05 NOTE — Telephone Encounter (Signed)
Pt called in stating when she saw Dr. Johney Maine yesterday they discussed her getting a colonoscopy. According to pt, Dr. Charlies Constable told her she is completely against her getting a colonoscopy done since she is pregnant, even in her second trimester. They discussed the possible need to terminate the pregnancy to move forward with colonoscopy and colon surgery. She is calling to get Dr Johney Maine' opinion on her getting the colonoscopy vs not getting one and his thoughts on the need to possibly terminate the pregnancy before a final decision is made. Patient can be reached at [phone].

## 2013-11-06 ENCOUNTER — Other Ambulatory Visit: Payer: Self-pay | Admitting: Obstetrics & Gynecology

## 2013-11-06 DIAGNOSIS — Z01812 Encounter for preprocedural laboratory examination: Secondary | ICD-10-CM

## 2013-11-06 NOTE — Telephone Encounter (Signed)
Spoke with patient. Lab appointment scheduled for serum hcg quant. She is agreeable to appointment.

## 2013-11-06 NOTE — Addendum Note (Signed)
Addended by: Michele Mcalpine on: 11/06/2013 11:27 AM   Modules accepted: Orders

## 2013-11-06 NOTE — Telephone Encounter (Signed)
Message copied by Michele Mcalpine on Fri Nov 06, 2013 11:31 AM ------      Message from: Megan Salon      Created: Thu Nov 05, 2013  6:51 PM       Spoke with pt.  She is aware result is going up but a little lower than normal.  Spoke with Dr. Collene Mares.  She will speak with anesthesia about situation.  I will communicate with MFM.  Hopefully can proceed with colonoscopy and wait until beginning of 2nd trimester for surgery.  Pt does want to keep pregnancy if possible.  Reviewed all results with pt.  Repeat HCG Monday--please call pt to schedule.  Will determine when should do u/s based on that value. ------

## 2013-11-09 ENCOUNTER — Other Ambulatory Visit (INDEPENDENT_AMBULATORY_CARE_PROVIDER_SITE_OTHER): Payer: BC Managed Care – PPO

## 2013-11-09 ENCOUNTER — Telehealth: Payer: Self-pay | Admitting: Gynecology

## 2013-11-09 ENCOUNTER — Telehealth (INDEPENDENT_AMBULATORY_CARE_PROVIDER_SITE_OTHER): Payer: Self-pay | Admitting: *Deleted

## 2013-11-09 ENCOUNTER — Telehealth: Payer: Self-pay | Admitting: *Deleted

## 2013-11-09 ENCOUNTER — Telehealth (INDEPENDENT_AMBULATORY_CARE_PROVIDER_SITE_OTHER): Payer: Self-pay | Admitting: Surgery

## 2013-11-09 DIAGNOSIS — Z01812 Encounter for preprocedural laboratory examination: Secondary | ICD-10-CM

## 2013-11-09 DIAGNOSIS — N912 Amenorrhea, unspecified: Secondary | ICD-10-CM

## 2013-11-09 LAB — HCG, QUANTITATIVE, PREGNANCY: hCG, Beta Chain, Quant, S: 4418.6 m[IU]/mL

## 2013-11-09 NOTE — Telephone Encounter (Signed)
Per dr Sabra Heck, advise of appropriate rise in BHCG, need to schedule PUS in office to confirm dates.   Call to patient, she is at work and will need to call back in a few minutes.

## 2013-11-09 NOTE — Telephone Encounter (Signed)
error 

## 2013-11-09 NOTE — Telephone Encounter (Signed)
Note: Portions of this report may have been transcribed using voice recognition software. Every effort was made to ensure accuracy; however, inadvertent computerized transcription errors may be present.   Any transcriptional errors that result from this process are unintentional.            Nichole Davis  1985/03/29 [id number]  Patient Care Team: No Pcp Per Patient as PCP - General (General Practice) Adin Hector, MD as Consulting Physician (General Surgery) Irene Shipper, MD as Consulting Physician (Gastroenterology) Lyman Speller, MD as Consulting Physician (Gynecology) Juanita Craver, MD as Consulting Physician (Gastroenterology)  This patient is a 28 y.o.female who presents today for surgical evaluation at the request of Dr. Edwinna Areola.   Reason for call: Discussion plans.  Received a call from the patient's new obstetrician.  Patient wanted second opinion.  Dr. Edwinna Areola assuming care.  Discussion made with [facility].  Because suspicion of intraluminal colon mass very high, okay to proceed with colonoscopy.  Probably with Dr. Juanita Craver.  Based on those results, we will go from there.  If there is an intraluminal mass suspicious for cancer, Dr. Sabra Heck would prefer resection be delayed into early second trimester, just after 13 weeks to give best chance of fetal survival.  Patient to have ultrasound to determine more specific age of fetus  Therefore we will proceed with colonoscopy first & then regroup.  Dr. Sabra Heck said she would help coordinate and discuss with patient.  We left a message to discuss with the patient to make sure that the patient is aware of the plans.  Patient Active Problem List   Diagnosis Date Noted  . Pregnancy, +UPT 10/27/2013   11/04/2013  . Hematochezia 11/04/2013  . Change in bowel habits 11/04/2013  . Abdominal mass ?sigmoid colon 08/21/2013  . Vaginal discharge 07/01/2012  . Exposure to STD 07/01/2012    Past Medical  History  Diagnosis Date  . Anxiety   . GERD (gastroesophageal reflux disease)   . Chest pain     pt. seen at [facility] 07/12/2011, for chest pain, cleared medically, given  Lorazepam & prilosec.  Took Lorazepam & had good relief fr. it, has not taken Prilosec  . PONV (postoperative nausea and vomiting)   . Anemia   . 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 STD (sexually transmitted disease)     trichomonas 8/09, chlamydia 2014  . Vaginal discharge 07/01/2012  . 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 Lymph node biopsy Left 2010    left side neck  . Bone marrow harvest  2005  . Retinal detachment repair w/ scleral buckle le Right 07/17/11    eye  . Scleral buckle  07/17/2011    Procedure: SCLERAL BUCKLE;  Surgeon: Hayden Pedro, MD;  Location: [place];  Service: Ophthalmology;  Laterality: Right;  . Retinal tear repair cryotherapy Left   . Implanon removal Left     removed 2011, arm    History   Social History  . Marital Status: Single    Spouse Name: N/A    Number of Children: 0  . Years of Education: N/A   Occupational History  . customer service    Social History Main Topics  . Smoking status: Never Smoker   .  Smokeless tobacco: Never Used  . Alcohol Use: Yes     Comment: socail  . Drug Use: No  . Sexual Activity: Yes    Partners: Male    Birth Control/ Protection: Condom   Other Topics Concern  . Not on file   Social History Narrative  . No narrative on file    Family History  Problem Relation Age of Onset  . Anesthesia problems Neg Hx   . Leukemia Brother     No current outpatient prescriptions on file.   No current facility-administered medications for this visit.     No Known Allergies  @VS @  Ct Entero Abd/pelvis W/cm  10/20/2013   CLINICAL DATA:  Evaluate pelvic mass.  EXAM: CT ABDOMEN AND  PELVIS WITH CONTRAST (ENTEROGRAPHY)  TECHNIQUE: Multidetector CT of the abdomen and pelvis during bolus administration of intravenous contrast. Negative oral contrast VoLumen was given.  CONTRAST:  154mL OMNIPAQUE IOHEXOL 300 MG/ML  SOLN  COMPARISON:  Pelvic MRI 09/12/2013.  Pelvic ultrasound 09/01/2013  FINDINGS: Lung bases are clear.  No effusions.  Heart is normal size.  Liver, gallbladder, spleen, pancreas, adrenals and kidneys are normal.  The previously described pelvic mass by MRI is not visualized in the pelvis in the same location as seen on prior MRI. However, similar shaped and size mass is seen in the left mid to upper abdomen and appears to be intimately associated with a redundant sigmoid colon, which is presumably on a floppy mesentery. On coronal image 68, this measures 5.8 x 4.1 cm. This is seen on axial image 72 measuring 4.2 x 4.0 cm. This does not appear to be related to the small bowel. Small bowel is normal caliber. No abnormal areas of enhancement. No small bowel stricture.  Large stool burden noted throughout the colon.  Liver, gallbladder, spleen, pancreas, adrenals and kidneys are normal. Aorta is normal caliber.  Small rim enhancing corpus luteal cyst in the left ovary. Right ovary and adnexa unremarkable. No free fluid, free air or adenopathy. Aorta is normal caliber.  No acute bony abnormality or focal bone lesion.  IMPRESSION: The previously seen pelvic mass by ultrasound and MRI appears to have migrated into the left mid to upper abdomen. This appears to be associated with a redundant sigmoid colon, presumably on a floppy mesentery. While this does not appear to be obstructing, there is a large volume of stool throughout the colon proximal to this lesion. Differential considerations would include GI stromal tumor, lymphoma, primary adenocarcinoma of the sigmoid colon. Recommend further evaluation with colonoscopy.  These results will be called to the ordering clinician or  representative by the Radiologist Assistant, and communication documented in the PACS or zVision Dashboard.   Electronically Signed   By: Rolm Baptise M.D.   On: 10/20/2013 17:27

## 2013-11-09 NOTE — Telephone Encounter (Signed)
N/A when tried calling the pt's home #. Dr Johney Maine did speak to Dr Edwinna Areola and they both agreed for the pt to be worked up for the colon mass. Dr Sabra Heck is going to call pt to give her his information. Dr Sabra Heck plans on getting u/s to see how far long the pt is currently in her pregnancy then have Dr Collene Mares do the colonoscopy. We will wait for the results to see about surgery.

## 2013-11-09 NOTE — Telephone Encounter (Signed)
Patient returned call approximately 4pm. Advised of lab results showing appropriate increase and recommendation for PUS and consult here on Thursday. Patient agreeable and appointment scheduled for 11-13-13 at 1030.  To Sabrina for precert.   Routing to provider for final review. Patient agreeable to disposition. Will close encounter

## 2013-11-10 NOTE — Telephone Encounter (Signed)
Patient is scheduled for pelvic ultrasound. Will routed to provider for signature and close encounter.

## 2013-11-11 NOTE — Telephone Encounter (Signed)
Called Dr Henrene Pastor regarding pelvic/colonic mass that was noted on routine exam.  Pt had been referred for colonoscopy but has recently turned up pregnant.  She was ambivalent regarding this pregnancy but has not determined to terminate as of yet.  Dr Henrene Pastor states that despite the pregnancy, he would still be able to do a colonoscopy, modifications regarding location and prep being made.  I expressed that it should be ok from a pregnancy perspective, and agree to wait until the second trimester.  Mass noted several years ago and appears stable by imaging but that waiting 58m not recommended.

## 2013-11-12 ENCOUNTER — Ambulatory Visit (INDEPENDENT_AMBULATORY_CARE_PROVIDER_SITE_OTHER): Payer: BC Managed Care – PPO

## 2013-11-12 ENCOUNTER — Other Ambulatory Visit: Payer: Self-pay | Admitting: Obstetrics & Gynecology

## 2013-11-12 ENCOUNTER — Ambulatory Visit (INDEPENDENT_AMBULATORY_CARE_PROVIDER_SITE_OTHER): Payer: BC Managed Care – PPO | Admitting: Obstetrics & Gynecology

## 2013-11-12 VITALS — BP 116/72 | Wt 138.0 lb

## 2013-11-12 DIAGNOSIS — R1900 Intra-abdominal and pelvic swelling, mass and lump, unspecified site: Secondary | ICD-10-CM

## 2013-11-12 DIAGNOSIS — N912 Amenorrhea, unspecified: Secondary | ICD-10-CM

## 2013-11-12 LAB — US OB TRANSVAGINAL

## 2013-11-12 NOTE — Progress Notes (Addendum)
28 y.o.Singlefemale here for a pelvic ultrasound for dating and viability.  Pt has solid bowel mass.  Needs colonoscopy performed.  Has decided she would like to try and keep pregnancy if at all possible.    Have spoke with MFM regarding timing of needed procedures.  Advised to proceed based on recommendations of general surgery, Dr. Michael Boston.  Surgery should be planned in beginning second trimester if at all possible.    Patient's last menstrual period was 09/24/2013.   FINDINGS: UTERUS: gestational sac measuring 5 3/7 weeks noted.  No yolk sac or embryo noted today ADNEXA:   Left ovary with 14 x 76mm corpus luteal cyst   Right ovary normal CUL DE SAC: no free fluid  48x 37mm solid pelvic mass located left of uterus, with positive doppler flow  Findings d/w pt.  Feel should be seeing yolk sac, at least, by this time.  Dating by LMP is not reliable but with size of gestational sac today, I still feel yolk sac, if not early fetal pole, should be evident.  Plan repeat PUS with stat HCG, same day, in 14 days.  At that time, will be able to better determine viability.  Assessment:  Early pregnancy, dating unsure Solid bowel mass with vascular flow, concerning for malignancy  Plan: Have spoken with MFM, general surgery, and GI.   Repeat PUS with HCG two weeks.  If non viable pregnancy, proceed with D&C.  If viable, will proceed with colonoscopy which is tentatively scheduled at this time.  Surgery will need to be at beginning of second trimester if viable pregnancy present.  Pt clearly understands she needs surgery which will probably involve a bowel resection.  She clearly understands this could be cancer requiring additional treatment like chemotherapy.  She clearly understands there are risks with doing any procedures/testing involving any medications but I feel this needs to proceed for diagnosis and proceeding with therapy.  She understands risks could include problems with her pregnancy,  birth defects, growth problems with the pregnancy, and fetal demise.  Pt also understands she can change her mind at anytime until 20 weeks if there are treatment needs for her personally that would be detrimental to a pregnancy.  Pt accompanied by boyfriend.  All questions answered.  Advised starting PNV.  ~30 minutes spent with patient >50% of time was in face to face discussion of above.

## 2013-11-15 ENCOUNTER — Encounter: Payer: Self-pay | Admitting: Obstetrics & Gynecology

## 2013-11-26 ENCOUNTER — Other Ambulatory Visit: Payer: Self-pay | Admitting: Obstetrics & Gynecology

## 2013-11-26 ENCOUNTER — Ambulatory Visit (INDEPENDENT_AMBULATORY_CARE_PROVIDER_SITE_OTHER): Payer: BC Managed Care – PPO

## 2013-11-26 ENCOUNTER — Ambulatory Visit (INDEPENDENT_AMBULATORY_CARE_PROVIDER_SITE_OTHER): Payer: BC Managed Care – PPO | Admitting: Obstetrics & Gynecology

## 2013-11-26 VITALS — BP 104/68 | Ht 60.75 in | Wt 139.0 lb

## 2013-11-26 DIAGNOSIS — O2691 Pregnancy related conditions, unspecified, first trimester: Secondary | ICD-10-CM

## 2013-11-26 DIAGNOSIS — N912 Amenorrhea, unspecified: Secondary | ICD-10-CM

## 2013-11-26 DIAGNOSIS — O2690 Pregnancy related conditions, unspecified, unspecified trimester: Secondary | ICD-10-CM

## 2013-11-26 DIAGNOSIS — R1900 Intra-abdominal and pelvic swelling, mass and lump, unspecified site: Secondary | ICD-10-CM

## 2013-11-26 DIAGNOSIS — O0289 Other abnormal products of conception: Secondary | ICD-10-CM

## 2013-11-26 DIAGNOSIS — O020 Blighted ovum and nonhydatidiform mole: Secondary | ICD-10-CM

## 2013-11-26 LAB — US OB TRANSVAGINAL

## 2013-11-26 NOTE — Progress Notes (Signed)
28 y.o. G3P0A2 Singlefemale here for a pelvic ultrasound to assess for interval growth of pregnancy.  Pt has unsure LMP of 09/24/13.  Ultrasound performed 11/12/13 which would be around 7 weeks, if LMP is correct for dating, showed only a small gestational sac.  Pt did have +UPT 10/27/13 which makes dating by LMP seem appropriate.  However, at last PUS, I was not comfortable proceeding with any termination procedure as long as patient wished to proceed with pregnancy as she could just have incorrect dating.  Pt does have a pelvic mass that needs excision but pt desirous of continuing with pregnancy if everything appears normal.  Here for interval PUS.  Denies vaginal bleeding.  Continues to have dull abdominal/left pelvic pain.  No change.  Pt is taking a PNV and off any other medications.  Patient's last menstrual period was 09/24/2013.  FINDINGS: UTERUS: with single gestational sac measuring 1.2cm, no yolk sac, no embryo.  Change in size in last 14 days has been approximately 4 days of interval change.  This is consistent with anembryonic pregnancy. ADNEXA:   Left ovary 2.6 x 2.3cm   Right ovary 2.7 x 1.3cm with 1.4 x 1.1cm corpus luteal cyst CUL DE SAC: no free fluid   Discussed findings with pt.  By LMP, she should be [redacted] weeks along with pregnancy which should show normal embryo and normal heart beat.  Even assuming that dating is not correct, pt's ultrasound has only changed about four days in growth of gestational sac in the last 5.  That is highly atypical and not consistent with normally developing pregnancy.  She is aware I feel comfortable, at this point, offering D&E for anembryonic pregnancy.  Pt tearful and appropriate.  Procedure discussed.  Risks of bleeding, infection, uterine perforation, Ashermann's syndrome, infertility, risk that tissue could be left requiring need for future procedures.  All questions answered.   As pt has bowel mass that needs removal, will plan to proceed with  colonoscopy with Dr. Henrene Pastor as long as he is ok with proceeding two days after D&E.  Pt has already gone for pre-procedure nurse visit and has prep for colonoscopy at home.  Assessment:  Nonviable pregnancy with size < dates consistent with anembryonic pregnancy Bowel mass  Plan: Suction D&E planned for Monday 11/30/13 Colonoscopy next week.  Depending on results, surgery for excision of mass will be planned with Dr. Johney Maine.  ~25 minutes spent with patient >50% of time was in face to face discussion of above.

## 2013-11-27 ENCOUNTER — Encounter (HOSPITAL_COMMUNITY): Payer: Self-pay

## 2013-11-27 ENCOUNTER — Encounter (HOSPITAL_COMMUNITY): Payer: Self-pay | Admitting: *Deleted

## 2013-11-29 ENCOUNTER — Encounter: Payer: Self-pay | Admitting: Obstetrics & Gynecology

## 2013-11-30 ENCOUNTER — Ambulatory Visit (HOSPITAL_COMMUNITY): Payer: BC Managed Care – PPO | Admitting: Anesthesiology

## 2013-11-30 ENCOUNTER — Other Ambulatory Visit: Payer: Self-pay

## 2013-11-30 ENCOUNTER — Encounter (HOSPITAL_COMMUNITY)
Admission: RE | Disposition: A | Discharge status: Home / Self Care | Payer: Self-pay | Source: Ambulatory Visit | Attending: Obstetrics & Gynecology

## 2013-11-30 ENCOUNTER — Encounter (HOSPITAL_COMMUNITY): Payer: Self-pay | Admitting: Certified Registered"

## 2013-11-30 ENCOUNTER — Encounter (HOSPITAL_COMMUNITY): Payer: BC Managed Care – PPO | Admitting: Anesthesiology

## 2013-11-30 ENCOUNTER — Ambulatory Visit (HOSPITAL_COMMUNITY)
Admission: RE | Admit: 2013-11-30 | Discharge: 2013-11-30 | Disposition: A | Discharge status: Home / Self Care | Payer: BC Managed Care – PPO | Source: Ambulatory Visit | Attending: Obstetrics & Gynecology | Admitting: Obstetrics & Gynecology

## 2013-11-30 DIAGNOSIS — O021 Missed abortion: Secondary | ICD-10-CM | POA: Insufficient documentation

## 2013-11-30 DIAGNOSIS — K219 Gastro-esophageal reflux disease without esophagitis: Secondary | ICD-10-CM | POA: Diagnosis not present

## 2013-11-30 DIAGNOSIS — F411 Generalized anxiety disorder: Secondary | ICD-10-CM | POA: Diagnosis not present

## 2013-11-30 HISTORY — PX: DILATION AND EVACUATION: SHX1459

## 2013-11-30 SURGERY — DILATION AND EVACUATION, UTERUS
Anesthesia: Monitor Anesthesia Care | Site: Vagina

## 2013-11-30 MED ORDER — FAMOTIDINE 20 MG PO TABS
20.0000 mg | ORAL_TABLET | Freq: Once | ORAL | Status: AC
  Administered 2013-11-30: 20 mg via ORAL

## 2013-11-30 MED ORDER — ACETAMINOPHEN 160 MG/5ML PO SOLN
ORAL | Status: AC
  Administered 2013-11-30: 950 mg via ORAL
  Filled 2013-11-30: qty 40.6

## 2013-11-30 MED ORDER — DEXAMETHASONE SODIUM PHOSPHATE 10 MG/ML IJ SOLN
INTRAMUSCULAR | Status: AC
  Filled 2013-11-30: qty 1

## 2013-11-30 MED ORDER — MIDAZOLAM HCL 2 MG/2ML IJ SOLN
INTRAMUSCULAR | Status: DC | PRN
  Administered 2013-11-30: 2 mg via INTRAVENOUS

## 2013-11-30 MED ORDER — PROPOFOL 10 MG/ML IV EMUL
INTRAVENOUS | Status: AC
  Filled 2013-11-30: qty 20

## 2013-11-30 MED ORDER — ONDANSETRON HCL 4 MG/2ML IJ SOLN
INTRAMUSCULAR | Status: AC
  Filled 2013-11-30: qty 2

## 2013-11-30 MED ORDER — ACETAMINOPHEN 160 MG/5ML PO SOLN: 960.0000 mg | Freq: Four times a day (QID) | ORAL | Status: DC | PRN

## 2013-11-30 MED ORDER — LIDOCAINE HCL (CARDIAC) 20 MG/ML IV SOLN
INTRAVENOUS | Status: DC | PRN
  Administered 2013-11-30: 40 mg via INTRAVENOUS

## 2013-11-30 MED ORDER — LIDOCAINE HCL (CARDIAC) 20 MG/ML IV SOLN
INTRAVENOUS | Status: AC
  Filled 2013-11-30: qty 5

## 2013-11-30 MED ORDER — LIDOCAINE-EPINEPHRINE 1 %-1:100000 IJ SOLN
INTRAMUSCULAR | Status: DC | PRN
  Administered 2013-11-30: 10 mL

## 2013-11-30 MED ORDER — SCOPOLAMINE 1 MG/3DAYS TD PT72
MEDICATED_PATCH | TRANSDERMAL | Status: AC
  Administered 2013-11-30: 1.5 mg via TRANSDERMAL
  Filled 2013-11-30: qty 1

## 2013-11-30 MED ORDER — ACETAMINOPHEN 160 MG/5ML PO SOLN
950.0000 mg | Freq: Once | ORAL | Status: AC
  Administered 2013-11-30: 950 mg via ORAL

## 2013-11-30 MED ORDER — LACTATED RINGERS IV SOLN
INTRAVENOUS | Status: DC
  Administered 2013-11-30 (×2): via INTRAVENOUS

## 2013-11-30 MED ORDER — CEFAZOLIN SODIUM-DEXTROSE 2-3 GM-% IV SOLR: 2.0000 g | INTRAVENOUS | Status: DC

## 2013-11-30 MED ORDER — HYDROCODONE-ACETAMINOPHEN 5-300 MG PO TABS: 1.0000 | ORAL_TABLET | Freq: Four times a day (QID) | ORAL | Status: DC

## 2013-11-30 MED ORDER — DEXAMETHASONE SODIUM PHOSPHATE 10 MG/ML IJ SOLN
INTRAMUSCULAR | Status: DC | PRN
  Administered 2013-11-30: 4 mg via INTRAVENOUS

## 2013-11-30 MED ORDER — MIDAZOLAM HCL 2 MG/2ML IJ SOLN
INTRAMUSCULAR | Status: AC
  Filled 2013-11-30: qty 2

## 2013-11-30 MED ORDER — KETOROLAC TROMETHAMINE 30 MG/ML IJ SOLN
INTRAMUSCULAR | Status: AC
  Filled 2013-11-30: qty 1

## 2013-11-30 MED ORDER — KETOROLAC TROMETHAMINE 30 MG/ML IJ SOLN
INTRAMUSCULAR | Status: DC | PRN
  Administered 2013-11-30: 30 mg via INTRAVENOUS
  Administered 2013-11-30: 30 mg via INTRAMUSCULAR

## 2013-11-30 MED ORDER — FAMOTIDINE 20 MG PO TABS
ORAL_TABLET | ORAL | Status: AC
  Filled 2013-11-30: qty 1

## 2013-11-30 MED ORDER — ONDANSETRON HCL 4 MG/2ML IJ SOLN
INTRAMUSCULAR | Status: DC | PRN
  Administered 2013-11-30: 4 mg via INTRAVENOUS

## 2013-11-30 MED ORDER — PROPOFOL 10 MG/ML IV EMUL
INTRAVENOUS | Status: DC | PRN
Start: 2013-11-30 — End: 2013-11-30
  Administered 2013-11-30: 40 mg via INTRAVENOUS
  Administered 2013-11-30: 80 mg via INTRAVENOUS
  Administered 2013-11-30: 30 mg via INTRAVENOUS
  Administered 2013-11-30: 50 mg via INTRAVENOUS

## 2013-11-30 MED ORDER — LIDOCAINE-EPINEPHRINE 1 %-1:100000 IJ SOLN
INTRAMUSCULAR | Status: AC
  Filled 2013-11-30: qty 1

## 2013-11-30 MED ORDER — FENTANYL CITRATE 0.05 MG/ML IJ SOLN
INTRAMUSCULAR | Status: AC
  Filled 2013-11-30: qty 2

## 2013-11-30 MED ORDER — CEFAZOLIN SODIUM-DEXTROSE 2-3 GM-% IV SOLR
INTRAVENOUS | Status: AC
  Administered 2013-11-30: 2 g via INTRAVENOUS
  Filled 2013-11-30: qty 50

## 2013-11-30 MED ORDER — SCOPOLAMINE 1 MG/3DAYS TD PT72
1.0000 | MEDICATED_PATCH | Freq: Once | TRANSDERMAL | Status: DC
  Administered 2013-11-30: 1.5 mg via TRANSDERMAL

## 2013-11-30 MED ORDER — FENTANYL CITRATE 0.05 MG/ML IJ SOLN
INTRAMUSCULAR | Status: DC | PRN
  Administered 2013-11-30 (×2): 50 ug via INTRAVENOUS

## 2013-11-30 SURGICAL SUPPLY — 15 items
CATH ROBINSON RED A/P 16FR (CATHETERS) ×2 IMPLANT
CLOTH BEACON ORANGE TIMEOUT ST (SAFETY) ×2 IMPLANT
DECANTER SPIKE VIAL GLASS SM (MISCELLANEOUS) ×2 IMPLANT
GLOVE BIOGEL PI IND STRL 7.0 (GLOVE) ×1 IMPLANT
GLOVE BIOGEL PI INDICATOR 7.0 (GLOVE) ×1
GLOVE ECLIPSE 6.5 STRL STRAW (GLOVE) ×4 IMPLANT
GOWN STRL REUS W/TWL LRG LVL3 (GOWN DISPOSABLE) ×4 IMPLANT
KIT BERKELEY 1ST TRIMESTER 3/8 (MISCELLANEOUS) ×2 IMPLANT
NS IRRIG 1000ML POUR BTL (IV SOLUTION) ×2 IMPLANT
PACK VAGINAL MINOR WOMEN LF (CUSTOM PROCEDURE TRAY) ×2 IMPLANT
PAD OB MATERNITY 4.3X12.25 (PERSONAL CARE ITEMS) ×2 IMPLANT
PAD PREP 24X48 CUFFED NSTRL (MISCELLANEOUS) ×2 IMPLANT
SET BERKELEY SUCTION TUBING (SUCTIONS) ×2 IMPLANT
TOWEL OR 17X24 6PK STRL BLUE (TOWEL DISPOSABLE) ×4 IMPLANT
VACURETTE 7MM CVD STRL WRAP (CANNULA) ×1 IMPLANT

## 2013-11-30 NOTE — Telephone Encounter (Signed)
[facility] called re: patient and her vicodin RX-states the order has not been signed and they will see if patient is able to come by and pick it up at the office. Reprinted RX and will leave at the front for patient//kn

## 2013-11-30 NOTE — Anesthesia Postprocedure Evaluation (Signed)
  Anesthesia Post-op Note  Patient: Nichole Davis  Procedure(s) Performed: Procedure(s): DILATATION AND EVACUATION (N/A) Patient is awake and responsive. Pain and nausea are reasonably well controlled. Vital signs are stable and clinically acceptable. Oxygen saturation is clinically acceptable. There are no apparent anesthetic complications at this time. Patient is ready for discharge.

## 2013-11-30 NOTE — Op Note (Signed)
11/30/2013  12:04 PM  PATIENT:  Nichole Davis  28 y.o. female  PRE-OPERATIVE DIAGNOSIS:  Anembryonic pregnancy, missed AB  POST-OPERATIVE DIAGNOSIS:  missed abortion  PROCEDURE:  Procedure(s): DILATATION AND EVACUATION  SURGEON:  Devlynn Knoff SUZANNE  ASSISTANTS: OR staff   ANESTHESIA:   MAC  ESTIMATED BLOOD LOSS: 50cc  BLOOD ADMINISTERED:none   FLUIDS: 500cc LR  UOP: 50cc  SPECIMEN:  Products of conception  DISPOSITION OF SPECIMEN:  PATHOLOGY  FINDINGS: full uterus 8-10 weeks in size  DESCRIPTION OF OPERATION: Patient was taken to the operating room.  She is placed in the supine position. SCDs were on her lower extremities and functioning properly. General anesthesia with an LMA was administered without difficulty. Dr. Lyndle Herrlich oversaw case.  Legs were then placed in the [place] in the low lithotomy position. The legs were lifted to the high lithotomy position and the Betadine prep was used on the inner thighs perineum and vagina x3. Patient was draped in a normal standard fashion. An in and out catheterization with a red rubber Foley catheter was performed. Approximately 50 cc of clear urine was noted. A bivalve speculum was placed the vagina. The anterior lip of the cervix was grasped with single-tooth tenaculum.  A paracervical block of 1% lidocaine mixed one-to-one with epinephrine (1:100,000 units).  10 cc was used total. The cervix is dilated up to #[facility] dilators. The endometrial cavity sounded to 8 cm.  A #7 curved suction tip was passed through the cervical os and passed to the fundus.  Suction was applied.  Turning the suction device in a clock-wise fashion, the cavity was fully suctioned with several passes.  Once no further tissue was apparent, the tip was removed.  Using a #1 smooth curette, the cavity was curetted until a rough gritty texture was noted in all quadrants.  The suction tip was placed in the endometrial cavity one additional time and  suction applied.  Minimal blood noted.  Suction stopped.  Tip removed.  Tenaculum removed.  Minimal bleeding noted.  The speculum was removed from the vagina. The prep was cleansed of the patient's skin. The legs are positioned back in the supine position. Sponge, lap, needle, initially counts were correct x2. Patient was taken to recovery in stable condition.  COUNTS:  YES  PLAN OF CARE: Transfer to PACU

## 2013-11-30 NOTE — Discharge Instructions (Signed)
Post-surgical Instructions, Outpatient Surgery  You may expect to feel dizzy, weak, and drowsy for as long as 24 hours after receiving the medicine that made you sleep (anesthetic). For the first 24 hours after your surgery:    Do not drive a car, ride a bicycle, participate in physical activities, or take public transportation until you are done taking narcotic pain medicines or as directed by Dr. Sabra Heck.   Do not drink alcohol or take tranquilizers.   Do not take medicine that has not been prescribed by your physicians.   Do not sign important papers or make important decisions while on narcotic pain medicines.   Have a responsible person with you.   PAIN MANAGEMENT  Motrin 800mg .  (This is the same as 4-200mg  over the counter tablets of Motrin or ibuprofen.)  You may take this every eight hours or as needed for cramping. (May take Ibuprofen after 6PM this evening)    Vicodin 5/300mg .  For more severe pain, take one or two tablets every four to six hours as needed for pain control.  (Remember that narcotic pain medications increase your risk of constipation.  If this becomes a problem, you may take an over the counter stool softener like Colace 100mg  up to four times a day.)  PLEASE DO NOT USE THIS MEDICATION IF YOU DON'T HAVE TO AS YOU ARE GOING TO HAVE THE COLONOSCOPY THIS WEEK.  DO'S AND DON'T'S  Do not take a tub bath for one week.  You may shower on the first day after your surgery  Do not do any heavy lifting for one to two weeks.  This increases the chance of bleeding.  Do move around as you feel able.  Stairs are fine.  You may begin to exercise again as you feel able.  Do not lift any weights for two weeks.  Do not put anything in the vagina for two weeks--no tampons, intercourse, or douching.    DO NOT HAVE INTERCOURSE.    REGULAR MEDIATIONS/VITAMINS:  You may restart all of your regular medications as prescribed.  You may restart all of your vitamins as you normally  take them.    PLEASE CALL OR SEEK MEDICAL CARE IF:  You have persistent nausea and vomiting.   You have trouble eating or drinking.   You have an oral temperature above 100.5.   You have constipation that is not helped by adjusting diet or increasing fluid intake. Pain medicines are a common cause of constipation.   You have heavy vaginal bleeding    DISCHARGE INSTRUCTIONS: D&C / D&E The following instructions have been prepared to help you care for yourself upon your return home.   Personal hygiene:  Use sanitary pads for vaginal drainage, not tampons.  Shower the day after your procedure.  NO tub baths, pools or Jacuzzis for 2-3 weeks.  Wipe front to back after using the bathroom.  Activity and limitations:  Do NOT drive or operate any equipment for 24 hours. The effects of anesthesia are still present and drowsiness may result.  Do NOT rest in bed all day.  Walking is encouraged.  Walk up and down stairs slowly.  You may resume your normal activity in one to two days or as indicated by your physician.  Sexual activity: NO intercourse for at least 2 weeks after the procedure, or as indicated by your physician.  Diet: Eat a light meal as desired this evening. You may resume your usual diet tomorrow.  Return to work:  You may resume your work activities in one to two days or as indicated by your doctor.  What to expect after your surgery: Expect to have vaginal bleeding/discharge for 2-3 days and spotting for up to 10 days. It is not unusual to have soreness for up to 1-2 weeks. You may have a slight burning sensation when you urinate for the first day. Mild cramps may continue for a couple of days. You may have a regular period in 2-6 weeks.  Call your doctor for any of the following:  Excessive vaginal bleeding, saturating and changing one pad every hour.  Inability to urinate 6 hours after discharge from hospital.  Pain not relieved by pain medication.  Fever  of 100.4 F or greater.  Unusual vaginal discharge or odor.   Call for an appointment:    Patients signature: ______________________  Nurses signature ________________________  Support person's signature_______________________

## 2013-11-30 NOTE — Anesthesia Preprocedure Evaluation (Signed)
Anesthesia Evaluation  Patient identified by MRN, date of birth, ID band Patient awake    Reviewed: Allergy & Precautions, H&P , Patient's Chart, lab work & pertinent test results, reviewed documented beta blocker date and time   Airway Mallampati: II TM Distance: >3 FB Neck ROM: full    Dental no notable dental hx.    Pulmonary  breath sounds clear to auscultation  Pulmonary exam normal       Cardiovascular Rhythm:regular Rate:Normal     Neuro/Psych    GI/Hepatic GERD-  ,  Endo/Other    Renal/GU      Musculoskeletal   Abdominal   Peds  Hematology   Anesthesia Other Findings   Reproductive/Obstetrics                           Anesthesia Physical Anesthesia Plan  ASA: II  Anesthesia Plan: MAC   Post-op Pain Management:    Induction: Intravenous  Airway Management Planned: LMA, Mask and Natural Airway  Additional Equipment:   Intra-op Plan:   Post-operative Plan:   Informed Consent: I have reviewed the patients History and Physical, chart, labs and discussed the procedure including the risks, benefits and alternatives for the proposed anesthesia with the patient or authorized representative who has indicated his/her understanding and acceptance.   Dental Advisory Given  Plan Discussed with: CRNA and Surgeon  Anesthesia Plan Comments: (Discussed sedation and potential to need to place airway or ETT if warranted by clinical changes intra-operatively. We will start procedure as MAC.)        Anesthesia Quick Evaluation

## 2013-11-30 NOTE — Transfer of Care (Signed)
Immediate Anesthesia Transfer of Care Note  Patient: Nichole Davis  Procedure(s) Performed: Procedure(s): DILATATION AND EVACUATION (N/A)  Patient Location: PACU  Anesthesia Type:MAC  Level of Consciousness: awake, alert  and oriented  Airway & Oxygen Therapy: Patient Spontanous Breathing and Patient connected to nasal cannula oxygen  Post-op Assessment: Report given to PACU RN, Post -op Vital signs reviewed and stable and Patient moving all extremities  Post vital signs: Reviewed and stable  Complications: No apparent anesthesia complications

## 2013-11-30 NOTE — H&P (Signed)
Nichole Davis is an 28 y.o. femaleG3P0A2 SF here for suction D&C due to anembryonic pregnancy.  Pt is 9 3/7 weeks by LMP and first ultrasound showed size< dates.  Repeat ultrasound two weeks later showed no evidence of embryo and only four days change in size of sac.  Pt has known bowel tumor.  She has colonoscopy planned lateral this week.  Due to evdience on ultrasound of no embryo and need to proceed with additional evlauation/surgery for bowel tumor, offered to proceed with suction D&C now and not wait any further. I feel very comfortable that we are not interveeing too early.  Pt counseled about risks and benefits in office on Friday, 11/27/13.  Pt here and ready to proceed.  Pertinent Gynecological History: Menses: amenorrhea Bleeding: none Contraception: none DES exposure: denies Blood transfusions: none Sexually transmitted diseases: no past history Previous GYN Procedures: none  Last mammogram: n/a Date: n/a Last pap: normal Date: 6/15 OB History: G2, P0   Menstrual History: Patient's last menstrual period was 09/24/2013.    Past Medical History  Diagnosis Date  . Anxiety   . GERD (gastroesophageal reflux disease)   . Chest pain     pt. seen at [facility] 07/12/2011, for chest pain, cleared medically, given  Lorazepam & prilosec.  Took Lorazepam & had good relief fr. it, has not taken Prilosec  . PONV (postoperative nausea and vomiting)   . Anemia   . 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 STD (sexually transmitted disease)     trichomonas 8/09, chlamydia 2014  . Vaginal discharge 07/01/2012  . 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 Lymph node biopsy Left 2010    left side neck  . Bone marrow harvest  2005  . Retinal detachment repair w/ scleral buckle le Right 07/17/11    eye  . Scleral buckle  07/17/2011   Procedure: SCLERAL BUCKLE;  Surgeon: Hayden Pedro, MD;  Location: [place];  Service: Ophthalmology;  Laterality: Right;  . Retinal tear repair cryotherapy Left   . Implanon removal Left     removed 2011, arm    Family History  Problem Relation Age of Onset  . Anesthesia problems Neg Hx   . Leukemia Brother     Social History:  reports that she has never smoked. She has never used smokeless tobacco. She reports that she drinks alcohol. She reports that she does not use illicit drugs.  Allergies: No Known Allergies  No prescriptions prior to admission    Review of Systems  All other systems reviewed and are negative.   Blood pressure 110/67, pulse 69, temperature 98.4 F (36.9 C), temperature source Oral, resp. rate 20, last menstrual period 09/24/2013, SpO2 100.00%. Physical Exam  Constitutional: She is oriented to person, place, and time. She appears well-developed and well-nourished.  Cardiovascular: Normal rate and regular rhythm.   Respiratory: Effort normal and breath sounds normal.  Neurological: She is alert and oriented to person, place, and time.  Skin: Skin is warm.  Psychiatric: She has a normal mood and affect.    No results found for this or any previous visit (from the past 24 hour(s)).  No results found.  Assessment/Plan: 28 yo [place] Single female with anembryonic pregnancy here for suction D&E.  All questions answered.  Hale Bogus SUZANNE 11/30/2013, 11:19 AM

## 2013-12-01 ENCOUNTER — Telehealth: Payer: Self-pay

## 2013-12-01 ENCOUNTER — Encounter (HOSPITAL_COMMUNITY): Payer: Self-pay | Admitting: Obstetrics & Gynecology

## 2013-12-01 NOTE — Telephone Encounter (Signed)
Message copied by Algernon Huxley on Tue Dec 01, 2013  2:51 PM ------      Message from: HUNT, Virginia R      Created: Mon Nov 30, 2013  8:54 AM      Regarding: FW: follow up on mutual patient       Call in am      ----- Message -----         From: Irene Shipper, MD         Sent: 11/30/2013   8:17 AM           To: Lyman Speller, MD, Maury Dus, RN      Subject: RE: follow up on mutual patient                          Alto [place] you. I will have my nurse, Vaughan Basta, contact the patient tomorrow morning to see how she is feeling. If she is up for it, we will proceed with colonoscopy as planned. Thank you very much.      John      ----- Message -----         From: Lyman Speller, MD         Sent: 11/29/2013   4:38 PM           To: Irene Shipper, MD      Subject: follow up on mutual patient                              Dr. Henrene Pastor,      [name] does not have a normal pregnancy.  It appears as though no embryo is growing.  There was only a four day change in ultrasound findings over a 14 day period.  She is going to have a D&E in the OR on Monday, 9/14.              If it is ok with you and if she does fine with the procedure, I will have her proceed with her colonoscopy prep on 9/15 and then you have the colonoscopy planned 9/16.              If you'd like for her to wait another few days as she will get anesthesia for the D&E, please let me know and I will inform her before her procedure.  If I don't get a message back from you, I will have someone from my office call just to be sure you are comfortable with this.  Thanks.            Edwinna Areola             ------

## 2013-12-01 NOTE — Telephone Encounter (Signed)
Spoke with pt and she states she is feeling ok and plans to proceed with colon tomorrow. Pt has the prep.

## 2013-12-02 ENCOUNTER — Encounter: Payer: Self-pay | Admitting: Internal Medicine

## 2013-12-02 ENCOUNTER — Ambulatory Visit (AMBULATORY_SURGERY_CENTER): Payer: BC Managed Care – PPO | Admitting: Internal Medicine

## 2013-12-02 VITALS — BP 120/80 | HR 82 | Temp 99.3°F | Resp 15 | Ht 60.75 in | Wt 137.0 lb

## 2013-12-02 DIAGNOSIS — D126 Benign neoplasm of colon, unspecified: Secondary | ICD-10-CM

## 2013-12-02 DIAGNOSIS — R933 Abnormal findings on diagnostic imaging of other parts of digestive tract: Secondary | ICD-10-CM

## 2013-12-02 DIAGNOSIS — C187 Malignant neoplasm of sigmoid colon: Secondary | ICD-10-CM

## 2013-12-02 MED ORDER — SODIUM CHLORIDE 0.9 % IV SOLN: 500.0000 mL | INTRAVENOUS | Status: DC

## 2013-12-02 NOTE — Progress Notes (Signed)
Procedure ends, to recovery, report given and VSS. 

## 2013-12-02 NOTE — Op Note (Signed)
[place]  [address]. [place], [address]   COLONOSCOPY PROCEDURE REPORT  PATIENT: Nichole Davis, Nichole Davis  MR#: [id number] BIRTHDATE: 1985-12-13 , 28  yrs. old GENDER: Female ENDOSCOPIST: Eustace Quail, MD REFERRED LH:TDSKA Charlies Constable, M.D. PROCEDURE DATE:  12/02/2013 PROCEDURE:   Colonoscopy with biopsy and Submucosal injection, any substance First Screening Colonoscopy - Avg.  risk and is 50 yrs.  old or older - No.  Prior Negative Screening - Now for repeat screening. N/A  History of Adenoma - Now for follow-up colonoscopy & has been > or = to 3 yrs.  N/A  Polyps Removed Today? Yes. ASA CLASS:   Class I INDICATIONS:an abnormal CT. MEDICATIONS: MAC sedation, administered by CRNA and propofol (Diprivan) 650mg  IV  DESCRIPTION OF PROCEDURE:   After the risks benefits and alternatives of the procedure were thoroughly explained, informed consent was obtained.  A digital rectal exam revealed no abnormalities of the rectum.   The LB [id number]  endoscope was introduced through the anus and advanced to the cecum, which was identified by both the appendix and ileocecal valve. No adverse events experienced.   The quality of the prep was excellent, using MoviPrep  The instrument was then slowly withdrawn as the colon was fully examined.  COLON FINDINGS: The colonoscope was advanced to the cecal tip.  The cecum, right colon, transverse colon, and descending colon were normal.  There was a 5 cm semipedunculated soft polypoid lesion in the sigmoid colon at approximately 25 cm from the anal verge.  This had a somewhat atypical appearance.  Multiple biopsies taken. Submucosal injection of ink marking tattoo placed just distal (toward the rectum) to the lesion.  The remainder of the exam was normal.  Retroflexed views revealed no abnormalities. The time to cecum=3 minutes 55 seconds.  Withdrawal time=20 minutes 45 seconds. The scope was withdrawn and the procedure  completed. COMPLICATIONS: There were no complications.  ENDOSCOPIC IMPRESSION: 1. Atypical large polypoid mass in the sigmoid colon as described  RECOMMENDATIONS: 1. Await biopsy results 2. Followup with Dr. Johney Maine, of general surgery   eSigned:  Eustace Quail, MD 12/02/2013 3:47 PM   cc: Elveria Rising, MD, Edwinna Areola, MD, Michael Boston, MD, and The Patient

## 2013-12-02 NOTE — Progress Notes (Signed)
Called to room to assist during endoscopic procedure.  Patient ID and intended procedure confirmed with present staff. Received instructions for my participation in the procedure from the performing physician.  

## 2013-12-02 NOTE — Progress Notes (Signed)
Nichole Aland, RN relayed message to Holland Falling, CRNA for me.  The pt drank sips of water while in the waiting room.  She said total amount half of bottle of water.  Last sip at 13:40.   Also the pt had a d and c this past Monday 11-30-2013.  The pt said Dr. Edwinna Areola would speak with Dr. Henrene Pastor.  maw

## 2013-12-02 NOTE — Patient Instructions (Signed)
YOU HAD AN ENDOSCOPIC PROCEDURE TODAY AT THE [facility]: Refer to the procedure report that was given to you for any specific questions about what was found during the examination.  If the procedure report does not answer your questions, please call your gastroenterologist to clarify.  If you requested that your care partner not be given the details of your procedure findings, then the procedure report has been included in a sealed envelope for you to review at your convenience later.  YOU SHOULD EXPECT: Some feelings of bloating in the abdomen. Passage of more gas than usual.  Walking can help get rid of the air that was put into your GI tract during the procedure and reduce the bloating. If you had a lower endoscopy (such as a colonoscopy or flexible sigmoidoscopy) you may notice spotting of blood in your stool or on the toilet paper. If you underwent a bowel prep for your procedure, then you may not have a normal bowel movement for a few days.  DIET: Your first meal following the procedure should be a light meal and then it is ok to progress to your normal diet.  A half-sandwich or bowl of soup is an example of a good first meal.  Heavy or fried foods are harder to digest and may make you feel nauseous or bloated.  Likewise meals heavy in dairy and vegetables can cause extra gas to form and this can also increase the bloating.  Drink plenty of fluids but you should avoid alcoholic beverages for 24 hours.  ACTIVITY: Your care partner should take you home directly after the procedure.  You should plan to take it easy, moving slowly for the rest of the day.  You can resume normal activity the day after the procedure however you should NOT DRIVE or use heavy machinery for 24 hours (because of the sedation medicines used during the test).    SYMPTOMS TO REPORT IMMEDIATELY: A gastroenterologist can be reached at any hour.  During normal business hours, 8:30 AM to 5:00 PM Monday through Friday,  call [phone].  After hours and on weekends, please call the GI answering service at [phone] who will take a message and have the physician on call contact you.   Following lower endoscopy (colonoscopy or flexible sigmoidoscopy):  Excessive amounts of blood in the stool  Significant tenderness or worsening of abdominal pains  Swelling of the abdomen that is new, acute  Fever of 100F or higher    FOLLOW UP: If any biopsies were taken you will be contacted by phone or by letter within the next 1-3 weeks.  Call your gastroenterologist if you have not heard about the biopsies in 3 weeks.  Our staff will call the home number listed on your records the next business day following your procedure to check on you and address any questions or concerns that you may have at that time regarding the information given to you following your procedure. This is a courtesy call and so if there is no answer at the home number and we have not heard from you through the emergency physician on call, we will assume that you have returned to your regular daily activities without incident.  SIGNATURES/CONFIDENTIALITY: You and/or your care partner have signed paperwork which will be entered into your electronic medical record.  These signatures attest to the fact that that the information above on your After Visit Summary has been reviewed and is understood.  Full responsibility of the confidentiality   of this discharge information lies with you and/or your care-partner.    Await biopsy results  Follow up with Dr Johney Maine, of General Surgery

## 2013-12-03 ENCOUNTER — Telehealth: Payer: Self-pay | Admitting: Obstetrics & Gynecology

## 2013-12-03 ENCOUNTER — Other Ambulatory Visit (INDEPENDENT_AMBULATORY_CARE_PROVIDER_SITE_OTHER): Payer: Self-pay | Admitting: Surgery

## 2013-12-03 ENCOUNTER — Telehealth: Payer: Self-pay | Admitting: *Deleted

## 2013-12-03 ENCOUNTER — Encounter: Payer: Self-pay | Admitting: Emergency Medicine

## 2013-12-03 DIAGNOSIS — R1900 Intra-abdominal and pelvic swelling, mass and lump, unspecified site: Secondary | ICD-10-CM

## 2013-12-03 DIAGNOSIS — K6389 Other specified diseases of intestine: Secondary | ICD-10-CM

## 2013-12-03 MED ORDER — METRONIDAZOLE 500 MG PO TABS: 500.0000 mg | ORAL_TABLET | ORAL | Status: DC

## 2013-12-03 MED ORDER — HYDROCODONE-ACETAMINOPHEN 5-325 MG PO TABS: 1.0000 | ORAL_TABLET | Freq: Four times a day (QID) | ORAL | Status: DC | PRN

## 2013-12-03 MED ORDER — IBUPROFEN 800 MG PO TABS: 800.0000 mg | ORAL_TABLET | Freq: Three times a day (TID) | ORAL | Status: DC | PRN

## 2013-12-03 MED ORDER — NEOMYCIN SULFATE 500 MG PO TABS: 1000.0000 mg | ORAL_TABLET | ORAL | Status: DC

## 2013-12-03 NOTE — Telephone Encounter (Signed)
Returned call to patient. No answer on Mobile.   Discussed with Dr. Sabra Heck.  Patient to take Motrin 800 mg q 6-8 hours. Call in morning with update.  Signed letter from Dr. 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 Quincy Simmonds for 9/18

## 2013-12-03 NOTE — H&P (Signed)
Nichole Davis  06/09/1985 [id number]  Patient Care Team: No Pcp Per Patient as PCP - General (General Practice) Michael Boston, MD as Consulting Physician (General Surgery) Irene Shipper, MD as Consulting Physician (Gastroenterology) Lyman Speller, MD as Consulting Physician (Gynecology) Juanita Craver, MD as Consulting Physician (Gastroenterology)  This patient is a 28 y.o.female for surgical evaluation.    Reason for visit: Intraperitoneal abdominal mass. Initially thought to be pelvic. Now probably sigmoid colon   Pleasant young female. Vague abdominal pain. Felt to be lower. Had ultrasound suspicious for pelvic mass. MRI noted 5 cm pelvic mass. Possibly associated with bowel vs. Ovary. I recommended with radiology for CT enterrhography. Study season mass but now flipped up in left midabdomen. Seems more associated with sigmoid colon. Surgical consultation requested. I recommended colonoscopy.   Patient otherwise rather healthy and active. Normally has a bowel movement every day. However now the stools are a little more watery. She has seen blood in the stool. Abdominal discomfort and bloating with eating. That has been a change in the past few months. Normally walks 30-60 minutes without difficulty. Rarely drinks alcohol. No abdominal surgery. No personal nor family history of GI/colon cancer, inflammatory bowel disease, irritable bowel syndrome, allergy such as Celiac Sprue, dietary/dairy problems, colitis, ulcers nor gastritis. No recent sick contacts/gastroenteritis. No travel outside the country. No changes in diet. No dysphagia to solids or liquids. No significant heartburn or reflux. No hematochezia, hematemesis, coffee ground emesis. No evidence of prior gastric/peptic ulceration.  Patient recently pregnant.  Found to have anembryonic pregnancy.  Nonviable.  D&C performed.  Colonoscopy recently done.  Mass in sigmoid colon margin frond-like.  Suspicious.  At this point, I think I would  continue the original plan & plan segmental colonic resection.  Reasonable for minimally invasive robotic approach.  I discussed with the past.  We will discuss again.  We will get the scheduling process going.  Patient Active Problem List   Diagnosis Date Noted  . Pregnancy, +UPT 10/27/2013   11/04/2013  . Hematochezia 11/04/2013  . Change in bowel habits 11/04/2013  . Abdominal mass ?sigmoid colon 08/21/2013  . Exposure to STD 07/01/2012    Past Medical History  Diagnosis Date  . Anxiety   . GERD (gastroesophageal reflux disease)   . Chest pain     pt. seen at [facility] 07/12/2011, for chest pain, cleared medically, given  Lorazepam & prilosec.  Took Lorazepam & had good relief fr. it, has not taken Prilosec  . PONV (postoperative nausea and vomiting)   . Anemia   . 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 STD (sexually transmitted disease)     trichomonas 8/09, chlamydia 2014  . Vaginal discharge 07/01/2012  . 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 Lymph node biopsy Left 2010    left side neck  . Bone marrow harvest  2005  . Retinal detachment repair w/ scleral buckle le Right 07/17/11    eye  . Scleral buckle  07/17/2011    Procedure: SCLERAL BUCKLE;  Surgeon: Hayden Pedro, MD;  Location: [place];  Service: Ophthalmology;  Laterality: Right;  . Retinal tear repair cryotherapy Left   . Implanon removal Left     removed 2011, arm  . Dilation and evacuation N/A 11/30/2013  Procedure: DILATATION AND EVACUATION;  Surgeon: Lyman Speller, MD;  Location: [facility];  Service: Gynecology;  Laterality: N/A;    History   Social History  . Marital Status: Single    Spouse Name: N/A    Number of Children: 0  . Years of Education: N/A   Occupational History  . customer service    Social History Main Topics  . Smoking  status: Never Smoker   . Smokeless tobacco: Never Used  . Alcohol Use: Yes     Comment: socially  . Drug Use: No  . Sexual Activity: Yes    Partners: Male    Birth Control/ Protection: Condom   Other Topics Concern  . Not on file   Social History Narrative  . No narrative on file    Family History  Problem Relation Age of Onset  . Anesthesia problems Neg Hx   . Colon cancer Neg Hx   . Esophageal cancer Neg Hx   . Pancreatic cancer Neg Hx   . Rectal cancer Neg Hx   . Stomach cancer Neg Hx   . Leukemia Brother     Current Outpatient Prescriptions  Medication Sig Dispense Refill  . Hydrocodone-Acetaminophen (VICODIN) 5-300 MG TABS Take 1 tablet by mouth every 6 (six) hours. May take two tablets if needed  15 each  0   No current facility-administered medications for this visit.   Facility-Administered Medications Ordered in Other Visits  Medication Dose Route Frequency Provider Last Rate Last Dose  . 0.9 %  sodium chloride infusion  500 mL Intravenous Continuous Irene Shipper, MD         No Known Allergies  LMP 09/24/2013  US Ob Transvaginal  11/26/2013   SEE PROGRESS NOTE  US Ob Transvaginal  11/12/2013   SEE PROGRESS NOTE   Note: This dictation was prepared with Dragon/digital dictation along with [employer]. Any transcriptional errors that result from this process are unintentional.

## 2013-12-03 NOTE — Telephone Encounter (Signed)
  Follow up Call-  Call back number 12/02/2013  Post procedure Call Back phone  # [phone] cell  Permission to leave phone message Yes     Received message that caller is unable to take calls at this time.  Unable to leave message.

## 2013-12-03 NOTE — Telephone Encounter (Signed)
1. Patient requesting a note excusing her from work 9/14-9/17/15 due to ongoing gyn care she has had this week.  2. Patient is requesting pain medicine. She says the nurse at the hospital said the RX was not signed so she did not get any pain medicine but feels she needs it now. Patient said she thought it was supposed to be Vicodin but was not sure. I asked if she called the pharmacy to see if they had one on file already (I see the RX listed in her medications.) but she did not contact the pharmacy. Please advise patient.

## 2013-12-03 NOTE — Telephone Encounter (Addendum)
Dr. Sabra Heck,  Patient states she did not have any pain on Monday or Tuesday after procedure. Yesterday, had colonoscopy. Today, having increased pelvic cramps, location patient describes as lower abdomen and in the middle.Marland Kitchen Has not tried any otc medication for treatment. States she did not get vicodin rx when in the hospital on Monday. Requesting to have that filled. Patient denies any vaginal bleeding, spotting only that started on Tuesday.   Patient requesting note to return to work tomorrow, patient feels as if she will be able to go.   Advised would work on both rx and letter and return her call.  If Dr. Sabra Heck agreeable to rx, patient would like medication to be sent to [facility] on [address].

## 2013-12-03 NOTE — Telephone Encounter (Signed)
Patient came into office. She is driving herself.  She missed my call on Mobile.  Advised that I attempted to reach her after speaking with Dr. Sabra Heck.  Dr. Sabra Heck aware patient having cramping and with two procedures this week, can be expected. Advised that narcotic rx would likely cause slowing of bowels and possible constipation. Patient does not want any further change in bowels.  Advised Dr. Sabra Heck ordered Motrin 800 mg po q 8 hours. Patient requests rx as covered under pharmacy plan. Rx ordered and sent to pharmacy of choice.  Patient advised that if still having cramping, should not go to work tomorrow. Patient is agreeable to this, but will need note for work, if she feels she can go. 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  Patient advised to call tomorrow morning at 0830 with update, if feeling better or not. Advised patient if feeling better, can return to work and has letter, if not feeling better, needs office visit with Dr. Quincy Simmonds for evaluation and can have note for work extending until 12/07/13, if necessary. Patient is agreeable to this plan. She would really like to go back to work and feels she can at this time.  She will pick up Motrin rx and call our office at any time if any changes to symptoms. Patient states she has been resting.   Routing update to Dr. Sabra Heck  cc Dr. Quincy Simmonds.

## 2013-12-04 ENCOUNTER — Telehealth (INDEPENDENT_AMBULATORY_CARE_PROVIDER_SITE_OTHER): Payer: Self-pay

## 2013-12-04 NOTE — Telephone Encounter (Signed)
Called pt to see about going ahead with scheduling the surgery to remove the colon mass. I wanted to check with the pt to see if she needed another office visit to discuss any details with Dr Johney Maine about surgery or if she was ok with just scheduling the sx date. The pt is fine with no appt with Dr Johney Maine and going ahead with getting a sx date. The pt advised me that she is no longer pregnant she had to have a D&C this past Monday due to problems with the baby. I advised pt that I would notify Dr Johney Maine. The pt wants to move forward with surgery. I will turn order into coding today for schedulers to call the pt.

## 2013-12-07 ENCOUNTER — Telehealth: Payer: Self-pay | Admitting: Obstetrics & Gynecology

## 2013-12-07 NOTE — Telephone Encounter (Signed)
Spoke with patient. Patient states that she received a note for work last week and her HR department needs it to state the days she was out as well. States that on Friday she began to have a "head cold" that is causing her throat to hurt. Patient also states that where her IV was inserted on her wrist she know has "red bumps that itch up to my elbow. It looks like I have bites. Is this normal?" Advised patient that the head cold has been going around but would not be associated with any complications. Advised may need to seek urgent care if worsens. Denies fever, nausea, and vomiting. Denies red streaks in arm or look of infected. Advised patient would send a message to [place] regarding patient's arm and call her back with further recommendations. Patient agreeable. Patient would like to know where results from biopsies from her colonoscopy would come from. Advised will hear back from [place] regarding these results. Patient is agreeable.  Dr.Miller, what do you recommend for patient's arm? Benadryl with anti itch cream? Evaluation?

## 2013-12-07 NOTE — Telephone Encounter (Signed)
Patient is returning a call to Kait[place]. °

## 2013-12-07 NOTE — Telephone Encounter (Signed)
Revised note in EPIC to include patient's dates of absence from work. Please review before printing to fax for patient.

## 2013-12-07 NOTE — Telephone Encounter (Signed)
Tried to reach patient on mobile number. There was no answer and unable to leave a voicemail. Will try again later.

## 2013-12-07 NOTE — Telephone Encounter (Signed)
Patient has a question for Dr.Miller's nurse.No further details given, not urgent .

## 2013-12-08 ENCOUNTER — Encounter: Payer: Self-pay | Admitting: Internal Medicine

## 2013-12-08 ENCOUNTER — Telehealth: Payer: Self-pay | Admitting: Internal Medicine

## 2013-12-08 NOTE — Telephone Encounter (Signed)
Pt is calling for her colon biopsy results. Please advise.

## 2013-12-08 NOTE — Telephone Encounter (Signed)
Tried to reach patient at number provided. No answer and unable to leave a voicemail. Will try again later. Letter to Dr.Miller's desk for signature.

## 2013-12-08 NOTE — Telephone Encounter (Signed)
Spoke with patient. Advised of message as seen below from [place]. Patient is agreeable and verbalizes understanding. Patient is going to come to pick up later today on lunch. Advised patient letter left at the front for pick up.  Routing to provider for final review. Patient agreeable to disposition. Will close encounter

## 2013-12-08 NOTE — Telephone Encounter (Signed)
Please check on pt today.  If worse, needs to be seen.  Please print letter for me to sign.

## 2013-12-08 NOTE — Telephone Encounter (Signed)
Just returned today. The biopsies show precancerous change. The polyp still could have cancer somewhere. In either event, this needs to be removed surgically. She needs to see Dr. Johney Maine soon. Thanks

## 2013-12-08 NOTE — Telephone Encounter (Signed)
Spoke with pt and she is aware. Pt has been in touch with Dr. Johney Maine' office and is waiting to hear back regarding her surgery appt. Pt knows to keep this appt.

## 2013-12-08 NOTE — Telephone Encounter (Signed)
Have her use topical benadryl cream if possible and take a 25mg  benadryl tonight to see if it will help.  I will see her Friday.  Thanks.  Letter signed and Claiborne Billings will bring it back to you.

## 2013-12-08 NOTE — Telephone Encounter (Signed)
Spoke with patient. Patient states that her arm is "the same." States that is looks like she has "misquito bites" and that it is really itching. Advised patient Dr.Miller would like her to be seen for evaluation. Patient declines. Patient states that she would like to wait until Friday appointment with Dr.Miller. Advised patient if arm worsens as in more bumps, swollen, red, looks infected, warm to the touch, fever, or nausea will need to be seen sooner. Patient is agreeable. Patient would like to pick up letter today on lunch. Advised once Dr.Miller has signed it we will place it at the front for pick up. Patient agreeable.  Routing to provider for final review. Patient agreeable to disposition. Will close encounter

## 2013-12-08 NOTE — Telephone Encounter (Signed)
Attempted to reach patient at number provided. No answer and unable to leave voicemail. Will try to reach patient again later.

## 2013-12-11 ENCOUNTER — Ambulatory Visit: Payer: BC Managed Care – PPO | Admitting: Obstetrics & Gynecology

## 2013-12-15 NOTE — Telephone Encounter (Signed)
Colon Done 12-02-13/yf

## 2013-12-24 ENCOUNTER — Telehealth: Payer: Self-pay | Admitting: Obstetrics & Gynecology

## 2013-12-24 NOTE — Telephone Encounter (Signed)
Return call to patient, unable to leave message.  

## 2013-12-24 NOTE — Telephone Encounter (Signed)
Pt had to cancel 2 wk po with DR Sabra Heck because of work. Please call to reschedule.

## 2013-12-25 ENCOUNTER — Ambulatory Visit: Payer: BC Managed Care – PPO | Admitting: Obstetrics & Gynecology

## 2013-12-25 NOTE — Telephone Encounter (Signed)
Spoke with patient. She is rescheduled for 12/28/13 at 1500. Routing to provider for final review. Patient agreeable to disposition. Will close encounter

## 2013-12-25 NOTE — Telephone Encounter (Signed)
Called patient. She will call back from work number.

## 2013-12-28 ENCOUNTER — Encounter: Payer: Self-pay | Admitting: Obstetrics & Gynecology

## 2013-12-28 ENCOUNTER — Ambulatory Visit (INDEPENDENT_AMBULATORY_CARE_PROVIDER_SITE_OTHER): Payer: BC Managed Care – PPO | Admitting: Obstetrics & Gynecology

## 2013-12-28 VITALS — BP 112/60 | HR 84 | Temp 98.1°F | Resp 16 | Wt 142.0 lb

## 2013-12-28 DIAGNOSIS — N912 Amenorrhea, unspecified: Secondary | ICD-10-CM

## 2013-12-28 DIAGNOSIS — Z30011 Encounter for initial prescription of contraceptive pills: Secondary | ICD-10-CM

## 2013-12-28 LAB — POCT URINE PREGNANCY: Preg Test, Ur: NEGATIVE

## 2013-12-28 MED ORDER — NORETHIN ACE-ETH ESTRAD-FE 1-20 MG-MCG PO TABS: 1.0000 | ORAL_TABLET | Freq: Every day | ORAL | Status: DC

## 2013-12-28 NOTE — Progress Notes (Signed)
Subjective:     Patient ID: Nichole Davis, female   DOB: 1985-06-21, 28 y.o.   MRN: [id number]  HPI 28 yo [id number] SHispanic female here for follow up after D&E for missed ab as well as to discuss plans for bowel surgery.  Bled for only two days after procedure.  Not taking anything for [facility].  Highly encouraged pt she needs to be on contraception until after all of the bowel surgery is completed and pathology back and she has healed for at least three months.  She is in agreement.    Denies pain or vaginal discharge.    Review of Systems  All other systems reviewed and are negative.      Objective:   Physical Exam  Constitutional: She is oriented to person, place, and time. She appears well-developed and well-nourished.  Genitourinary: Vagina normal. There is no rash, tenderness or lesion on the right labia. There is no rash, tenderness or lesion on the left labia. Cervix exhibits no motion tenderness.  Lymphadenopathy:       Right: No inguinal adenopathy present.       Left: No inguinal adenopathy present.  Neurological: She is alert and oriented to person, place, and time.  Skin: Skin is warm and dry.  Psychiatric: She has a normal mood and affect.       Assessment:     S/P D&E due to missed abortion Birth control needs     Plan:     Start Junel 1/20.  DVT/PE, increased BP, headache and nausea discussed.  Pt has been on pills in the past and has done well. Laparoscopic partial colectomy planned 11/12

## 2014-01-14 NOTE — Patient Instructions (Addendum)
Nichole Davis  01/14/2014   Your procedure is scheduled on:  01/28/2014   Report to [facility]  Follow the Signs to [place] at   0530     am  Call this number if you have problems the morning of surgery: [phone]   Remember:   Do not eat food or drink liquids after midnight.   Take these medicines the morning of surgery with A SIP OF WATER: none   Do not wear jewelry, make-up or nail polish.  Do not wear lotions, powders, or perfumes.  deodorant.  Do not shave 48 hours prior to surgery.   Do not bring valuables to the hospital.  Contacts, dentures or bridgework may not be worn into surgery.  Leave suitcase in the car. After surgery it may be brought to your room.  For patients admitted to the hospital, checkout time is 11:00 AM the day of  discharge.   :    Please read over the following fact sheets that you were given: [facility] - Preparing for Surgery Before surgery, you can play an important role.  Because skin is not sterile, your skin needs to be as free of germs as possible.  You can reduce the number of germs on your skin by washing with CHG (chlorahexidine gluconate) soap before surgery.  CHG is an antiseptic cleaner which kills germs and bonds with the skin to continue killing germs even after washing. Please DO NOT use if you have an allergy to CHG or antibacterial soaps.  If your skin becomes reddened/irritated stop using the CHG and inform your nurse when you arrive at Short Stay. Do not shave (including legs and underarms) for at least 48 hours prior to the first CHG shower.  You may shave your face/neck. Please follow these instructions carefully:  1.  Shower with CHG Soap the night before surgery and the  morning of Surgery.  2.  If you choose to wash your hair, wash your hair first as usual with your  normal  shampoo.  3.  After you shampoo, rinse your hair and body thoroughly to remove the  shampoo.                           4.  Use CHG as  you would any other liquid soap.  You can apply chg directly  to the skin and wash                       Gently with a scrungie or clean washcloth.  5.  Apply the CHG Soap to your body ONLY FROM THE NECK DOWN.   Do not use on face/ open                           Wound or open sores. Avoid contact with eyes, ears mouth and genitals (private parts).                       Wash face,  Genitals (private parts) with your normal soap.             6.  Wash thoroughly, paying special attention to the area where your surgery  will be performed.  7.  Thoroughly rinse your body with warm water from the neck down.  8.  DO NOT shower/wash with your normal soap after using and  rinsing off  the CHG Soap.                9.  Pat yourself dry with a clean towel.            10.  Wear clean pajamas.            11.  Place clean sheets on your bed the night of your first shower and do not  sleep with pets. Day of Surgery : Do not apply any lotions/deodorants the morning of surgery.  Please wear clean clothes to the hospital/surgery center.  FAILURE TO FOLLOW THESE INSTRUCTIONS MAY RESULT IN THE CANCELLATION OF YOUR SURGERY PATIENT SIGNATURE_________________________________  NURSE SIGNATURE__________________________________  ________________________________________________________________________  WHAT IS A BLOOD TRANSFUSION? Blood Transfusion Information  A transfusion is the replacement of blood or some of its parts. Blood is made up of multiple cells which provide different functions.  Red blood cells carry oxygen and are used for blood loss replacement.  White blood cells fight against infection.  Platelets control bleeding.  Plasma helps clot blood.  Other blood products are available for specialized needs, such as hemophilia or other clotting disorders. BEFORE THE TRANSFUSION  Who gives blood for transfusions?   Healthy volunteers who are fully evaluated to make sure their blood is safe. This is  blood bank blood. Transfusion therapy is the safest it has ever been in the practice of medicine. Before blood is taken from a donor, a complete history is taken to make sure that person has no history of diseases nor engages in risky social behavior (examples are intravenous drug use or sexual activity with multiple partners). The donor's travel history is screened to minimize risk of transmitting infections, such as malaria. The donated blood is tested for signs of infectious diseases, such as HIV and hepatitis. The blood is then tested to be sure it is compatible with you in order to minimize the chance of a transfusion reaction. If you or a relative donates blood, this is often done in anticipation of surgery and is not appropriate for emergency situations. It takes many days to process the donated blood. RISKS AND COMPLICATIONS Although transfusion therapy is very safe and saves many lives, the main dangers of transfusion include:   Getting an infectious disease.  Developing a transfusion reaction. This is an allergic reaction to something in the blood you were given. Every precaution is taken to prevent this. The decision to have a blood transfusion has been considered carefully by your caregiver before blood is given. Blood is not given unless the benefits outweigh the risks. AFTER THE TRANSFUSION  Right after receiving a blood transfusion, you will usually feel much better and more energetic. This is especially true if your red blood cells have gotten low (anemic). The transfusion raises the level of the red blood cells which carry oxygen, and this usually causes an energy increase.  The nurse administering the transfusion will monitor you carefully for complications. HOME CARE INSTRUCTIONS  No special instructions are needed after a transfusion. You may find your energy is better. Speak with your caregiver about any limitations on activity for underlying diseases you may have. SEEK MEDICAL  CARE IF:   Your condition is not improving after your transfusion.  You develop redness or irritation at the intravenous (IV) site. SEEK IMMEDIATE MEDICAL CARE IF:  Any of the following symptoms occur over the next 12 hours:  Shaking chills.  You have a temperature by mouth above 102 F (38.9 C),  not controlled by medicine.  Chest, back, or muscle pain.  People around you feel you are not acting correctly or are confused.  Shortness of breath or difficulty breathing.  Dizziness and fainting.  You get a rash or develop hives.  You have a decrease in urine output.  Your urine turns a dark color or changes to pink, red, or brown. Any of the following symptoms occur over the next 10 days:  You have a temperature by mouth above 102 F (38.9 C), not controlled by medicine.  Shortness of breath.  Weakness after normal activity.  The white part of the eye turns yellow (jaundice).  You have a decrease in the amount of urine or are urinating less often.  Your urine turns a dark color or changes to pink, red, or brown. Document Released: 03/02/2000 Document Revised: 05/28/2011 Document Reviewed: 10/20/2007 ExitCare Patient Information 2014 [place].  _______________________________________________________________________, coughing and deep breathing exercises, leg exercises

## 2014-01-15 ENCOUNTER — Encounter (HOSPITAL_COMMUNITY): Payer: Self-pay | Admitting: Pharmacy Technician

## 2014-01-15 ENCOUNTER — Encounter (HOSPITAL_COMMUNITY)
Admission: RE | Admit: 2014-01-15 | Discharge: 2014-01-15 | Disposition: A | Discharge status: Home / Self Care | Payer: BC Managed Care – PPO | Source: Ambulatory Visit | Attending: Surgery | Admitting: Surgery

## 2014-01-15 ENCOUNTER — Encounter (HOSPITAL_COMMUNITY): Payer: Self-pay

## 2014-01-15 DIAGNOSIS — Z01812 Encounter for preprocedural laboratory examination: Secondary | ICD-10-CM | POA: Diagnosis present

## 2014-01-15 LAB — CBC
HCT: 35.8 % — ABNORMAL LOW (ref 36.0–46.0)
Hemoglobin: 11.3 g/dL — ABNORMAL LOW (ref 12.0–15.0)
MCH: 24.7 pg — ABNORMAL LOW (ref 26.0–34.0)
MCHC: 31.6 g/dL (ref 30.0–36.0)
MCV: 78.3 fL (ref 78.0–100.0)
Platelets: 321 10*3/uL (ref 150–400)
RBC: 4.57 MIL/uL (ref 3.87–5.11)
RDW: 15.4 % (ref 11.5–15.5)
WBC: 6.5 10*3/uL (ref 4.0–10.5)

## 2014-01-15 LAB — HEMOGLOBIN A1C
Hgb A1c MFr Bld: 5.2 % (ref <5.7)
MEAN PLASMA GLUCOSE: 103 mg/dL (ref <117)

## 2014-01-15 LAB — BASIC METABOLIC PANEL
ANION GAP: 11 (ref 5–15)
BUN: 12 mg/dL (ref 6–23)
CO2: 23 mEq/L (ref 19–32)
CREATININE: 0.71 mg/dL (ref 0.50–1.10)
Calcium: 8.9 mg/dL (ref 8.4–10.5)
Chloride: 104 mEq/L (ref 96–112)
Glucose, Bld: 90 mg/dL (ref 70–99)
POTASSIUM: 4.3 meq/L (ref 3.7–5.3)
Sodium: 138 mEq/L (ref 137–147)

## 2014-01-15 LAB — HCG, SERUM, QUALITATIVE: Preg, Serum: NEGATIVE

## 2014-01-18 ENCOUNTER — Encounter (HOSPITAL_COMMUNITY): Payer: Self-pay

## 2014-01-27 MED ORDER — SODIUM CHLORIDE 0.9 % IV SOLN
INTRAVENOUS | Status: DC
  Filled 2014-01-27: qty 6

## 2014-01-27 NOTE — Anesthesia Preprocedure Evaluation (Addendum)
Anesthesia Evaluation  Patient identified by MRN, date of birth, ID band Patient awake    Reviewed: Allergy & Precautions, H&P , NPO status , Patient's Chart, lab work & pertinent test results  Airway Mallampati: II  TM Distance: >3 FB Neck ROM: Full    Dental no notable dental hx.    Pulmonary neg pulmonary ROS,  breath sounds clear to auscultation  Pulmonary exam normal       Cardiovascular negative cardio ROS  Rhythm:Regular Rate:Normal     Neuro/Psych negative neurological ROS  negative psych ROS   GI/Hepatic negative GI ROS, Neg liver ROS,   Endo/Other  negative endocrine ROS  Renal/GU negative Renal ROS  negative genitourinary   Musculoskeletal negative musculoskeletal ROS (+)   Abdominal   Peds negative pediatric ROS (+)  Hematology negative hematology ROS (+) anemia ,   Anesthesia Other Findings   Reproductive/Obstetrics negative OB ROS                            Anesthesia Physical Anesthesia Plan  ASA: II  Anesthesia Plan: General   Post-op Pain Management:    Induction: Intravenous  Airway Management Planned: Oral ETT  Additional Equipment:   Intra-op Plan:   Post-operative Plan: Extubation in OR  Informed Consent: I have reviewed the patients History and Physical, chart, labs and discussed the procedure including the risks, benefits and alternatives for the proposed anesthesia with the patient or authorized representative who has indicated his/her understanding and acceptance.   Dental advisory given  Plan Discussed with: CRNA and Surgeon  Anesthesia Plan Comments:         Anesthesia Quick Evaluation

## 2014-01-28 ENCOUNTER — Encounter (HOSPITAL_COMMUNITY): Payer: Self-pay

## 2014-01-28 ENCOUNTER — Encounter (HOSPITAL_COMMUNITY)
Admission: RE | Disposition: A | Discharge status: Home / Self Care | Payer: Self-pay | Source: Ambulatory Visit | Attending: Surgery

## 2014-01-28 ENCOUNTER — Inpatient Hospital Stay (HOSPITAL_COMMUNITY): Payer: BC Managed Care – PPO | Admitting: Anesthesiology

## 2014-01-28 ENCOUNTER — Inpatient Hospital Stay (HOSPITAL_COMMUNITY)
Admission: RE | Admit: 2014-01-28 | Discharge: 2014-01-31 | DRG: 331 | Disposition: A | Discharge status: Home / Self Care | Payer: BC Managed Care – PPO | Source: Ambulatory Visit | Attending: Surgery | Admitting: Surgery

## 2014-01-28 DIAGNOSIS — D125 Benign neoplasm of sigmoid colon: Secondary | ICD-10-CM | POA: Diagnosis present

## 2014-01-28 DIAGNOSIS — K639 Disease of intestine, unspecified: Secondary | ICD-10-CM | POA: Diagnosis present

## 2014-01-28 DIAGNOSIS — D126 Benign neoplasm of colon, unspecified: Secondary | ICD-10-CM | POA: Diagnosis present

## 2014-01-28 HISTORY — PX: LAPAROSCOPIC SIGMOID COLECTOMY: SHX5928

## 2014-01-28 HISTORY — PX: PROCTOSCOPY: SHX2266

## 2014-01-28 LAB — TYPE AND SCREEN
ABO/RH(D): A POS
Antibody Screen: NEGATIVE

## 2014-01-28 SURGERY — COLECTOMY, SIGMOID, LAPAROSCOPIC
Anesthesia: General

## 2014-01-28 MED ORDER — BUPIVACAINE-EPINEPHRINE 0.25% -1:200000 IJ SOLN
INTRAMUSCULAR | Status: DC | PRN
  Administered 2014-01-28: 60 mL

## 2014-01-28 MED ORDER — PROMETHAZINE HCL 25 MG/ML IJ SOLN: 6.2500 mg | INTRAMUSCULAR | Status: DC | PRN

## 2014-01-28 MED ORDER — DIPHENHYDRAMINE HCL 12.5 MG/5ML PO ELIX: 12.5000 mg | ORAL_SOLUTION | Freq: Four times a day (QID) | ORAL | Status: DC | PRN

## 2014-01-28 MED ORDER — CEFOTETAN DISODIUM-DEXTROSE 2-2.08 GM-% IV SOLR
INTRAVENOUS | Status: AC
  Filled 2014-01-28: qty 50

## 2014-01-28 MED ORDER — METOPROLOL TARTRATE 1 MG/ML IV SOLN
5.0000 mg | Freq: Four times a day (QID) | INTRAVENOUS | Status: DC
  Administered 2014-01-29: 5 mg via INTRAVENOUS
  Filled 2014-01-28 (×8): qty 5

## 2014-01-28 MED ORDER — DIPHENHYDRAMINE HCL 50 MG/ML IJ SOLN: 12.5000 mg | Freq: Four times a day (QID) | INTRAMUSCULAR | Status: DC | PRN

## 2014-01-28 MED ORDER — SODIUM CHLORIDE 0.9 % IV SOLN
INTRAVENOUS | Status: DC
  Administered 2014-01-28 – 2014-01-29 (×2): via INTRAVENOUS
  Administered 2014-01-29 – 2014-01-30 (×2): 75 mL via INTRAVENOUS

## 2014-01-28 MED ORDER — OXYCODONE HCL 5 MG PO TABS: 5.0000 mg | ORAL_TABLET | ORAL | Status: DC | PRN

## 2014-01-28 MED ORDER — HEPARIN SODIUM (PORCINE) 5000 UNIT/ML IJ SOLN
5000.0000 [IU] | Freq: Once | INTRAMUSCULAR | Status: AC
  Administered 2014-01-28: 5000 [IU] via SUBCUTANEOUS
  Filled 2014-01-28: qty 1

## 2014-01-28 MED ORDER — LACTATED RINGERS IV BOLUS (SEPSIS): 1000.0000 mL | Freq: Three times a day (TID) | INTRAVENOUS | Status: AC | PRN

## 2014-01-28 MED ORDER — ACETAMINOPHEN 500 MG PO TABS
1000.0000 mg | ORAL_TABLET | Freq: Three times a day (TID) | ORAL | Status: DC
  Administered 2014-01-28 – 2014-01-31 (×10): 1000 mg via ORAL
  Filled 2014-01-28 (×19): qty 2

## 2014-01-28 MED ORDER — BUPIVACAINE-EPINEPHRINE 0.25% -1:200000 IJ SOLN
INTRAMUSCULAR | Status: AC
  Filled 2014-01-28: qty 1

## 2014-01-28 MED ORDER — CEFOTETAN DISODIUM 2 G IJ SOLR
2.0000 g | INTRAMUSCULAR | Status: AC
  Administered 2014-01-28: 2 g via INTRAVENOUS
  Filled 2014-01-28: qty 2

## 2014-01-28 MED ORDER — MAGIC MOUTHWASH: 15.0000 mL | Freq: Four times a day (QID) | ORAL | Status: DC | PRN

## 2014-01-28 MED ORDER — METOCLOPRAMIDE HCL 5 MG/ML IJ SOLN
INTRAMUSCULAR | Status: AC
  Filled 2014-01-28: qty 2

## 2014-01-28 MED ORDER — FENTANYL CITRATE 0.05 MG/ML IJ SOLN
INTRAMUSCULAR | Status: DC | PRN
  Administered 2014-01-28: 100 ug via INTRAVENOUS
  Administered 2014-01-28: 50 ug via INTRAVENOUS

## 2014-01-28 MED ORDER — LIP MEDEX EX OINT
1.0000 "application " | TOPICAL_OINTMENT | Freq: Two times a day (BID) | CUTANEOUS | Status: DC
  Administered 2014-01-28 – 2014-01-31 (×6): 1 via TOPICAL
  Filled 2014-01-28: qty 7

## 2014-01-28 MED ORDER — NORETHIN ACE-ETH ESTRAD-FE 1-20 MG-MCG PO TABS: 1.0000 | ORAL_TABLET | Freq: Every day | ORAL | Status: DC

## 2014-01-28 MED ORDER — DEXAMETHASONE SODIUM PHOSPHATE 10 MG/ML IJ SOLN
INTRAMUSCULAR | Status: AC
  Filled 2014-01-28: qty 1

## 2014-01-28 MED ORDER — MIDAZOLAM HCL 5 MG/5ML IJ SOLN
INTRAMUSCULAR | Status: DC | PRN
  Administered 2014-01-28: 2 mg via INTRAVENOUS

## 2014-01-28 MED ORDER — ONDANSETRON HCL 4 MG/2ML IJ SOLN
4.0000 mg | Freq: Four times a day (QID) | INTRAMUSCULAR | Status: DC | PRN
  Administered 2014-01-30: 4 mg via INTRAVENOUS
  Filled 2014-01-28: qty 2

## 2014-01-28 MED ORDER — MENTHOL 3 MG MT LOZG: 1.0000 | LOZENGE | OROMUCOSAL | Status: DC | PRN

## 2014-01-28 MED ORDER — ALVIMOPAN 12 MG PO CAPS
12.0000 mg | ORAL_CAPSULE | Freq: Two times a day (BID) | ORAL | Status: DC
  Administered 2014-01-29 (×2): 12 mg via ORAL
  Filled 2014-01-28 (×4): qty 1

## 2014-01-28 MED ORDER — PROPOFOL 10 MG/ML IV BOLUS
INTRAVENOUS | Status: DC | PRN
  Administered 2014-01-28: 130 mg via INTRAVENOUS

## 2014-01-28 MED ORDER — ACETAMINOPHEN 10 MG/ML IV SOLN
1000.0000 mg | Freq: Once | INTRAVENOUS | Status: AC
  Administered 2014-01-28: 1000 mg via INTRAVENOUS
  Filled 2014-01-28: qty 100

## 2014-01-28 MED ORDER — FENTANYL CITRATE 0.05 MG/ML IJ SOLN
INTRAMUSCULAR | Status: AC
  Filled 2014-01-28: qty 5

## 2014-01-28 MED ORDER — NEOSTIGMINE METHYLSULFATE 10 MG/10ML IV SOLN
INTRAVENOUS | Status: DC | PRN
  Administered 2014-01-28: 4 mg via INTRAVENOUS

## 2014-01-28 MED ORDER — KETOROLAC TROMETHAMINE 30 MG/ML IJ SOLN
INTRAMUSCULAR | Status: AC
  Filled 2014-01-28: qty 1

## 2014-01-28 MED ORDER — PROPOFOL 10 MG/ML IV BOLUS
INTRAVENOUS | Status: AC
  Filled 2014-01-28: qty 20

## 2014-01-28 MED ORDER — GLYCOPYRROLATE 0.2 MG/ML IJ SOLN
INTRAMUSCULAR | Status: DC | PRN
  Administered 2014-01-28: 0.6 mg via INTRAVENOUS

## 2014-01-28 MED ORDER — HEPARIN SODIUM (PORCINE) 5000 UNIT/ML IJ SOLN
5000.0000 [IU] | Freq: Three times a day (TID) | INTRAMUSCULAR | Status: DC
  Administered 2014-01-28 – 2014-01-30 (×7): 5000 [IU] via SUBCUTANEOUS
  Filled 2014-01-28 (×11): qty 1

## 2014-01-28 MED ORDER — LACTATED RINGERS IV SOLN: INTRAVENOUS | Status: DC

## 2014-01-28 MED ORDER — GLYCOPYRROLATE 0.2 MG/ML IJ SOLN
INTRAMUSCULAR | Status: AC
  Filled 2014-01-28: qty 3

## 2014-01-28 MED ORDER — HYDROMORPHONE HCL 1 MG/ML IJ SOLN: 0.2500 mg | INTRAMUSCULAR | Status: DC | PRN

## 2014-01-28 MED ORDER — KETOROLAC TROMETHAMINE 30 MG/ML IJ SOLN
INTRAMUSCULAR | Status: DC | PRN
  Administered 2014-01-28: 30 mg via INTRAVENOUS

## 2014-01-28 MED ORDER — ALUM & MAG HYDROXIDE-SIMETH 200-200-20 MG/5ML PO SUSP: 30.0000 mL | Freq: Four times a day (QID) | ORAL | Status: DC | PRN

## 2014-01-28 MED ORDER — 0.9 % SODIUM CHLORIDE (POUR BTL) OPTIME
TOPICAL | Status: DC | PRN
  Administered 2014-01-28: 2000 mL

## 2014-01-28 MED ORDER — PHENOL 1.4 % MT LIQD: 2.0000 | OROMUCOSAL | Status: DC | PRN

## 2014-01-28 MED ORDER — ONDANSETRON HCL 4 MG PO TABS: 4.0000 mg | ORAL_TABLET | Freq: Four times a day (QID) | ORAL | Status: DC | PRN

## 2014-01-28 MED ORDER — CISATRACURIUM BESYLATE 20 MG/10ML IV SOLN
INTRAVENOUS | Status: AC
  Filled 2014-01-28: qty 10

## 2014-01-28 MED ORDER — MIDAZOLAM HCL 2 MG/2ML IJ SOLN
INTRAMUSCULAR | Status: AC
  Filled 2014-01-28: qty 2

## 2014-01-28 MED ORDER — DEXAMETHASONE SODIUM PHOSPHATE 10 MG/ML IJ SOLN
INTRAMUSCULAR | Status: DC | PRN
  Administered 2014-01-28: 10 mg via INTRAVENOUS

## 2014-01-28 MED ORDER — ONDANSETRON HCL 4 MG/2ML IJ SOLN
INTRAMUSCULAR | Status: DC | PRN
  Administered 2014-01-28: 4 mg via INTRAVENOUS

## 2014-01-28 MED ORDER — METOCLOPRAMIDE HCL 5 MG/ML IJ SOLN
INTRAMUSCULAR | Status: DC | PRN
  Administered 2014-01-28: 10 mg via INTRAVENOUS

## 2014-01-28 MED ORDER — INFLUENZA VAC SPLIT QUAD 0.5 ML IM SUSY
0.5000 mL | PREFILLED_SYRINGE | INTRAMUSCULAR | Status: DC
  Filled 2014-01-28 (×2): qty 0.5

## 2014-01-28 MED ORDER — ALVIMOPAN 12 MG PO CAPS
12.0000 mg | ORAL_CAPSULE | Freq: Once | ORAL | Status: AC
  Administered 2014-01-28: 12 mg via ORAL
  Filled 2014-01-28: qty 1

## 2014-01-28 MED ORDER — SACCHAROMYCES BOULARDII 250 MG PO CAPS
250.0000 mg | ORAL_CAPSULE | Freq: Two times a day (BID) | ORAL | Status: DC
  Administered 2014-01-28 – 2014-01-31 (×6): 250 mg via ORAL
  Filled 2014-01-28 (×7): qty 1

## 2014-01-28 MED ORDER — DEXTROSE 5 % IV SOLN
2.0000 g | Freq: Two times a day (BID) | INTRAVENOUS | Status: AC
  Administered 2014-01-28: 2 g via INTRAVENOUS
  Filled 2014-01-28: qty 2

## 2014-01-28 MED ORDER — ONDANSETRON HCL 4 MG/2ML IJ SOLN
INTRAMUSCULAR | Status: AC
  Filled 2014-01-28: qty 2

## 2014-01-28 MED ORDER — LACTATED RINGERS IR SOLN
Status: DC | PRN
  Administered 2014-01-28: 1000 mL

## 2014-01-28 MED ORDER — BUPIVACAINE-EPINEPHRINE (PF) 0.25% -1:200000 IJ SOLN
INTRAMUSCULAR | Status: AC
  Filled 2014-01-28: qty 30

## 2014-01-28 MED ORDER — HYDROMORPHONE HCL 1 MG/ML IJ SOLN
0.5000 mg | INTRAMUSCULAR | Status: DC | PRN
  Administered 2014-01-29 (×2): 1 mg via INTRAVENOUS
  Filled 2014-01-28 (×2): qty 1

## 2014-01-28 MED ORDER — KETOROLAC TROMETHAMINE 30 MG/ML IJ SOLN
15.0000 mg | Freq: Once | INTRAMUSCULAR | Status: AC | PRN
  Administered 2014-01-28: 30 mg via INTRAVENOUS

## 2014-01-28 MED ORDER — LACTATED RINGERS IV SOLN
INTRAVENOUS | Status: DC | PRN
  Administered 2014-01-28 (×3): via INTRAVENOUS

## 2014-01-28 MED ORDER — CISATRACURIUM BESYLATE (PF) 10 MG/5ML IV SOLN
INTRAVENOUS | Status: DC | PRN
  Administered 2014-01-28: 2 mg via INTRAVENOUS
  Administered 2014-01-28: 10 mg via INTRAVENOUS
  Administered 2014-01-28: 4 mg via INTRAVENOUS

## 2014-01-28 SURGICAL SUPPLY — 83 items
APPLIER CLIP 5 13 M/L LIGAMAX5 (MISCELLANEOUS)
APPLIER CLIP ROT 10 11.4 M/L (STAPLE)
APR CLP MED LRG 11.4X10 (STAPLE)
APR CLP MED LRG 5 ANG JAW (MISCELLANEOUS)
BLADE EXTENDED COATED 6.5IN (ELECTRODE) ×1 IMPLANT
BLADE SURG SZ10 CARB STEEL (BLADE) ×1 IMPLANT
CABLE HIGH FREQUENCY MONO STRZ (ELECTRODE) ×2 IMPLANT
CANISTER SUCTION 2500CC (MISCELLANEOUS) ×1 IMPLANT
CATH KIT ON Q 7.5IN SLV (PAIN MANAGEMENT) IMPLANT
CELLS DAT CNTRL 66122 CELL SVR (MISCELLANEOUS) IMPLANT
CHLORAPREP W/TINT 26ML (MISCELLANEOUS) ×2 IMPLANT
CLIP APPLIE 5 13 M/L LIGAMAX5 (MISCELLANEOUS) IMPLANT
CLIP APPLIE ROT 10 11.4 M/L (STAPLE) IMPLANT
COUNTER NEEDLE 20 DBL MAG RED (NEEDLE) ×1 IMPLANT
COVER MAYO STAND STRL (DRAPES) ×2 IMPLANT
DECANTER SPIKE VIAL GLASS SM (MISCELLANEOUS) ×2 IMPLANT
DRAIN CHANNEL 19F RND (DRAIN) IMPLANT
DRAPE LAPAROSCOPIC ABDOMINAL (DRAPES) ×2 IMPLANT
DRAPE SHEET LG 3/4 BI-LAMINATE (DRAPES) ×3 IMPLANT
DRAPE UTILITY XL STRL (DRAPES) ×2 IMPLANT
DRAPE WARM FLUID 44X44 (DRAPE) ×3 IMPLANT
DRSG OPSITE POSTOP 4X10 (GAUZE/BANDAGES/DRESSINGS) IMPLANT
DRSG OPSITE POSTOP 4X6 (GAUZE/BANDAGES/DRESSINGS) ×1 IMPLANT
DRSG OPSITE POSTOP 4X8 (GAUZE/BANDAGES/DRESSINGS) IMPLANT
DRSG TEGADERM 2-3/8X2-3/4 SM (GAUZE/BANDAGES/DRESSINGS) ×2 IMPLANT
DRSG TEGADERM 4X4.75 (GAUZE/BANDAGES/DRESSINGS) ×1 IMPLANT
ELECT PENCIL ROCKER SW 15FT (MISCELLANEOUS) ×4 IMPLANT
ELECT REM PT RETURN 9FT ADLT (ELECTROSURGICAL) ×2
ELECTRODE REM PT RTRN 9FT ADLT (ELECTROSURGICAL) ×1 IMPLANT
EVACUATOR SILICONE 100CC (DRAIN) IMPLANT
GAUZE SPONGE 2X2 8PLY STRL LF (GAUZE/BANDAGES/DRESSINGS) IMPLANT
GAUZE SPONGE 4X4 12PLY STRL (GAUZE/BANDAGES/DRESSINGS) ×1 IMPLANT
GLOVE ECLIPSE 8.0 STRL XLNG CF (GLOVE) ×4 IMPLANT
GLOVE INDICATOR 8.0 STRL GRN (GLOVE) ×4 IMPLANT
GOWN STRL REUS W/TWL XL LVL3 (GOWN DISPOSABLE) ×12 IMPLANT
KIT BASIN OR (CUSTOM PROCEDURE TRAY) ×3 IMPLANT
LEGGING LITHOTOMY PAIR STRL (DRAPES) ×2 IMPLANT
LUBRICANT JELLY K Y 4OZ (MISCELLANEOUS) ×1 IMPLANT
MANIFOLD NEPTUNE II (INSTRUMENTS) ×1 IMPLANT
PACK GENERAL/GYN (CUSTOM PROCEDURE TRAY) ×2 IMPLANT
PORT LAP GEL ALEXIS MED 5-9CM (MISCELLANEOUS) ×1 IMPLANT
PUMP PAIN ON-Q (MISCELLANEOUS) IMPLANT
RETRACTOR WND ALEXIS 18 MED (MISCELLANEOUS) IMPLANT
RTRCTR WOUND ALEXIS 18CM MED (MISCELLANEOUS)
SCISSORS LAP 5X35 DISP (ENDOMECHANICALS) ×2 IMPLANT
SEALER TISSUE G2 STRG ARTC 35C (ENDOMECHANICALS) ×1 IMPLANT
SET IRRIG TUBING LAPAROSCOPIC (IRRIGATION / IRRIGATOR) ×2 IMPLANT
SLEEVE XCEL OPT CAN 5 100 (ENDOMECHANICALS) ×5 IMPLANT
SPONGE GAUZE 2X2 STER 10/PKG (GAUZE/BANDAGES/DRESSINGS)
SPONGE LAP 18X18 X RAY DECT (DISPOSABLE) ×2 IMPLANT
STAPLER CIRC ILS CVD 33MM 37CM (STAPLE) ×1 IMPLANT
STAPLER CUT CVD 40MM BLUE (STAPLE) ×1 IMPLANT
STAPLER VISISTAT 35W (STAPLE) IMPLANT
SUCTION POOLE TIP (SUCTIONS) ×3 IMPLANT
SUT MNCRL AB 4-0 PS2 18 (SUTURE) ×2 IMPLANT
SUT PDS AB 0 CT1 36 (SUTURE) ×2 IMPLANT
SUT PDS AB 1 CTX 36 (SUTURE) IMPLANT
SUT PDS AB 1 TP1 96 (SUTURE) IMPLANT
SUT PROLENE 0 CT 2 (SUTURE) ×1 IMPLANT
SUT SILK 2 0 (SUTURE) ×2
SUT SILK 2 0 SH CR/8 (SUTURE) ×2 IMPLANT
SUT SILK 2-0 18XBRD TIE 12 (SUTURE) ×1 IMPLANT
SUT SILK 3 0 (SUTURE) ×2
SUT SILK 3 0 SH CR/8 (SUTURE) ×2 IMPLANT
SUT SILK 3-0 18XBRD TIE 12 (SUTURE) ×1 IMPLANT
SUT V-LOC BARB 180 2/0GR6 GS22 (SUTURE)
SUT VLOC 180 2-0 9IN GS21 (SUTURE) IMPLANT
SUTURE V-LC BRB 180 2/0GR6GS22 (SUTURE) IMPLANT
SYR BULB IRRIGATION 50ML (SYRINGE) ×1 IMPLANT
SYS LAPSCP GELPORT 120MM (MISCELLANEOUS)
SYSTEM LAPSCP GELPORT 120MM (MISCELLANEOUS) IMPLANT
TAPE UMBILICAL COTTON 1/8X30 (MISCELLANEOUS) IMPLANT
TOWEL OR 17X26 10 PK STRL BLUE (TOWEL DISPOSABLE) ×3 IMPLANT
TOWEL OR NON WOVEN STRL DISP B (DISPOSABLE) ×3 IMPLANT
TRAY FOLEY CATH 14FRSI W/METER (CATHETERS) ×2 IMPLANT
TRAY LAPAROSCOPIC (CUSTOM PROCEDURE TRAY) ×2 IMPLANT
TROCAR BLADELESS OPT 5 100 (ENDOMECHANICALS) ×2 IMPLANT
TROCAR XCEL 12X100 BLDLESS (ENDOMECHANICALS) IMPLANT
TROCAR XCEL NON-BLD 11X100MML (ENDOMECHANICALS) IMPLANT
TUBING CONNECTING 10 (TUBING) IMPLANT
TUBING FILTER THERMOFLATOR (ELECTROSURGICAL) ×2 IMPLANT
TUNNELER SHEATH ON-Q 16GX12 DP (PAIN MANAGEMENT) IMPLANT
YANKAUER SUCT BULB TIP 10FT TU (MISCELLANEOUS) ×2 IMPLANT

## 2014-01-28 NOTE — Op Note (Addendum)
01/28/2014  10:41 AM  PATIENT:  Nichole Davis  28 y.o. female  Patient Care Team: No Pcp Per Patient as PCP - General (General Practice) Michael Boston, MD as Consulting Physician (General Surgery) Irene Shipper, MD as Consulting Physician (Gastroenterology) Lyman Speller, MD as Consulting Physician (Gynecology) Juanita Craver, MD as Consulting Physician (Gastroenterology)  PRE-OPERATIVE DIAGNOSIS:  Sigmoid Colon Tubulovillous adenoma  POST-OPERATIVE DIAGNOSIS:  Sigmoid Colon Tubulovillous adenoma  PROCEDURE:  Procedure(s): LAPAROSCOPIC SIGMOID COLECTOMY  RIGID PROCTOSCOPY  SURGEON:  Surgeon(s): Michael Boston, MD Donnie Mesa, MD - Assist  ANESTHESIA:   local and general  EBL:  Total I/O In: 2550 [I.V.:2500; IV Piggyback:50] Out: 200 [Urine:100; Blood:100]  Delay start of Pharmacological VTE agent (>24hrs) due to surgical blood loss or risk of bleeding:  no  DRAINS: none   SPECIMEN:  Source of Specimen:    SIGMOID COLON.  TATTOO DISTAL TO LARGE PROXIMAL SIGMOID COLON MASS.  OPEN END PROXIMAL.  ANASTOMOTIC RINGS.  BLUE STITCH IN THE PROXIMAL RING.  OTHER RING IS FINAL DISTAL MARGIN.  DISPOSITION OF SPECIMEN:  PATHOLOGY  COUNTS:  YES  PLAN OF CARE: Admit to inpatient   PATIENT DISPOSITION:  PACU - hemodynamically stable.  INDICATION:    Young female with pelvic mass found on CAT scan.  Initially thought to be adnexa but then on follow-up study had moved.  Seemed more intraluminal.  Workup delayed because she became pregnant.  Had spontaneous miscarriage.  D&C done.  Colonoscopy revealed large intraluminal proximal sigmoid colon mass.  Biopsy consistent with tubulovillous adenoma.  Surgery recommended.  I recommended segmental resection:  The anatomy & physiology of the digestive tract was discussed.  The pathophysiology was discussed.  Natural history risks without surgery was discussed.   I worked to give an overview of the disease and the frequent need to have  multispecialty involvement.  I feel the risks of no intervention will lead to serious problems that outweigh the operative risks; therefore, I recommended a partial colectomy to remove the pathology.  Laparoscopic & open techniques were discussed.   Risks such as bleeding, infection, abscess, leak, reoperation, possible ostomy, hernia, heart attack, death, and other risks were discussed.  I noted a good likelihood this will help address the problem.   Goals of post-operative recovery were discussed as well.  We will work to minimize complications.  Educational materials on the pathology had been given in the office.  Questions were answered.    The patient expressed understanding & wished to proceed with surgery.  OR FINDINGS:   Patient had obvious tattooing in proximal sigmoid colon with bulky mass and proximal sigmoid colon.  No obvious metastatic disease on visceral parietal peritoneum or liver.  The anastomosis rests 16 cm from the anal verge by rigid proctoscopy.  DESCRIPTION:   Informed consent was confirmed.  The patient underwent general anaesthesia without difficulty.  The patient was positioned appropriately.  VTE prevention in place.  She had not done a bowel prep.  Digital rectal exam revealed no stool in the rectal vault.  The patient's abdomen was clipped, prepped, & draped in a sterile fashion.  Surgical timeout confirmed our plan.  The patient was positioned in reverse Trendelenburg.  Abdominal entry was gained using optical entry technique in the right upper abdomen.  Entry was clean.  I induced carbon dioxide insufflation.  Camera inspection revealed no injury.  Extra ports were carefully placed under direct laparoscopic visualization.  I reflected the greater omentum and the upper  abdomen the small bowel in the upper abdomen. Could see the obvious tattooing in a redundant stretched out sigmoid colon.  Transverse and proximal descending colon with some thickening mobile stool.   Could feel a large intraluminal fixed mass in proximal sigmoid colon just proximal to the obvious tattooing.  I scored the base of peritoneum of the right side of the mesentery of the left colon from the ligament of Treitz to the peritoneal reflection of the mid rectum.  I could see and stayed away from the right ureter.   I elevated the sigmoid mesentery and entered into the retro-mesenteric plane. We were able to identify the left ureter and gonadal vessels. We kept those posterior within the retroperitoneum and elevated the left colon mesentery off that. I did isolated IMA pedicle but did not ligate it yet.  I continued distally and got into the avascular plane posterior to the mesorectum. This allowed me to help mobilize the rectum as well by freeing the mesorectum off the sacrum.  I mobilized the peritoneal coverings towards the peritoneal reflection on both the right and left sides of the rectum.  I could see the right and left ureters and stayed away from them.  I kept the lateral vascular pedicles to the rectum intact.  I skeletonized the lymph nodes off the inferior mesenteric artery pedicle.  Some more grade from the prior tattoo.   I went down to its takeoff from the aorta.  She had shortened inferior mesenteric artery and vein limiting mobility of the descending colon.  I isolated the inferior mesenteric vein off of the ligament of Treitz just cephalad to that as well.  After confirming the left ureter was out of the way, I went ahead and ligated the inferior mesenteric artery pedicle with bipolar EnSeal just near its takeoff from the aorta.  I did ligate the inferior mesenteric vein in a similar fashion.  We ensured hemostasis. I skeletonized the mesorectum at the junction at the proximal rectum using blunt dissection & bipolar EnSeal.  I mobilized the left colon in a lateral to medial fashion off the line of Toldt up towards the splenic flexure to ensure good mobilization of the left colon to reach  into the pelvis.  I skeletonized and transected the proximal mesorectum at the proximal rectum.  I transected the left colon mesentery radially just proximal to the inferior mesenteric vein pedicle towards the mid descending colon which easily reached down.  I placed a wound protector through a Pfannenstiel incision in the suprapubic region, taking care to avoid bladder injury. I transected bowel at the proximal rectum using a contour stapler. I was able to eviscerate the rectosigmoid and left colon out the wound.  I chose a region at the mid descending colon that was soft and easily reached down, little more proximally than I initially dilated.  I preserved some extra mesial colon to ensure good blood supply without sacrificing reach.   I clamped the colon proximal to this area using a soft bowel clamp. I transected mid-descending colon with a scalpel. I got healthy bleeding mucosa. I transected the remaining specimen mesentery in a radial fashion to preserve good blood supply to the proximal colon end.  We sent the rectosigmoid colon specimen off to go to pathology.  We sized the colon orifice.  I chose a 33 EEA anvil stapler system. I placed the anvil to the open end of the descending colon and closed around it using a 0 Prolene pursestring.  We  did copious irrigation with crystalloid solution.  Hemostasis was good.  The distal end of the colon at the handle easily reached down to the rectal stump, therefore, splenic flexure mobilization was not needed.   Dr Georgette Dover scrubbed down and did gentle anal dilation and advanced the EEA stapler up the rectal stump. The spike was brought out at the provimal end of the rectal stump under direct visualization.  I attached the anvil of the proximal colon the spike of the stapler. Anvil was tightened down and held clamped for 60 seconds. The EEA stapler was fired and held clamped for 30 seconds. The stapler was released & removed. We noted 2 excellent anastomotic rings.  Blue stitch is in the proximal ring.  Dr Georgette Dover did rigid proctoscopy noted the anastomosis was at 16 cm from the anal verge consistent with the proximal rectum.  We did a final irrigation of antibiotic solution (900 mg clindamycin/240 mg gentamicin in a liter of crystalloid) & held that for the pelvic air leak test .  The rectum was insufflated the rectum while clamping the colon proximal to that anastomosis.  There was a negative air leak test. There was no tension of mesentery or bowel at the anastomosis.   Tissues looked viable.    We changed gloves.  We did diagnostic laparoscopy.  We aspirated the antibiotic irrigation.  Hemostasis was good.   Ureters & bowel uninjured.  The anastomosis looked healthy.   Carbon dioxide, ports, and wound protector removed.  We changed gown and gloves.  The patient was re-draped.  Sterile unused instruments were used from this point out per colon SSI prevention protocol.   I closed the 4mm port sites using Monocryl stitch and sterile dressing.  I closed the Pfannenstiel wound using a 0 Vicryl vertical peritoneal closure and 0 PDS transverse anterior rectal fascial closure. I closed the skin with some interrupted Monocryl stitches. I placed antibiotic-soaked wicks into the closure at the corners & centrally x4 between those areas. I placed a sterile dressing.    Patient is being extubated go to recovery room. I discussed postop care with the patient in detail the office & in the holding area. Instructions are written. Apparently family has left but is planning to return later in the day.  I will try later to locate family and discuss it with them as well.  Adin Hector, M.D., F.A.C.S. Gastrointestinal and Minimally Invasive Surgery [facility] [address], [place], [place] [address] [phone] Main / Paging  ADDENDUM:  I updated the status of the patient to the patient's parent when they returned 1 hour later.  I made  recommendations.  I strongly recommended that the parents & her siblings > 79 y/o should consider colonoscopy to r/o polyps/cancers.  I answered questions.  Understanding & appreciation was expressed.  Adin Hector, M.D., F.A.C.S. Gastrointestinal and Minimally Invasive Surgery [facility] [address] [place], [place] [address] [phone] Main / Paging

## 2014-01-28 NOTE — Discharge Instructions (Signed)
ABDOMINAL SURGERY: POST OP INSTRUCTIONS ° °1. DIET: Follow a light bland diet the first 24 hours after arrival home, such as soup, liquids, crackers, etc.  Be sure to include lots of fluids daily.  Avoid fast food or heavy meals as your are more likely to get nauseated.  Eat a low fat the next few days after surgery.   °2. Take your usually prescribed home medications unless otherwise directed. °3. PAIN CONTROL: °a. Pain is best controlled by a usual combination of three different methods TOGETHER: °i. Ice/Heat °ii. Over the counter pain medication °iii. Prescription pain medication °b. Most patients will experience some swelling and bruising around the incisions.  Ice packs or heating pads (30-60 minutes up to 6 times a day) will help. Use ice for the first few days to help decrease swelling and bruising, then switch to heat to help relax tight/sore spots and speed recovery.  Some people prefer to use ice alone, heat alone, alternating between ice & heat.  Experiment to what works for you.  Swelling and bruising can take several weeks to resolve.   °c. It is helpful to take an over-the-counter pain medication regularly for the first few weeks.  Choose one of the following that works best for you: °i. Naproxen (Aleve, etc)  Two 220mg tabs twice a day °ii. Ibuprofen (Advil, etc) Three 200mg tabs four times a day (every meal & bedtime) °iii. Acetaminophen (Tylenol, etc) 500-650mg four times a day (every meal & bedtime) °d. A  prescription for pain medication (such as oxycodone, hydrocodone, etc) should be given to you upon discharge.  Take your pain medication as prescribed.  °i. If you are having problems/concerns with the prescription medicine (does not control pain, nausea, vomiting, rash, itching, etc), please call us [phone] to see if we need to switch you to a different pain medicine that will work better for you and/or control your side effect better. °ii. If you need a refill on your pain medication,  please contact your pharmacy.  They will contact our office to request authorization. Prescriptions will not be filled after 5 pm or on week-ends. °4. Avoid getting constipated.  Between the surgery and the pain medications, it is common to experience some constipation.  Increasing fluid intake and taking a fiber supplement (such as Metamucil, Citrucel, FiberCon, MiraLax, etc) 1-2 times a day regularly will usually help prevent this problem from occurring.  A mild laxative (prune juice, Milk of Magnesia, MiraLax, etc) should be taken according to package directions if there are no bowel movements after 48 hours.   °5. Watch out for diarrhea.  If you have many loose bowel movements, simplify your diet to bland foods & liquids for a few days.  Stop any stool softeners and decrease your fiber supplement.  Switching to mild anti-diarrheal medications (Kayopectate, Pepto Bismol) can help.  If this worsens or does not improve, please call us. °6. Wash / shower every day.  You may shower over the incision / wound.  Avoid baths until the skin is fully healed.  Continue to shower over incision(s) after the dressing is off. °7. Remove your waterproof bandages 5 days after surgery.  You may leave the incision open to air.  You may replace a dressing/Band-Aid to cover the incision for comfort if you wish. °8. ACTIVITIES as tolerated:   °a. You may resume regular (light) daily activities beginning the next day--such as daily self-care, walking, climbing stairs--gradually increasing activities as tolerated.  If you can   walk 30 minutes without difficulty, it is safe to try more intense activity such as jogging, treadmill, bicycling, low-impact aerobics, swimming, etc. b. Save the most intensive and strenuous activity for last such as sit-ups, heavy lifting, contact sports, etc  Refrain from any heavy lifting or straining until you are off narcotics for pain control.   c. DO NOT PUSH THROUGH PAIN.  Let pain be your guide: If it  hurts to do something, don't do it.  Pain is your body warning you to avoid that activity for another week until the pain goes down. d. You may drive when you are no longer taking prescription pain medication, you can comfortably wear a seatbelt, and you can safely maneuver your car and apply brakes. e. Dennis Bast may have sexual intercourse when it is comfortable.  9. FOLLOW UP in our office a. Please call [facility] at [phone] to set up an appointment to see your surgeon in the office for a follow-up appointment approximately 1-2 weeks after your surgery. b. Make sure that you call for this appointment the day you arrive home to insure a convenient appointment time. 10. IF YOU HAVE DISABILITY OR FAMILY LEAVE FORMS, BRING THEM TO THE OFFICE FOR PROCESSING.  DO NOT GIVE THEM TO YOUR DOCTOR.   WHEN TO CALL us [phone]: 1. Poor pain control 2. Reactions / problems with new medications (rash/itching, nausea, etc)  3. Fever over 101.5 F (38.5 C) 4. Inability to urinate 5. Nausea and/or vomiting 6. Worsening swelling or bruising 7. Continued bleeding from incision. 8. Increased pain, redness, or drainage from the incision  The clinic staff is available to answer your questions during regular business hours (8:30am-5pm).  Please dont hesitate to call and ask to speak to one of our nurses for clinical concerns.   A surgeon from [facility] is always on call at the hospitals   If you have a medical emergency, go to the nearest emergency room or call 911.    [facility], [place], [place], [place], [place]  [address] ? MAIN: [phone] ? TOLL FREE: [phone] ? FAX [phone] [email or web address]  GETTING TO GOOD BOWEL HEALTH. Irregular bowel habits such as constipation and diarrhea can lead to many problems over time.  Having one soft bowel movement a day is the most important way to prevent further problems.  The anorectal canal  is designed to handle stretching and feces to safely manage our ability to get rid of solid waste (feces, poop, stool) out of our body.  BUT, hard constipated stools can act like ripping concrete bricks and diarrhea can be a burning fire to this very sensitive area of our body, causing inflamed hemorrhoids, anal fissures, increasing risk is perirectal abscesses, abdominal pain/bloating, an making irritable bowel worse.     The goal: ONE SOFT BOWEL MOVEMENT A DAY!  To have soft, regular bowel movements:   Drink at least 8 tall glasses of water a day.    Take plenty of fiber.  Fiber is the undigested part of plant food that passes into the colon, acting s natures broom to encourage bowel motility and movement.  Fiber can absorb and hold large amounts of water. This results in a larger, bulkier stool, which is soft and easier to pass. Work gradually over several weeks up to 6 servings a day of fiber (25g a day even more if needed) in the form of: o Vegetables -- Root (potatoes, carrots, turnips), leafy green (  lettuce, salad greens, celery, spinach), or cooked high residue (cabbage, broccoli, etc) o Fruit -- Fresh (unpeeled skin & pulp), Dried (prunes, apricots, cherries, etc ),  or stewed ( applesauce)  o Whole grain breads, pasta, etc (whole wheat)  o Bran cereals   Bulking Agents -- This type of water-retaining fiber generally is easily obtained each day by one of the following:  o Psyllium bran -- The psyllium plant is remarkable because its ground seeds can retain so much water. This product is available as Metamucil, Konsyl, Effersyllium, Per Diem Fiber, or the less expensive generic preparation in drug and health food stores. Although labeled a laxative, it really is not a laxative.  o Methylcellulose -- This is another fiber derived from wood which also retains water. It is available as Citrucel. o Polyethylene Glycol - and artificial fiber commonly called Miralax or Glycolax.  It is helpful  for people with gassy or bloated feelings with regular fiber o Flax Seed - a less gassy fiber than psyllium  No reading or other relaxing activity while on the toilet. If bowel movements take longer than 5 minutes, you are too constipated  AVOID CONSTIPATION.  High fiber and water intake usually takes care of this.  Sometimes a laxative is needed to stimulate more frequent bowel movements, but   Laxatives are not a good long-term solution as it can wear the colon out. o Osmotics (Milk of Magnesia, Fleets phosphosoda, Magnesium citrate, MiraLax, GoLytely) are safer than  o Stimulants (Senokot, Castor Oil, Dulcolax, Ex Lax)    o Do not take laxatives for more than 7days in a row.   IF SEVERELY CONSTIPATED, try a Bowel Retraining Program: o Do not use laxatives.  o Eat a diet high in roughage, such as bran cereals and leafy vegetables.  o Drink six (6) ounces of prune or apricot juice each morning.  o Eat two (2) large servings of stewed fruit each day.  o Take one (1) heaping tablespoon of a psyllium-based bulking agent twice a day. Use sugar-free sweetener when possible to avoid excessive calories.  o Eat a normal breakfast.  o Set aside 15 minutes after breakfast to sit on the toilet, but do not strain to have a bowel movement.  o If you do not have a bowel movement by the third day, use an enema and repeat the above steps.   Controlling diarrhea o Switch to liquids and simpler foods for a few days to avoid stressing your intestines further. o Avoid dairy products (especially milk & ice cream) for a short time.  The intestines often can lose the ability to digest lactose when stressed. o Avoid foods that cause gassiness or bloating.  Typical foods include beans and other legumes, cabbage, broccoli, and dairy foods.  Every person has some sensitivity to other foods, so listen to our body and avoid those foods that trigger problems for you. o Adding fiber (Citrucel, Metamucil, psyllium,  Miralax) gradually can help thicken stools by absorbing excess fluid and retrain the intestines to act more normally.  Slowly increase the dose over a few weeks.  Too much fiber too soon can backfire and cause cramping & bloating. o Probiotics (such as active yogurt, Align, etc) may help repopulate the intestines and colon with normal bacteria and calm down a sensitive digestive tract.  Most studies show it to be of mild help, though, and such products can be costly. o Medicines: - Bismuth subsalicylate (ex. Kayopectate, Pepto Bismol) every 30 minutes for  up to 6 doses can help control diarrhea.  Avoid if pregnant. - Loperamide (Immodium) can slow down diarrhea.  Start with two tablets (4mg  total) first and then try one tablet every 6 hours.  Avoid if you are having fevers or severe pain.  If you are not better or start feeling worse, stop all medicines and call your doctor for advice o Call your doctor if you are getting worse or not better.  Sometimes further testing (cultures, endoscopy, X-ray studies, bloodwork, etc) may be needed to help diagnose and treat the cause of the diarrhea.  Managing Pain  Pain after surgery or related to activity is often due to strain/injury to muscle, tendon, nerves and/or incisions.  This pain is usually short-term and will improve in a few months.   Many people find it helpful to do the following things TOGETHER to help speed the process of healing and to get back to regular activity more quickly:  1. Avoid heavy physical activity a.  no lifting greater than 20 pounds b. Do not push through the pain.  Listen to your body and avoid positions and maneuvers than reproduce the pain c. Walking is okay as tolerated, but go slowly and stop when getting sore.  d. Remember: If it hurts to do it, then dont do it! 2. Take Anti-inflammatory medication  a. Take with food/snack around the clock for 1-2 weeks i. This helps the muscle and nerve tissues become less irritable  and calm down faster b. Choose ONE of the following over-the-counter medications: i. Naproxen 220mg  tabs (ex. Aleve) 1-2 pills twice a day  ii. Ibuprofen 200mg  tabs (ex. Advil, Motrin) 3-4 pills with every meal and just before bedtime iii. Acetaminophen 500mg  tabs (Tylenol) 1-2 pills with every meal and just before bedtime 3. Use a Heating pad or Ice/Cold Pack a. 4-6 times a day b. May use warm bath/hottub  or showers 4. Try Gentle Massage and/or Stretching  a. at the area of pain many times a day b. stop if you feel pain - do not overdo it  Try these steps together to help you body heal faster and avoid making things get worse.  Doing just one of these things may not be enough.    If you are not getting better after two weeks or are noticing you are getting worse, contact our office for further advice; we may need to re-evaluate you & see what other things we can do to help.

## 2014-01-28 NOTE — Plan of Care (Signed)
Problem: Consults Goal: General Surgical Patient Education (See Patient Education module for education specifics) Outcome: Completed/Met Date Met:  01/28/14

## 2014-01-28 NOTE — Anesthesia Postprocedure Evaluation (Signed)
  Anesthesia Post-op Note  Patient: Nichole Davis  Procedure(s) Performed: Procedure(s) (LRB): LAPAROSCOPIC SIGMOID COLECTOMY,  (N/A) PROCTOSCOPY (N/A)  Patient Location: PACU  Anesthesia Type: General  Level of Consciousness: awake and alert   Airway and Oxygen Therapy: Patient Spontanous Breathing  Post-op Pain: mild  Post-op Assessment: Post-op Vital signs reviewed, Patient's Cardiovascular Status Stable, Respiratory Function Stable, Patent Airway and No signs of Nausea or vomiting  Last Vitals:  Filed Vitals:   01/28/14 1215  BP: 112/72  Pulse: 64  Temp: 36.5 C  Resp: 16    Post-op Vital Signs: stable   Complications: No apparent anesthesia complications

## 2014-01-28 NOTE — H&P (Signed)
[address] [place], [place] [address] Phone: [phone] FAX: [place]  1985/09/28 [id number]  CARE TEAM:  PCP: No PCP Per Patient  Outpatient Care Team: Patient Care Team: No Pcp Per Patient as PCP - General ([place]) Michael Boston, MD as Consulting Physician (General Surgery) Irene Shipper, MD as Consulting Physician (Gastroenterology) Lyman Speller, MD as Consulting Physician (Gynecology) Juanita Craver, MD as Consulting Physician (Gastroenterology)  Inpatient Treatment Team: Treatment Team: Attending Provider: Michael Boston, MD  This patient is a 28 y.o.female who presents today for surgical evaluation.   Reason for evaluation: Sigmoid colon mass  Pleasant young female. Vague abdominal pain. Felt to be lower. Had ultrasound suspicious for pelvic mass. MRI noted 5 cm pelvic mass. Possibly associated with bowel vs. Ovary. I recommended with radiology for CT enterrhography. Study season mass but now flipped up in left midabdomen. Seems more associated with sigmoid colon. Surgical consultation requested. I recommended colonoscopy. She is due to get that by Dr. Henrene Pastor in a few weeks.  Patient otherwise rather healthy and active. Normally has a bowel movement every day. However now the stools are a little more watery. She has seen blood in the stool. Abdominal discomfort and bloating with eating. That has been a change in the past few months. Normally walks 30-60 minutes without difficulty. Rarely drinks alcohol. No abdominal surgery. No personal nor family history of GI/colon cancer, inflammatory bowel disease, irritable bowel syndrome, allergy such as Celiac Sprue, dietary/dairy problems, colitis, ulcers nor gastritis. No recent sick contacts/gastroenteritis. No travel outside the country. No changes in diet. No dysphagia to solids or liquids. No significant  heartburn or reflux. No hematochezia, hematemesis, coffee ground emesis. No evidence of prior gastric/peptic ulceration.  Patient became pregnant.  Then had an early miscarriage.  Colonoscopy showed polyp in sigmoid colon - biopsy c/w TV adenoma.  Now ready for surgery  Past Medical History  Diagnosis Date  . Anxiety   . GERD (gastroesophageal reflux disease)   . Chest pain     pt. seen at [facility] 07/12/2011, for chest pain, cleared medically, given  Lorazepam & prilosec.  Took Lorazepam & had good relief fr. it, has not taken Prilosec  . PONV (postoperative nausea and vomiting)   . Anemia   . 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 STD (sexually transmitted disease)     trichomonas 8/09, chlamydia 2014  . Vaginal discharge 07/01/2012  . 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 Lymph node biopsy Left 2010    left side neck  . Bone marrow harvest  2005  . Retinal detachment repair w/ scleral buckle le Right 07/17/11    eye  . Scleral buckle  07/17/2011    Procedure: SCLERAL BUCKLE;  Surgeon: Hayden Pedro, MD;  Location: [facility];  Service: Ophthalmology;  Laterality: Right;  . Retinal tear repair cryotherapy Left   . Implanon removal Left     removed 2011, arm  . Dilation and evacuation N/A 11/30/2013    Procedure: DILATATION AND EVACUATION;  Surgeon: Lyman Speller, MD;  Location: [facility];  Service: Gynecology;  Laterality: N/A;    History   Social History  . Marital Status:  Single    Spouse Name: N/A    Number of Children: 0  . Years of Education: N/A   Occupational History  . customer service    Social History Main Topics  . Smoking status: Never Smoker   . Smokeless tobacco: Never Used  . Alcohol Use: Yes     Comment: socially  . Drug Use: No  . Sexual Activity:    Partners: Male    Birth Control/  Protection: Condom   Other Topics Concern  . Not on file   Social History Narrative    Family History  Problem Relation Age of Onset  . Anesthesia problems Neg Hx   . Colon cancer Neg Hx   . Esophageal cancer Neg Hx   . Pancreatic cancer Neg Hx   . Rectal cancer Neg Hx   . Stomach cancer Neg Hx   . Leukemia Brother     Current Facility-Administered Medications  Medication Dose Route Frequency Provider Last Rate Last Dose  . cefoTEtan (CEFOTAN) 2 g in dextrose 5 % 50 mL IVPB  2 g Intravenous On Call to OR Michael Boston, MD      . clindamycin (CLEOCIN) 900 mg, gentamicin (GARAMYCIN) 240 mg in sodium chloride 0.9 % 1,000 mL for intraperitoneal lavage   Intraperitoneal To OR Michael Boston, MD         No Known Allergies  ROS: Constitutional:  No fevers, chills, sweats.  Weight stable Eyes:  No vision changes, No discharge HENT:  No sore throats, nasal drainage Lymph: No neck swelling, No bruising easily Pulmonary:  No cough, productive sputum CV: No orthopnea, PND.  No exertional chest/neck/shoulder/arm pain. GI:  No personal nor family history of GI/colon cancer, inflammatory bowel disease, irritable bowel syndrome, allergy such as Celiac Sprue, dietary/dairy problems, colitis, ulcers nor gastritis.  No recent sick contacts/gastroenteritis.  No travel outside the country.  No changes in diet. Renal: No UTIs, No hematuria Genital:  No drainage, bleeding, masses Musculoskeletal: No severe joint pain.  Good ROM major joints Skin:  No sores or lesions.  No rashes Heme/Lymph:  No easy bleeding.  No swollen lymph nodes Neuro: No focal weakness/numbness.  No seizures Psych: No suicidal ideation.  No hallucinations  BP 105/66 mmHg  Pulse 67  Temp(Src) 98 F (36.7 C) (Oral)  Resp 16  Ht 5\' 1"  (1.549 m)  Wt 142 lb (64.411 kg)  BMI 26.84 kg/m2  SpO2 99%  LMP 01/25/2014  Physical Exam: General: Pt awake/alert/oriented x4 in no major acute distress Eyes: PERRL, normal EOM. Sclera  nonicteric Neuro: CN II-XII intact w/o focal sensory/motor deficits. Lymph: No head/neck/groin lymphadenopathy Psych:  No delerium/psychosis/paranoia HENT: Normocephalic, Mucus membranes moist.  No thrush Neck: Supple, No tracheal deviation Chest: No pain.  Good respiratory excursion. CV:  Pulses intact.  Regular rhythm Abdomen: Soft, Nondistended.  Nontender.  No incarcerated hernias. Ext:  SCDs BLE.  No significant edema.  No cyanosis Skin: No petechiae / purpurea.  No major sores Musculoskeletal: No severe joint pain.  Good ROM major joints   Results:   Labs: Results for orders placed or performed during the hospital encounter of 01/28/14 (from the past 48 hour(s))  Type and screen     Status: None (Preliminary result)   Collection Time: 01/28/14  6:45 AM  Result Value Ref Range   ABO/RH(D) A POS    Antibody Screen PENDING    Sample Expiration 01/31/2014     Imaging / Studies: No results found.  Medications / Allergies:  per chart  Antibiotics: Anti-infectives    Start     Dose/Rate Route Frequency Ordered Stop   01/28/14 0614  cefoTEtan (CEFOTAN) 2 g in dextrose 5 % 50 mL IVPB     2 g100 mL/hr over 30 Minutes Intravenous On call to O.R. 01/28/14 7510 01/29/14 0559   01/28/14 0500  clindamycin (CLEOCIN) 900 mg, gentamicin (GARAMYCIN) 240 mg in sodium chloride 0.9 % 1,000 mL for intraperitoneal lavage    Comments:  Pharmacy may adjust dosing strength, schedule, rate of infusion, etc as needed to optimize therapy    Intraperitoneal To Surgery 01/27/14 1539 01/29/14 0500      Assessment  Noya Jannifer Franklin  28 y.o. female  Day of Surgery  Procedure(s): LAPAROSCOPIC SIGMOID COLECTOMY,  PROCTOSCOPY  Problem List:  Principal Problem:   Adenomatous polyp of colon   Large polyp in sigmoid colon.  Biopsy consistent with tubulovillous adenoma.  Plan:  Minimally invasive sigmoid colectomy:  The anatomy & physiology of the digestive tract was discussed.  The  pathophysiology of the colon was discussed.  Natural history risks without surgery was discussed.   I feel the risks of no intervention will lead to serious problems that outweigh the operative risks; therefore, I recommended a partial colectomy to remove the pathology.  Minimally invasive (Robotic/Laparoscopic) & open techniques were discussed.   Risks such as bleeding, infection, abscess, leak, reoperation, possible ostomy, hernia, heart attack, stroke, death, and other risks were discussed.  I noted a good likelihood this will help address the problem.   Goals of post-operative recovery were discussed as well.   Need for adequate nutrition, daily bowel regimen and healthy physical activity, to optimize recovery was noted as well. We will work to minimize complications.  Educational materials were available as well.  Questions were answered.  The patient expresses understanding & wishes to proceed with surgery.   -VTE prophylaxis- SCDs, etc -mobilize as tolerated to help recovery    Adin Hector, M.D., F.A.C.S. Gastrointestinal and Minimally Invasive Surgery [facility] [address] [place], [place] [address] [phone] Main / Paging   01/28/2014  Note: Portions of this report may have been transcribed using voice recognition software. Every effort was made to ensure accuracy; however, inadvertent computerized transcription errors may be present.   Any transcriptional errors that result from this process are unintentional.

## 2014-01-28 NOTE — Plan of Care (Signed)
Problem: Phase I Progression Outcomes Goal: Pain controlled with appropriate interventions Outcome: Progressing     

## 2014-01-28 NOTE — Transfer of Care (Signed)
Immediate Anesthesia Transfer of Care Note  Patient: Nichole Davis  Procedure(s) Performed: Procedure(s): LAPAROSCOPIC SIGMOID COLECTOMY,  (N/A) PROCTOSCOPY (N/A)  Patient Location: PACU  Anesthesia Type:General  Level of Consciousness: awake, sedated and patient cooperative  Airway & Oxygen Therapy: Patient Spontanous Breathing and Patient connected to face mask oxygen  Post-op Assessment: Report given to PACU RN and Post -op Vital signs reviewed and stable  Post vital signs: Reviewed and stable  Complications: No apparent anesthesia complications

## 2014-01-29 ENCOUNTER — Encounter (HOSPITAL_COMMUNITY): Payer: Self-pay | Admitting: Surgery

## 2014-01-29 LAB — BASIC METABOLIC PANEL
ANION GAP: 9 (ref 5–15)
BUN: 6 mg/dL (ref 6–23)
CALCIUM: 8.4 mg/dL (ref 8.4–10.5)
CHLORIDE: 109 meq/L (ref 96–112)
CO2: 24 meq/L (ref 19–32)
Creatinine, Ser: 0.64 mg/dL (ref 0.50–1.10)
GFR calc Af Amer: >90 mL/min (ref >90)
GFR calc non Af Amer: >90 mL/min (ref >90)
Glucose, Bld: 96 mg/dL (ref 70–99)
POTASSIUM: 3.6 meq/L — AB (ref 3.7–5.3)
SODIUM: 142 meq/L (ref 137–147)

## 2014-01-29 LAB — CBC
HEMATOCRIT: 28.3 % — AB (ref 36.0–46.0)
Hemoglobin: 9.1 g/dL — ABNORMAL LOW (ref 12.0–15.0)
MCH: 25.6 pg — ABNORMAL LOW (ref 26.0–34.0)
MCHC: 32.2 g/dL (ref 30.0–36.0)
MCV: 79.5 fL (ref 78.0–100.0)
Platelets: 231 10*3/uL (ref 150–400)
RBC: 3.56 MIL/uL — AB (ref 3.87–5.11)
RDW: 15.7 % — AB (ref 11.5–15.5)
WBC: 10.9 10*3/uL — AB (ref 4.0–10.5)

## 2014-01-29 LAB — MAGNESIUM: Magnesium: 1.9 mg/dL (ref 1.5–2.5)

## 2014-01-29 NOTE — Progress Notes (Signed)
[facility]  [address] [place], [place] [address] Phone: [phone] FAX: [place] [phone] 1985-07-22  CARE TEAM:  PCP: No PCP Per Patient  Outpatient Care Team: Patient Care Team: No Pcp Per Patient as PCP - General ([place]) Michael Boston, MD as Consulting Physician (General Surgery) Irene Shipper, MD as Consulting Physician (Gastroenterology) Lyman Speller, MD as Consulting Physician (Gynecology) Juanita Craver, MD as Consulting Physician (Gastroenterology)  Inpatient Treatment Team: Treatment Team: Attending Provider: Michael Boston, MD; Registered Nurse: Claretta Fraise, RN   Subjective:  Pain controlled Tol full liquids Sat up only Mother in room  Objective:  Vital signs:  Filed Vitals:   01/28/14 1800 01/28/14 2158 01/29/14 0200 01/29/14 0546  BP: 112/72 117/56 104/59 103/53  Pulse: 70 79 89 74  Temp: 98.6 F (37 C) 98.8 F (37.1 C) 98.6 F (37 C) 98.9 F (37.2 C)  TempSrc:  Oral Oral Oral  Resp:  _0 Height:      Weight:      SpO2: 100% 99% 98% 97%    Last BM Date: 01/27/14  Intake/Output   Yesterday:  11/12 0701 - 11/13 0700 In: 4828.8 [P.O.:1080; I.V.:3698.8; IV Piggyback:50] Out: 3800 [Urine:3700; Blood:100] This shift:  Total I/O In: 1966.3 [P.O.:1080; I.V.:886.3] Out: 1450 [Urine:1450]  Bowel function:  Flatus: n  BM: n  Drain: n/a  Physical Exam:  General: Pt awake/alert/oriented x4 in no acute distress Eyes: PERRL, normal EOM.  Sclera clear.  No icterus Neuro: CN II-XII intact w/o focal sensory/motor deficits. Lymph: No head/neck/groin lymphadenopathy Psych:  No delerium/psychosis/paranoia HENT: Normocephalic, Mucus membranes moist.  No thrush Neck: Supple, No tracheal deviation Chest: No chest wall pain w good excursion CV:  Pulses intact.  Regular rhythm MS: Normal AROM mjr joints.  No obvious deformity Abdomen: Soft.   Nondistended.  Mildly tender at incisions only.  No evidence of peritonitis.  No incarcerated hernias. Ext:  SCDs BLE.  No mjr edema.  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 female  1 Day Post-Op  Procedure(s): LAPAROSCOPIC SIGMOID COLECTOMY,  PROCTOSCOPY  Stable  Plan:  -adv diet per protocol -f/u path -VTE prophylaxis- SCDs, etc -mobilize as tolerated to help recovery - GET HER UP!!.  D/w RNs  I updated the patient's status to the patient & her mother.  Recommendations were made, especially that her siblings & parents should consider colonoscopy.  Questions were answered.  The patient expressed understanding & appreciation.   Adin Hector, M.D., F.A.C.S. Gastrointestinal and Minimally Invasive Surgery [facility] [address], [place], [place] [address] [phone] Main / Paging   01/29/2014   Results:   Labs: Results for orders placed or performed during the hospital encounter of 01/28/14 (from the past 48 hour(s))  Type and screen     Status: None   Collection Time: 01/28/14  6:45 AM  Result Value Ref Range   ABO/RH(D) A POS    Antibody Screen NEG    Sample Expiration 95/18/8416   Basic metabolic panel     Status: Abnormal   Collection Time: 01/29/14  4:52 AM  Result Value Ref Range   Sodium 142 137 - 147 mEq/L   Potassium 3.6 (L) 3.7 - 5.3 mEq/L  Chloride 109 96 - 112 mEq/L   CO2 24 19 - 32 mEq/L   Glucose, Bld 96 70 - 99 mg/dL   BUN 6 6 - 23 mg/dL   Creatinine, Ser 0.64 0.50 - 1.10 mg/dL   Calcium 8.4 8.4 - 10.5 mg/dL   GFR calc non Af Amer >90 >90 mL/min   GFR calc Af Amer >90 >90 mL/min    Comment: (NOTE) The eGFR has been calculated using the CKD EPI equation. This calculation has not been validated in all clinical situations. eGFR's persistently <90 mL/min signify  possible Chronic Kidney Disease.    Anion gap 9 5 - 15  CBC     Status: Abnormal   Collection Time: 01/29/14  4:52 AM  Result Value Ref Range   WBC 10.9 (H) 4.0 - 10.5 K/uL   RBC 3.56 (L) 3.87 - 5.11 MIL/uL   Hemoglobin 9.1 (L) 12.0 - 15.0 g/dL   HCT 28.3 (L) 36.0 - 46.0 %   MCV 79.5 78.0 - 100.0 fL   MCH 25.6 (L) 26.0 - 34.0 pg   MCHC 32.2 30.0 - 36.0 g/dL   RDW 15.7 (H) 11.5 - 15.5 %   Platelets 231 150 - 400 K/uL  Magnesium     Status: None   Collection Time: 01/29/14  4:52 AM  Result Value Ref Range   Magnesium 1.9 1.5 - 2.5 mg/dL    Imaging / Studies: No results found.  Medications / Allergies: per chart  Antibiotics: Anti-infectives    Start     Dose/Rate Route Frequency Ordered Stop   01/28/14 2000  cefoTEtan (CEFOTAN) 2 g in dextrose 5 % 50 mL IVPB     2 g100 mL/hr over 30 Minutes Intravenous Every 12 hours 01/28/14 1229 01/28/14 2129   01/28/14 0614  cefoTEtan (CEFOTAN) 2 g in dextrose 5 % 50 mL IVPB     2 g100 mL/hr over 30 Minutes Intravenous On call to O.R. 01/28/14 3582 01/28/14 0727   01/28/14 0500  clindamycin (CLEOCIN) 900 mg, gentamicin (GARAMYCIN) 240 mg in sodium chloride 0.9 % 1,000 mL for intraperitoneal lavage  Status:  Discontinued    Comments:  Pharmacy may adjust dosing strength, schedule, rate of infusion, etc as needed to optimize therapy    Intraperitoneal To Surgery 01/27/14 1539 01/28/14 1215       Note: Portions of this report may have been transcribed using voice recognition software. Every effort was made to ensure accuracy; however, inadvertent computerized transcription errors may be present.   Any transcriptional errors that result from this process are unintentional.

## 2014-01-29 NOTE — Plan of Care (Signed)
Problem: Phase I Progression Outcomes Goal: Vital signs/hemodynamically stable Outcome: Completed/Met Date Met:  01/29/14

## 2014-01-29 NOTE — Care Management Note (Signed)
    Page 1 of 1   01/29/2014     11:08:43 AM CARE MANAGEMENT NOTE 01/29/2014  Patient:  Nichole Davis, Nichole Davis   Account Number:  [id number]  Date Initiated:  01/29/2014  Documentation initiated by:  Sunday Spillers  Subjective/Objective Assessment:   28 yo female admitted s/p colectomy. PTA lived at home with father.     Action/Plan:   Home when stable   Anticipated DC Date:  02/01/2014   Anticipated DC Plan:  [place]  CM consult      Choice offered to / List presented to:             Status of service:  Completed, signed off Medicare Important Message given?   (If response is "NO", the following Medicare IM given date fields will be blank) Date Medicare IM given:   Medicare IM given by:   Date Additional Medicare IM given:   Additional Medicare IM given by:    Discharge Disposition:  HOME/SELF CARE  Per UR Regulation:  Reviewed for med. necessity/level of care/duration of stay  If discussed at [place] of Stay Meetings, dates discussed:    Comments:

## 2014-01-29 NOTE — Plan of Care (Signed)
Problem: Phase I Progression Outcomes Goal: Tubes/drains patent Outcome: Completed/Met Date Met:  01/29/14

## 2014-01-29 NOTE — Plan of Care (Signed)
Problem: Phase I Progression Outcomes Goal: Pain controlled with appropriate interventions Outcome: Completed/Met Date Met:  01/29/14 Goal: OOB as tolerated unless otherwise ordered Outcome: Completed/Met Date Met:  01/29/14 Goal: Incision/dressings dry and intact Outcome: Completed/Met Date Met:  01/29/14 Goal: Sutures/staples intact Outcome: Not Applicable Date Met:  92/17/83 Goal: Initial discharge plan identified Outcome: Completed/Met Date Met:  01/29/14 Goal: Voiding-avoid urinary catheter unless indicated Outcome: Progressing

## 2014-01-30 MED ORDER — OXYCODONE HCL 5 MG PO TABS
10.0000 mg | ORAL_TABLET | ORAL | Status: DC | PRN
  Administered 2014-01-30: 10 mg via ORAL
  Filled 2014-01-30: qty 2

## 2014-01-30 MED ORDER — INFLUENZA VAC SPLIT QUAD 0.5 ML IM SUSY
0.5000 mL | PREFILLED_SYRINGE | INTRAMUSCULAR | Status: AC
  Administered 2014-01-31: 0.5 mL via INTRAMUSCULAR
  Filled 2014-01-30 (×2): qty 0.5

## 2014-01-30 NOTE — Plan of Care (Signed)
Problem: Phase II Progression Outcomes Goal: Progress activity as tolerated unless otherwise ordered Outcome: Completed/Met Date Met:  01/30/14     

## 2014-01-30 NOTE — Progress Notes (Signed)
2 Days Post-Op  Subjective: Passing flatus and stool, doing well with fulls, ambulating now, voiding, pain controlled  Objective: Vital signs in last 24 hours: Temp:  [98.1 F (36.7 C)-98.5 F (36.9 C)] 98.1 F (36.7 C) (11/14 0605) Pulse Rate:  [18-80] 18 (11/14 0605) Resp:  [15-16] 16 (11/14 0605) BP: (99-117)/(57-71) 112/57 mmHg (11/14 0605) SpO2:  [99 %-100 %] 100 % (11/14 0605) Weight:  [141 lb 8.6 oz (64.2 kg)] 141 lb 8.6 oz (64.2 kg) (11/14 0530) Last BM Date: 01/27/14  Intake/Output from previous day: 11/13 0701 - 11/14 0700 In: 3013.8 [P.O.:1200; I.V.:1813.8] Out: 2650 [Urine:2400; Emesis/NG output:250] Intake/Output this shift: Total I/O In: -  Out: 1 [Stool:1]  General appearance: no distress Resp: clear to auscultation bilaterally Cardio: regular rate and rhythm GI: incisions clean, soft bs present approp tender  Lab Results:   Recent Labs  01/29/14 0452  WBC 10.9*  HGB 9.1*  HCT 28.3*  PLT 231   BMET  Recent Labs  01/29/14 0452  NA 142  K 3.6*  CL 109  CO2 24  GLUCOSE 96  BUN 6  CREATININE 0.64  CALCIUM 8.4   PT/INR No results for input(s): LABPROT, INR in the last 72 hours. ABG No results for input(s): PHART, HCO3 in the last 72 hours.  Invalid input(s): PCO2, PO2  Studies/Results: No results found.  Anti-infectives: Anti-infectives    Start     Dose/Rate Route Frequency Ordered Stop   01/28/14 2000  cefoTEtan (CEFOTAN) 2 g in dextrose 5 % 50 mL IVPB     2 g100 mL/hr over 30 Minutes Intravenous Every 12 hours 01/28/14 1229 01/28/14 2129   01/28/14 0614  cefoTEtan (CEFOTAN) 2 g in dextrose 5 % 50 mL IVPB     2 g100 mL/hr over 30 Minutes Intravenous On call to O.R. 01/28/14 2633 01/28/14 0727   01/28/14 0500  clindamycin (CLEOCIN) 900 mg, gentamicin (GARAMYCIN) 240 mg in sodium chloride 0.9 % 1,000 mL for intraperitoneal lavage  Status:  Discontinued    Comments:  Pharmacy may adjust dosing strength, schedule, rate of infusion, etc  as needed to optimize therapy    Intraperitoneal To Surgery 01/27/14 1539 01/28/14 1215      Assessment/Plan: POD 2 lap sigmoid  1. Oral pain meds, tylenol 2. pulm toilet, oob 3. Advance diet 4. Path discussed 5. Heparin, scds 6. Possibly home tomorrow  [facility] 01/30/2014

## 2014-01-30 NOTE — Plan of Care (Signed)
Problem: Phase II Progression Outcomes Goal: Pain controlled Outcome: Completed/Met Date Met:  01/30/14

## 2014-01-30 NOTE — Plan of Care (Signed)
Problem: Phase II Progression Outcomes Goal: Vital signs stable Outcome: Completed/Met Date Met:  01/30/14     

## 2014-01-30 NOTE — Plan of Care (Signed)
Problem: Phase I Progression Outcomes Goal: Voiding-avoid urinary catheter unless indicated Outcome: Completed/Met Date Met:  01/30/14 Goal: Other Phase I Outcomes/Goals Outcome: Completed/Met Date Met:  01/30/14  Problem: Phase II Progression Outcomes Goal: Progressing with IS, TCDB Outcome: Completed/Met Date Met:  01/30/14 Goal: Surgical site without signs of infection Outcome: Completed/Met Date Met:  01/30/14 Goal: Dressings dry/intact Outcome: Completed/Met Date Met:  01/30/14 Goal: Sutures/staples intact Outcome: Completed/Met Date Met:  01/30/14 Goal: Return of bowel function (flatus, BM) IF ABDOMINAL SURGERY:  Outcome: Completed/Met Date Met:  01/30/14 Goal: Foley discontinued Outcome: Not Applicable Date Met:  44/97/53 Goal: Discharge plan established Outcome: Completed/Met Date Met:  01/30/14 Goal: Tolerating diet Outcome: Completed/Met Date Met:  01/30/14 Goal: Other Phase II Outcomes/Goals Outcome: Completed/Met Date Met:  01/30/14

## 2014-01-31 MED ORDER — OXYCODONE HCL 5 MG PO TABS: 5.0000 mg | ORAL_TABLET | Freq: Four times a day (QID) | ORAL | Status: DC | PRN

## 2014-01-31 NOTE — Plan of Care (Signed)
Problem: Consults Goal: Skin Care Protocol Initiated - if Braden Score 18 or less If consults are not indicated, leave blank or document N/A  Outcome: Not Applicable Date Met:  72/76/18 Goal: Nutrition Consult-if indicated Outcome: Not Applicable Date Met:  48/59/27 Goal: Diabetes Guidelines if Diabetic/Glucose > 140 If diabetic or lab glucose is > 140 mg/dl - Initiate Diabetes/Hyperglycemia Guidelines & Document Interventions  Outcome: Not Applicable Date Met:  63/94/32

## 2014-01-31 NOTE — Plan of Care (Signed)
Problem: Phase III Progression Outcomes Goal: Pain controlled on oral analgesia Outcome: Completed/Met Date Met:  01/31/14 Goal: Activity at appropriate level-compared to baseline (UP IN CHAIR FOR HEMODIALYSIS)  Outcome: Completed/Met Date Met:  01/31/14 Goal: Voiding independently Outcome: Completed/Met Date Met:  01/31/14 Goal: Nasogastric tube discontinued Outcome: Not Applicable Date Met:  30/14/15

## 2014-01-31 NOTE — Progress Notes (Signed)
General Surgery Note  LOS: 3 days  POD -  3 Days Post-Op  Assessment/Plan: 1.  LAPAROSCOPIC SIGMOID COLECTOMY, PROCTOSCOPY - 01/28/2014 - [place]  Path - tubovillous adenoma  Ready to go home.  2. 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 Tolerating po's.  Objective:   Filed Vitals:   01/31/14 0525  BP: 101/61  Pulse: 55  Temp: 98.2 F (36.8 C)  Resp: 16     Intake/Output from previous day:  11/14 0701 - 11/15 0700 In: 787.8 [P.O.:360; I.V.:427.8] Out: 9 [Urine:5; Stool:4]  Intake/Output this shift:      Physical Exam:   General: WN F who is alert and oriented.    HEENT: Normal. Pupils equal. .   Lungs: Clear   Abdomen: Soft   Wound: Clean      Lab Results:    Recent Labs  01/29/14 0452  WBC 10.9*  HGB 9.1*  HCT 28.3*  PLT 231    BMET   Recent Labs  01/29/14 0452  NA 142  K 3.6*  CL 109  CO2 24  GLUCOSE 96  BUN 6  CREATININE 0.64  CALCIUM 8.4    PT/INR  No results for input(s): LABPROT, INR in the last 72 hours.  ABG  No results for input(s): PHART, HCO3 in the last 72 hours.  Invalid input(s): PCO2, PO2   Studies/Results:  No results found.   Anti-infectives:   Anti-infectives    Start     Dose/Rate Route Frequency Ordered Stop   01/28/14 2000  cefoTEtan (CEFOTAN) 2 g in dextrose 5 % 50 mL IVPB     2 g100 mL/hr over 30 Minutes Intravenous Every 12 hours 01/28/14 1229 01/28/14 2129   01/28/14 0614  cefoTEtan (CEFOTAN) 2 g in dextrose 5 % 50 mL IVPB     2 g100 mL/hr over 30 Minutes Intravenous On call to O.R. 01/28/14 2707 01/28/14 0727   01/28/14 0500  clindamycin (CLEOCIN) 900 mg, gentamicin (GARAMYCIN) 240 mg in sodium chloride 0.9 % 1,000 mL for intraperitoneal lavage  Status:  Discontinued    Comments:  Pharmacy may adjust dosing strength, schedule, rate of infusion, etc as needed to optimize therapy    Intraperitoneal To Surgery  01/27/14 1539 01/28/14 1215      Alphonsa Overall, MD, FACS Pager: [facility] Office: [phone] 01/31/2014

## 2014-01-31 NOTE — Plan of Care (Signed)
Problem: Phase III Progression Outcomes Goal: Demonstrates TCDB, IS independently Outcome: Completed/Met Date Met:  01/31/14 Goal: Other Phase III Outcomes/Goals Outcome: Completed/Met Date Met:  01/31/14

## 2014-01-31 NOTE — Progress Notes (Signed)
Assessment unchanged. Pt and mother verbalized understanding of dc instructions through teach back. Scripts x 1 given as provided by physician. Discharged via wc to front entrance to meet awaiting vehicle to carry home. Accompanied by mother and NT.

## 2014-01-31 NOTE — Discharge Summary (Addendum)
Physician Discharge Summary  Patient ID:  Nichole Davis  MRN: [id number]  DOB/AGE: 1986-02-13 28 y.o.  Admit date: 01/28/2014 Discharge date: 01/31/2014  Discharge Diagnoses:  1.  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 female whose primary care physician is No PCP Per Patient and who was admitted 01/28/2014 with a chief complaint of tumor of sigmoid colon.   She was brought to the operating room on 01/28/2014 and underwent  LAPAROSCOPIC SIGMOID COLECTOMY, PROCTOSCOPY.  She has done very well post op. She is now ready to go home.  Her boyfriend, Maylon Cos, is in the room, though he slept the whole time.  The discharge instructions were reviewed with the patient.  Consults: None  Significant Diagnostic Studies: Results for orders placed or performed during the hospital encounter of 41/96/22  Basic metabolic panel  Result Value Ref Range   Sodium 142 137 - 147 mEq/L   Potassium 3.6 (L) 3.7 - 5.3 mEq/L   Chloride 109 96 - 112 mEq/L   CO2 24 19 - 32 mEq/L   Glucose, Bld 96 70 - 99 mg/dL   BUN 6 6 - 23 mg/dL   Creatinine, Ser 0.64 0.50 - 1.10 mg/dL   Calcium 8.4 8.4 - 10.5 mg/dL   GFR calc non Af Amer >90 >90 mL/min   GFR calc Af Amer >90 >90 mL/min   Anion gap 9 5 - 15  CBC  Result Value Ref Range   WBC 10.9 (H) 4.0 - 10.5 K/uL   RBC 3.56 (L) 3.87 - 5.11 MIL/uL   Hemoglobin 9.1 (L) 12.0 - 15.0 g/dL   HCT 28.3 (L) 36.0 - 46.0 %   MCV 79.5 78.0 - 100.0 fL   MCH 25.6 (L) 26.0 - 34.0 pg   MCHC 32.2 30.0 - 36.0 g/dL   RDW 15.7 (H) 11.5 - 15.5 %   Platelets 231 150 - 400 K/uL  Magnesium  Result Value Ref Range   Magnesium 1.9 1.5 - 2.5 mg/dL  Type and screen  Result Value Ref Range   ABO/RH(D) A POS    Antibody Screen NEG    Sample Expiration 01/31/2014     No results found.  Discharge Exam:  Filed Vitals:   01/31/14 0525  BP: 101/61  Pulse: 55  Temp: 98.2 F (36.8 C)  Resp: 16    General: WN Hispanic female who is alert and generally healthy appearing.  Lungs: Clear to auscultation and symmetric breath sounds. Heart:  RRR. No murmur or rub. Abdomen: Soft. No mass.  Normal bowel sounds. Incisions and dressing look okay.   Discharge Medications:     Medication List    TAKE these medications        norethindrone-ethinyl estradiol 1-20 MG-MCG tablet  Commonly known as:  JUNEL FE,GILDESS FE,LOESTRIN FE  Take 1 tablet by mouth daily.     oxyCODONE 5 MG immediate release tablet  Commonly known as:  Oxy IR/ROXICODONE  Take 1-2 tablets (5-10 mg total) by mouth every 6 (six) hours as needed for moderate pain, severe pain or breakthrough pain.        Disposition: 01-Home or Self Care      Discharge Instructions    Call MD for:  extreme fatigue    Complete by:  As directed  Call MD for:  hives    Complete by:  As directed      Call MD for:  persistant nausea and vomiting    Complete by:  As directed      Call MD for:  redness, tenderness, or signs of infection (pain, swelling, redness, odor or green/yellow discharge around incision site)    Complete by:  As directed      Call MD for:  severe uncontrolled pain    Complete by:  As directed      Call MD for:    Complete by:  As directed   Temperature > 101.4F     Diet - low sodium heart healthy    Complete by:  As directed      Diet - low sodium heart healthy    Complete by:  As directed      Discharge instructions    Complete by:  As directed   Please see discharge instruction sheets.  Also refer to handout given an office.  Please call our office if you have any questions or concerns [phone]     Discharge wound care:    Complete by:  As directed   If you have closed incisions, shower and bathe over these incisions with soap  and water every day.  Remove all surgical dressings on postoperative day #3.  You do not need to replace dressings over the closed incisions unless you feel more comfortable with a Band-Aid covering it.   If you have an open wound that requires packing, please see wound care instructions.  In general, remove all dressings, wash wound with soap and water and then replace with saline moistened gauze.  Do the dressing change at least every day.  Please call our office [phone] if you have further questions.     Driving Restrictions    Complete by:  As directed   No driving until off narcotics and can safely swerve away without pain during an emergency     Increase activity slowly    Complete by:  As directed   Walk an hour a day.  Use 20-30 minute walks.  When you can walk 30 minutes without difficulty, increase to low impact/moderate activities such as biking, jogging, swimming, sexual activity..  Eventually can increase to unrestricted activity when not feeling pain.  If you feel pain: STOP!Marland Kitchen   Let pain protect you from overdoing it.  Use ice/heat/over-the-counter pain medications to help minimize his soreness.  Use pain prescriptions as needed to remain active.  It is better to take extra pain medications and be more active than to stay bedridden to avoid all pain medications.     Increase activity slowly    Complete by:  As directed      Lifting restrictions    Complete by:  As directed   Avoid heavy lifting initially.  Do not push through pain.  You have no specific weight limit.  Coughing and sneezing or four more stressful to your incision than any lifting you will do. Pain will protect you from injury.  Therefore, avoid intense activity until off all narcotic pain medications.  Coughing and sneezing or four more stressful to your incision than any lifting he will do.     May shower / Bathe    Complete by:  As directed      May walk up steps    Complete by:  As directed      Sexual  Activity Restrictions  Complete by:  As directed   Sexual activity as tolerated.  Do not push through pain.  Pain will protect you from injury.     Walk with assistance    Complete by:  As directed   Walk over an hour a day.  May use a walker/cane/companion to help with balance and stamina.           Follow-up Information    Follow up with GROSS,STEVEN C., MD In 2 weeks.   Specialty:  General Surgery   Why:  To follow up after your operation, To follow up after your hospital stay   Contact information:   [address] [place] [address] [phone]        Signed: Alphonsa Overall, M.D., [facility] Surgery Office:  [phone]  01/31/2014, 9:05 AM

## 2014-02-01 NOTE — Progress Notes (Signed)
Discharge summary sent to payer through [facility]  

## 2014-03-15 ENCOUNTER — Encounter: Payer: Self-pay | Admitting: Nurse Practitioner

## 2014-03-15 ENCOUNTER — Ambulatory Visit (INDEPENDENT_AMBULATORY_CARE_PROVIDER_SITE_OTHER): Payer: BC Managed Care – PPO | Admitting: Nurse Practitioner

## 2014-03-15 VITALS — BP 92/60 | HR 72 | Temp 98.1°F | Resp 18 | Ht 61.0 in | Wt 140.0 lb

## 2014-03-15 DIAGNOSIS — N3001 Acute cystitis with hematuria: Secondary | ICD-10-CM

## 2014-03-15 DIAGNOSIS — Z113 Encounter for screening for infections with a predominantly sexual mode of transmission: Secondary | ICD-10-CM

## 2014-03-15 DIAGNOSIS — R300 Dysuria: Secondary | ICD-10-CM

## 2014-03-15 DIAGNOSIS — N939 Abnormal uterine and vaginal bleeding, unspecified: Secondary | ICD-10-CM

## 2014-03-15 LAB — POCT URINALYSIS DIPSTICK
Bilirubin, UA: NEGATIVE
GLUCOSE UA: NEGATIVE
Ketones, UA: NEGATIVE
Nitrite, UA: NEGATIVE
Protein, UA: NEGATIVE
UROBILINOGEN UA: NEGATIVE
pH, UA: 5

## 2014-03-15 LAB — POCT URINE PREGNANCY: Preg Test, Ur: NEGATIVE

## 2014-03-15 MED ORDER — NITROFURANTOIN MONOHYD MACRO 100 MG PO CAPS: 100.0000 mg | ORAL_CAPSULE | Freq: Two times a day (BID) | ORAL | Status: DC

## 2014-03-15 NOTE — Patient Instructions (Signed)
Dysfunctional Uterine Bleeding Normally, menstrual periods begin between ages 11 to 17 in young women. A normal menstrual cycle/period may begin every 23 days up to 35 days and lasts from 1 to 7 days. Around 12 to 14 days before your menstrual period starts, ovulation (ovary produces an egg) occurs. When counting the time between menstrual periods, count from the first day of bleeding of the previous period to the first day of bleeding of the next period. Dysfunctional (abnormal) uterine bleeding is bleeding that is different from a normal menstrual period. Your periods may come earlier or later than usual. They may be lighter, have blood clots or be heavier. You may have bleeding between periods, or you may skip one period or more. You may have bleeding after sexual intercourse, bleeding after menopause, or no menstrual period. CAUSES   Pregnancy (normal, miscarriage, tubal).  IUDs (intrauterine device, birth control).  Birth control pills.  Hormone treatment.  Menopause.  Infection of the cervix.  Blood clotting problems.  Infection of the inside lining of the uterus.  Endometriosis, inside lining of the uterus growing in the pelvis and other female organs.  Adhesions (scar tissue) inside the uterus.  Obesity or severe weight loss.  Uterine polyps inside the uterus.  Cancer of the vagina, cervix, or uterus.  Ovarian cysts or polycystic ovary syndrome.  Medical problems (diabetes, thyroid disease).  Uterine fibroids (noncancerous tumor).  Problems with your female hormones.  Endometrial hyperplasia, very thick lining and enlarged cells inside of the uterus.  Medicines that interfere with ovulation.  Radiation to the pelvis or abdomen.  Chemotherapy. DIAGNOSIS   Your doctor will discuss the history of your menstrual periods, medicines you are taking, changes in your weight, stress in your life, and any medical problems you may have.  Your doctor will do a physical  and pelvic examination.  Your doctor may want to perform certain tests to make a diagnosis, such as:  Pap test.  Blood tests.  Cultures for infection.  CT scan.  Ultrasound.  Hysteroscopy.  Laparoscopy.  MRI.  Hysterosalpingography.  D and C.  Endometrial biopsy. TREATMENT  Treatment will depend on the cause of the dysfunctional uterine bleeding (DUB). Treatment may include:  Observing your menstrual periods for a couple of months.  Prescribing medicines for medical problems, including:  Antibiotics.  Hormones.  Birth control pills.  Removing an IUD (intrauterine device, birth control).  Surgery:  D and C (scrape and remove tissue from inside the uterus).  Laparoscopy (examine inside the abdomen with a lighted tube).  Uterine ablation (destroy lining of the uterus with electrical current, laser, heat, or freezing).  Hysteroscopy (examine cervix and uterus with a lighted tube).  Hysterectomy (remove the uterus). HOME CARE INSTRUCTIONS   If medicines were prescribed, take exactly as directed. Do not change or switch medicines without consulting your caregiver.  Long term heavy bleeding may result in iron deficiency. Your caregiver may have prescribed iron pills. They help replace the iron that your body lost from heavy bleeding. Take exactly as directed.  Do not take aspirin or medicines that contain aspirin one week before or during your menstrual period. Aspirin may make the bleeding worse.  If you need to change your sanitary pad or tampon more than once every 2 hours, stay in bed with your feet elevated and a cold pack on your lower abdomen. Rest as much as possible, until the bleeding stops or slows down.  Eat well-balanced meals. Eat foods high in iron. Examples   are:  Leafy green vegetables.  Whole-grain breads and cereals.  Eggs.  Meat.  Liver.  Do not try to lose weight until the abnormal bleeding has stopped and your blood iron level is  back to normal. Do not lift more than ten pounds or do strenuous activities when you are bleeding.  For a couple of months, make note on your calendar, marking the start and ending of your period, and the type of bleeding (light, medium, heavy, spotting, clots or missed periods). This is for your caregiver to better evaluate your problem. SEEK MEDICAL CARE IF:   You develop nausea (feeling sick to your stomach) and vomiting, dizziness, or diarrhea while you are taking your medicine.  You are getting lightheaded or weak.  You have any problems that may be related to the medicine you are taking.  You develop pain with your DUB.  You want to remove your IUD.  You want to stop or change your birth control pills or hormones.  You have any type of abnormal bleeding mentioned above.  You are over 49 years old and have not had a menstrual period yet.  You are 28 years old and you are still having menstrual periods.  You have any of the symptoms mentioned above.  You develop a rash. SEEK IMMEDIATE MEDICAL CARE IF:   An oral temperature above 102 F (38.9 C) develops.  You develop chills.  You are changing your sanitary pad or tampon more than once an hour.  You develop abdominal pain.  You pass out or faint. Document Released: 03/02/2000 Document Revised: 05/28/2011 Document Reviewed: 02/01/2009 [facility] Patient Information 2015 [place], [place]. This information is not intended to replace advice given to you by your health care provider. Make sure you discuss any questions you have with your health care provider.  Urinary Tract Infection Urinary tract infections (UTIs) can develop anywhere along your urinary tract. Your urinary tract is your body's drainage system for removing wastes and extra water. Your urinary tract includes two kidneys, two ureters, a bladder, and a urethra. Your kidneys are a pair of bean-shaped organs. Each kidney is about the size of your fist. They are  located below your ribs, one on each side of your spine. CAUSES Infections are caused by microbes, which are microscopic organisms, including fungi, viruses, and bacteria. These organisms are so small that they can only be seen through a microscope. Bacteria are the microbes that most commonly cause UTIs. SYMPTOMS  Symptoms of UTIs may vary by age and gender of the patient and by the location of the infection. Symptoms in young women typically include a frequent and intense urge to urinate and a painful, burning feeling in the bladder or urethra during urination. Older women and men are more likely to be tired, shaky, and weak and have muscle aches and abdominal pain. A fever may mean the infection is in your kidneys. Other symptoms of a kidney infection include pain in your back or sides below the ribs, nausea, and vomiting. DIAGNOSIS To diagnose a UTI, your caregiver will ask you about your symptoms. Your caregiver also will ask to provide a urine sample. The urine sample will be tested for bacteria and white blood cells. White blood cells are made by your body to help fight infection. TREATMENT  Typically, UTIs can be treated with medication. Because most UTIs are caused by a bacterial infection, they usually can be treated with the use of antibiotics. The choice of antibiotic and length of treatment  depend on your symptoms and the type of bacteria causing your infection. HOME CARE INSTRUCTIONS  If you were prescribed antibiotics, take them exactly as your caregiver instructs you. Finish the medication even if you feel better after you have only taken some of the medication.  Drink enough water and fluids to keep your urine clear or pale yellow.  Avoid caffeine, tea, and carbonated beverages. They tend to irritate your bladder.  Empty your bladder often. Avoid holding urine for long periods of time.  Empty your bladder before and after sexual intercourse.  After a bowel movement, women should  cleanse from front to back. Use each tissue only once. SEEK MEDICAL CARE IF:   You have back pain.  You develop a fever.  Your symptoms do not begin to resolve within 3 days. SEEK IMMEDIATE MEDICAL CARE IF:   You have severe back pain or lower abdominal pain.  You develop chills.  You have nausea or vomiting.  You have continued burning or discomfort with urination. MAKE SURE YOU:   Understand these instructions.  Will watch your condition.  Will get help right away if you are not doing well or get worse. Document Released: 12/13/2004 Document Revised: 09/04/2011 Document Reviewed: 04/13/2011 [facility] Patient Information 2015 [place], [place]. This information is not intended to replace advice given to you by your health care provider. Make sure you discuss any questions you have with your health care provider.

## 2014-03-15 NOTE — Progress Notes (Signed)
28 y.o. S Hispanic G3,P0 here with complaint of vaginal and urinary symptoms.  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  She treated herself wit OTC Monistat with relief. Prior to that she had D& C for miscarriage on 11/30/13.  After her colon surgery she was to start on OCP and has yet to start them.  Most recently she has tried to be SA and felt too uncomfortable and dry and did not try again until 12/20.  She was using no method of birth control at that time.  She then felt some bladder spasm and pain with urination with onset 12/24. Denies fever/chills.  LMP 12/14 X 1 week.   Most recently spotting 12/26 & 12/27. Denies new personal products. Some  concerns about  STD's.  She  also describes a 'shave bump' on the right labia that cleared up on its own.  No history of HSV.   O:Healthy female WDWN Affect: normal, orientation x 3  Exam: Abdomen: soft, non tender Lymph node: no enlargement or tenderness Pelvic exam: External genital: no lesions, or 'bumps' BUS: negative Vagina: no discharge noted other than dark pink from spotting. Affirm is taken along with GC and Chlamydia Cervix: normal, non tender Uterus: normal, non tender Adnexa:normal, non tender, no masses or fullness noted Urine:  Large RBC, 1+ leuk's UPT: negative   A: R/O UTI  R/O STD's  S/P Lap Sigmoid Colectomy 01/30/14  S/P D& C for miscarriage 11/30/13  No current method of birth control   P: Discussed findings of BTB, UTI.   Discussed Aveeno or baking soda sitz bath for comfort. Avoid moist clothes or pads for extended period of time. If working out in gym clothes or swim suits for long periods of time change underwear or bottoms of swimsuit if possible. Olive Oil use for skin protection prior to activity can be used to external skin. Advised if no menses in 2-3 weeks to get OTC UPT and repeat test.   Rx: Macrobid  100 mg BID # 14  Will follow with Urine and STD's  RV prn

## 2014-03-15 NOTE — Progress Notes (Signed)
Reviewed personally.  M. Suzanne Beckey Polkowski, MD.  

## 2014-03-16 LAB — WET PREP BY MOLECULAR PROBE
Candida species: NEGATIVE
Gardnerella vaginalis: NEGATIVE
TRICHOMONAS VAG: NEGATIVE

## 2014-03-16 LAB — URINALYSIS, MICROSCOPIC ONLY
CRYSTALS: NONE SEEN
Casts: NONE SEEN

## 2014-03-17 LAB — URINE CULTURE
COLONY COUNT: NO GROWTH
ORGANISM ID, BACTERIA: NO GROWTH

## 2014-03-17 LAB — IPS N GONORRHOEA AND CHLAMYDIA BY PCR

## 2014-03-19 ENCOUNTER — Other Ambulatory Visit: Payer: Self-pay | Admitting: Certified Nurse Midwife

## 2014-03-19 DIAGNOSIS — A749 Chlamydial infection, unspecified: Secondary | ICD-10-CM

## 2014-03-19 MED ORDER — AZITHROMYCIN 1 G PO PACK: 1.0000 | PACK | Freq: Once | ORAL | Status: DC

## 2014-03-22 ENCOUNTER — Telehealth: Payer: Self-pay | Admitting: Nurse Practitioner

## 2014-03-22 NOTE — Telephone Encounter (Signed)
Message from provider given and advised of positive Chlamydia Results.  Advised rx was sent to pharmacy, should take treatment as directed, ensure to take with food. Azithromycin 1 gram PO take at once with food sent to pharmacy of choice, rx has been sent. Patient aware.   Advised will need to complete treatment and notify partner(s). Stressed need to notify partner and abstain until treatment. To wait seven days after they have completed treatment.    Advised reportable condition and that report would be sent to health department.Confidential communicable disease report-Part one completed and faxed to [facility] Department, form [employer] 2124, faxed with fax confirmation received and original sent to medical records.   Patient did not have any questions regarding testing/treatment at this time. Advised to call back with any, she is agreeable and verbalized understanding for very important need for treatment and follow up. Patient states she will return call to our office to schedule test of cure for chlamydia.   Routing to provider for final review. Patient agreeable to disposition. Will close encounter

## 2014-03-22 NOTE — Telephone Encounter (Signed)
Patient has a question for a nurse, no details given "not urgent".

## 2014-03-22 NOTE — Telephone Encounter (Signed)
-----   Message from Regina Eck, CNM sent at 03/19/2014  9:03 AM EST ----- Notify patient that Chlamydia is positive. Will need Rx Azithromycin 1 gm order in. Stress no unprotected sexual activity. Will need TOC 3 months please schedule. Partner will need to be notified and treated.

## 2014-03-23 NOTE — Telephone Encounter (Signed)
Since starting antibiotic pt has had chills/sweats, diarrhea, and nausea w/vomitting all night.  Pt requests urgent call back.  bf

## 2014-03-23 NOTE — Telephone Encounter (Signed)
Patient took Azithromycin 1 gram last night for treatment of Chlamydia. Also, on Macrobid for UTI started 03/15/14.  About one hour after taking Azithromycin dose, developed stomach cramping and then laid down and went to bed. At about 3 am, patient woke up with abdominal cramping "all over" and episodes of diarrhea q 30 minutes. Patient also reports vomiting x 2 in the middle of the night.  This morning, symptoms have lessened. No diarrhea or vomiting. But feels very nauseated. Denies dizziness.  Patient able to tolerate small sips of water and has been doing that throughout the night.  Patient reports feeling cold last night, has resolved today.   Advised patient:  Patient is advised to avoid milk and milk products, hydrate with sips of clear fluids or water (two sips every 5-10 mins) and after tolerating clear fluids, begin BRAT diet, Bananas, rice, applesauce, toast.  Patient is also advised to go to urgent care (patient does not have pcp) if symptoms continue, increase in intensity or develops abdominal pain or fevers.   Patient had laparoscopic sigmoid colectomy 01/28/2014. Unsure if can take OTC imodium for diarrhea. Advised would review with Dr. Sabra Heck and return call.    Routing to Dr. Sabra Heck for review and recommendations.

## 2014-03-23 NOTE — Telephone Encounter (Signed)
Yes, I think Imodium is fine.  Agree with instructions for pt.

## 2014-03-23 NOTE — Telephone Encounter (Signed)
Called patient. Advised of message from Dr. Sabra Heck.  States she is resting and feels a little bit better. Diarrhea has stopped.  She is advised to call back with any further concerns.  Routing to provider for final review. Patient agreeable to disposition. Will close encounter

## 2014-03-24 ENCOUNTER — Telehealth: Payer: Self-pay | Admitting: Nurse Practitioner

## 2014-03-24 NOTE — Telephone Encounter (Signed)
Pt has a question regarding the medication she's taking.

## 2014-03-24 NOTE — Telephone Encounter (Signed)
[place] for a test of cure in 3 - 4 weeks.  Please be sure that all partner(s) are treated. Reinfection is common with chlamydia and this is the reason for testing in 3 months. Use latex condoms for prevention of STDs.

## 2014-03-24 NOTE — Telephone Encounter (Signed)
Spoke with patient. She has questions about her treatment for chlamydia.  Patient is wondering if she needs to be re-treated or re-tested earlier than 3 months. Patient is not scheduled for test of cure.   Patient states she took Azithromycin 1 gram powder dose at about 9 pm. Woke up with abdominal cramping, diarrhea and vomiting around 3 am.  Advised patient that since she did not vomit immediately after treatment, that re-treatment should not be indicated.  Patient however, would like to be retested earlier than 3 months for her piece of mind.  Advised patient would have covering provider review and give recommendations and return her call. Patient requests work number for contact.

## 2014-03-25 NOTE — Telephone Encounter (Signed)
Spoke with patient. She is given message from Dr. Quincy Simmonds. Verbalizes understanding.  Patient states she will call back to schedule recheck.  Routing to provider for final review. Patient agreeable to disposition. Will close encounter

## 2014-04-15 ENCOUNTER — Telehealth: Payer: Self-pay | Admitting: Obstetrics & Gynecology

## 2014-04-15 NOTE — Telephone Encounter (Signed)
Patient calling with questions about her last menstrual cycle in December 2015.

## 2014-04-15 NOTE — Telephone Encounter (Signed)
Spoke with patient. Patient would like to know what her LMP date for December was that we have on file. "I just don't remember the date." Advised per OV note from 03/15/2014 LMP was 03/02/2014. Patient is agreeable and verbalizes understanding.  Routing to provider for final review. Patient agreeable to disposition. Will close encounter

## 2014-04-26 ENCOUNTER — Ambulatory Visit: Payer: Self-pay | Admitting: Nurse Practitioner

## 2014-05-03 ENCOUNTER — Ambulatory Visit: Payer: Self-pay | Admitting: Nurse Practitioner

## 2014-05-03 ENCOUNTER — Telehealth: Payer: Self-pay | Admitting: Nurse Practitioner

## 2014-05-03 NOTE — Telephone Encounter (Signed)
Routed to  [place]. Pt was to see PG today

## 2014-05-03 NOTE — Telephone Encounter (Signed)
Patient called back to let us know she will need to check her work schedule and call back to reschedule recheck appointment.Nichole Davis

## 2014-05-03 NOTE — Telephone Encounter (Signed)
LMTCB CANCELED WEATHER NO VOICEMAIL

## 2014-05-17 ENCOUNTER — Ambulatory Visit: Payer: BLUE CROSS/BLUE SHIELD | Admitting: Nurse Practitioner

## 2014-05-17 ENCOUNTER — Telehealth: Payer: Self-pay | Admitting: Nurse Practitioner

## 2014-05-17 NOTE — Telephone Encounter (Signed)
Pt was here for 2:15 appointment when she got a call to pick her sister up from school. Rescheduled to Tuesday at 12:45.

## 2014-05-17 NOTE — Telephone Encounter (Signed)
Pt has appt with Edman Circle, FNP today.   Closing encounter.  Routing to provider for review.

## 2014-05-18 ENCOUNTER — Ambulatory Visit: Payer: BLUE CROSS/BLUE SHIELD | Admitting: Nurse Practitioner

## 2014-05-18 NOTE — Telephone Encounter (Signed)
Patient did not show for rescheduled appointment. This appointment was rs from yesterday. I was unable to leave a message on patient's voicemail.

## 2014-05-18 NOTE — Telephone Encounter (Signed)
OK I guess we should charge no show fee

## 2014-07-23 ENCOUNTER — Telehealth: Payer: Self-pay | Admitting: Nurse Practitioner

## 2014-07-23 NOTE — Telephone Encounter (Signed)
Spoke with patient. Patient states that she needs to reschedule her Chlamydia TOC appointment and would like to schedule with Dr.Miller. Denies any symptom concerns at this time. Would also like to schedule an appointment to discuss getting pregnant with Dr.Miller. Appointment for [facility] scheduled for 08/05/2014. Consult appointment scheduled for 5/27 at 3pm with Dr.Miller. Patient is agreeable to both appointment dates and time.   Routing to provider for final review. Patient agreeable to disposition. Patient aware provider will review message and nurse will return call with any additional instructions or change of disposition. Will close encounter.

## 2014-07-23 NOTE — Telephone Encounter (Signed)
Patient would like an appointment for std testing.

## 2014-07-26 ENCOUNTER — Telehealth: Payer: Self-pay | Admitting: Obstetrics & Gynecology

## 2014-07-26 NOTE — Telephone Encounter (Signed)
Attempted to reach patient at number provided [phone]. Recording states that "The person you have reached has a voicemail box that has not been set up yet. Please try again later. Goodbye."

## 2014-07-26 NOTE — Telephone Encounter (Signed)
Patient has some questions for Dr.Miller's nurse before coming to her next appointment "std toc".

## 2014-07-27 NOTE — Telephone Encounter (Signed)
Attempted to reach patient at number provided [phone]. Recording states "The person you have reached has a voicemail box that has not been set up yet. Please try again later. Goodbye."

## 2014-08-02 NOTE — Telephone Encounter (Signed)
Spoke with patient. Patient asking if she can combine her OV for her STD TOC with her aex. Advised last asex was on 08/21/2013 and will  Not be due at time of appointment for STD TOC on 08/02/2014. Patient is agreeable. Patient owuld like to reschedule OV for TOC due to work schedule. Appointment rescheduled for 5/23 at 4pm with Regina Eck CNM. Patient is agreeable to date and time.  Routing to provider for final review. Patient agreeable to disposition. Patient aware provider will review message and nurse will return call with any additional instructions or change of disposition. Will close encounter.

## 2014-08-02 NOTE — Telephone Encounter (Signed)
Returning call.

## 2014-08-05 ENCOUNTER — Ambulatory Visit: Payer: BLUE CROSS/BLUE SHIELD | Admitting: Obstetrics & Gynecology

## 2014-08-09 ENCOUNTER — Telehealth: Payer: Self-pay | Admitting: Certified Nurse Midwife

## 2014-08-09 ENCOUNTER — Ambulatory Visit: Payer: BLUE CROSS/BLUE SHIELD | Admitting: Certified Nurse Midwife

## 2014-08-09 NOTE — Telephone Encounter (Signed)
Patient canceled her appointment today via answering machine 08/06/14. "std toc" I was unable to leave a msg for patient to call later to reschedule.

## 2014-08-10 NOTE — Telephone Encounter (Signed)
Can do it same day.  Thanks.

## 2014-08-10 NOTE — Telephone Encounter (Signed)
Pt needs toc std testing pt cancelled appt but it looks like patient made consult appt with dr Sabra Heck for preconceptual counseling. Pt still needs std toc. Do you know why pt has appt with dr Sabra Heck but no std toc appt.

## 2014-08-10 NOTE — Telephone Encounter (Signed)
I am unsure why TOC appointment was cancelled. She will still need this appointment. I schedule her TOC appointment and a preconceptual consultation with Dr.Miller in the same phone call with her TOC being the first appointment. Patient requests to see Dr.Miller for preconceptual counseling due to her medical history over the last year.

## 2014-08-10 NOTE — Telephone Encounter (Signed)
Patient needs phone call to reschedule TOC STD

## 2014-08-10 NOTE — Telephone Encounter (Signed)
Pt cancelled her toc appt that was suppose to be on 08-09-14 with DL. Pt has preconceptual counseling appt with Dr Sabra Heck on 08-13-14. Can patient just do a urine for toc std at consult appt or does pt need to make separate appt?

## 2014-08-11 NOTE — Telephone Encounter (Signed)
Unable to reach patient to notify her that per SM we can do her std toc at consult appt.

## 2014-08-12 ENCOUNTER — Telehealth: Payer: Self-pay | Admitting: Obstetrics & Gynecology

## 2014-08-12 NOTE — Telephone Encounter (Signed)
Patient canceled her appointment for "pre pregnancy consultation & std toc" 08/13/2014. Patient did not wish to reschedule.

## 2014-08-12 NOTE — Telephone Encounter (Signed)
Dr Sabra Heck, this patient was treated for a positive Chlamydia test on 03/15/14. She has cancelled several appointments for TOC. Please advise next step.//kn

## 2014-08-13 ENCOUNTER — Ambulatory Visit (INDEPENDENT_AMBULATORY_CARE_PROVIDER_SITE_OTHER): Payer: BLUE CROSS/BLUE SHIELD | Admitting: *Deleted

## 2014-08-13 ENCOUNTER — Ambulatory Visit: Payer: BLUE CROSS/BLUE SHIELD

## 2014-08-13 ENCOUNTER — Institutional Professional Consult (permissible substitution): Payer: BLUE CROSS/BLUE SHIELD | Admitting: Obstetrics & Gynecology

## 2014-08-13 VITALS — BP 110/60 | HR 84 | Resp 18 | Ht 61.0 in | Wt 141.0 lb

## 2014-08-13 DIAGNOSIS — Z113 Encounter for screening for infections with a predominantly sexual mode of transmission: Secondary | ICD-10-CM | POA: Diagnosis not present

## 2014-08-13 NOTE — Telephone Encounter (Signed)
Appointment made for 08/13/14 at 4.//kn

## 2014-08-13 NOTE — Telephone Encounter (Signed)
Can you please call and see if she will just come in a give a urine sample for the TOC.  Doesn't need an appt, just a nurse visit?

## 2014-08-13 NOTE — Progress Notes (Signed)
Patient in today for STD TOC.

## 2014-08-14 LAB — GC/CHLAMYDIA PROBE AMP, URINE
Chlamydia, Swab/Urine, PCR: NEGATIVE
GC PROBE AMP, URINE: NEGATIVE

## 2014-09-05 ENCOUNTER — Emergency Department (HOSPITAL_COMMUNITY)
Admission: EM | Admit: 2014-09-05 | Discharge: 2014-09-05 | Disposition: A | Discharge status: Home / Self Care | Payer: BLUE CROSS/BLUE SHIELD | Attending: Emergency Medicine | Admitting: Emergency Medicine

## 2014-09-05 ENCOUNTER — Emergency Department (HOSPITAL_COMMUNITY): Payer: BLUE CROSS/BLUE SHIELD

## 2014-09-05 ENCOUNTER — Encounter (HOSPITAL_COMMUNITY): Payer: Self-pay | Admitting: *Deleted

## 2014-09-05 DIAGNOSIS — Z8719 Personal history of other diseases of the digestive system: Secondary | ICD-10-CM | POA: Diagnosis not present

## 2014-09-05 DIAGNOSIS — R079 Chest pain, unspecified: Secondary | ICD-10-CM | POA: Insufficient documentation

## 2014-09-05 DIAGNOSIS — M791 Myalgia: Secondary | ICD-10-CM | POA: Diagnosis not present

## 2014-09-05 DIAGNOSIS — R0602 Shortness of breath: Secondary | ICD-10-CM | POA: Insufficient documentation

## 2014-09-05 DIAGNOSIS — Z8619 Personal history of other infectious and parasitic diseases: Secondary | ICD-10-CM | POA: Insufficient documentation

## 2014-09-05 DIAGNOSIS — Z8659 Personal history of other mental and behavioral disorders: Secondary | ICD-10-CM | POA: Diagnosis not present

## 2014-09-05 DIAGNOSIS — Z8669 Personal history of other diseases of the nervous system and sense organs: Secondary | ICD-10-CM | POA: Diagnosis not present

## 2014-09-05 DIAGNOSIS — Z862 Personal history of diseases of the blood and blood-forming organs and certain disorders involving the immune mechanism: Secondary | ICD-10-CM | POA: Insufficient documentation

## 2014-09-05 DIAGNOSIS — Z8742 Personal history of other diseases of the female genital tract: Secondary | ICD-10-CM | POA: Insufficient documentation

## 2014-09-05 LAB — BASIC METABOLIC PANEL
Anion gap: 7 (ref 5–15)
BUN: 12 mg/dL (ref 6–20)
CALCIUM: 9.4 mg/dL (ref 8.9–10.3)
CO2: 26 mmol/L (ref 22–32)
CREATININE: 0.71 mg/dL (ref 0.44–1.00)
Chloride: 106 mmol/L (ref 101–111)
GFR calc Af Amer: >60 mL/min (ref >60)
GLUCOSE: 120 mg/dL — AB (ref 65–99)
POTASSIUM: 3.3 mmol/L — AB (ref 3.5–5.1)
Sodium: 139 mmol/L (ref 135–145)

## 2014-09-05 LAB — CBC
HCT: 37.4 % (ref 36.0–46.0)
Hemoglobin: 12 g/dL (ref 12.0–15.0)
MCH: 26.6 pg (ref 26.0–34.0)
MCHC: 32.1 g/dL (ref 30.0–36.0)
MCV: 82.9 fL (ref 78.0–100.0)
PLATELETS: 259 10*3/uL (ref 150–400)
RBC: 4.51 MIL/uL (ref 3.87–5.11)
RDW: 14.2 % (ref 11.5–15.5)
WBC: 7.4 10*3/uL (ref 4.0–10.5)

## 2014-09-05 LAB — I-STAT TROPONIN, ED: TROPONIN I, POC: 0 ng/mL (ref 0.00–0.08)

## 2014-09-05 MED ORDER — NAPROXEN 500 MG PO TABS: 500.0000 mg | ORAL_TABLET | Freq: Two times a day (BID) | ORAL | Status: DC

## 2014-09-05 NOTE — ED Notes (Signed)
Pt reports chest tightness earlier yesterday.  Pt reports now chest is achy with dizziness.  Pt reports pain is worse with inhalation.

## 2014-09-05 NOTE — Discharge Instructions (Signed)
Chest Pain (Nonspecific) °It is often hard to give a specific diagnosis for the cause of chest pain. There is always a chance that your pain could be related to something serious, such as a heart attack or a blood clot in the lungs. You need to follow up with your health care provider for further evaluation. °CAUSES  °· Heartburn. °· Pneumonia or bronchitis. °· Anxiety or stress. °· Inflammation around your heart (pericarditis) or lung (pleuritis or pleurisy). °· A blood clot in the lung. °· A collapsed lung (pneumothorax). It can develop suddenly on its own (spontaneous pneumothorax) or from trauma to the chest. °· Shingles infection (herpes zoster virus). °The chest wall is composed of bones, muscles, and cartilage. Any of these can be the source of the pain. °· The bones can be bruised by injury. °· The muscles or cartilage can be strained by coughing or overwork. °· The cartilage can be affected by inflammation and become sore (costochondritis). °DIAGNOSIS  °Lab tests or other studies may be needed to find the cause of your pain. Your health care provider may have you take a test called an ambulatory electrocardiogram (ECG). An ECG records your heartbeat patterns over a 24-hour period. You may also have other tests, such as: °· Transthoracic echocardiogram (TTE). During echocardiography, sound waves are used to evaluate how blood flows through your heart. °· Transesophageal echocardiogram (TEE). °· Cardiac monitoring. This allows your health care provider to monitor your heart rate and rhythm in real time. °· Holter monitor. This is a portable device that records your heartbeat and can help diagnose heart arrhythmias. It allows your health care provider to track your heart activity for several days, if needed. °· Stress tests by exercise or by giving medicine that makes the heart beat faster. °TREATMENT  °· Treatment depends on what may be causing your chest pain. Treatment may include: °¨ Acid blockers for  heartburn. °¨ Anti-inflammatory medicine. °¨ Pain medicine for inflammatory conditions. °¨ Antibiotics if an infection is present. °· You may be advised to change lifestyle habits. This includes stopping smoking and avoiding alcohol, caffeine, and chocolate. °· You may be advised to keep your head raised (elevated) when sleeping. This reduces the chance of acid going backward from your stomach into your esophagus. °Most of the time, nonspecific chest pain will improve within 2-3 days with rest and mild pain medicine.  °HOME CARE INSTRUCTIONS  °· If antibiotics were prescribed, take them as directed. Finish them even if you start to feel better. °· For the next few days, avoid physical activities that bring on chest pain. Continue physical activities as directed. °· Do not use any tobacco products, including cigarettes, chewing tobacco, or electronic cigarettes. °· Avoid drinking alcohol. °· Only take medicine as directed by your health care provider. °· Follow your health care provider's suggestions for further testing if your chest pain does not go away. °· Keep any follow-up appointments you made. If you do not go to an appointment, you could develop lasting (chronic) problems with pain. If there is any problem keeping an appointment, call to reschedule. °SEEK MEDICAL CARE IF:  °· Your chest pain does not go away, even after treatment. °· You have a rash with blisters on your chest. °· You have a fever. °SEEK IMMEDIATE MEDICAL CARE IF:  °· You have increased chest pain or pain that spreads to your arm, neck, jaw, back, or abdomen. °· You have shortness of breath. °· You have an increasing cough, or you cough   up blood. °· You have severe back or abdominal pain. °· You feel nauseous or vomit. °· You have severe weakness. °· You faint. °· You have chills. °This is an emergency. Do not wait to see if the pain will go away. Get medical help at once. Call your local emergency services (911 in [place] Do not drive  yourself to the hospital. °MAKE SURE YOU:  °· Understand these instructions. °· Will watch your condition. °· Will get help right away if you are not doing well or get worse. °Document Released: 12/13/2004 Document Revised: 03/10/2013 Document Reviewed: 10/09/2007 °ExitCare® Patient Information ©2015 [employer]. This information is not intended to replace advice given to you by your health care provider. Make sure you discuss any questions you have with your health care provider. ° ° °Emergency Department Resource Guide °1) Find a Doctor and Pay Out of Pocket °Although you won't have to find out who is covered by your insurance plan, it is a good idea to ask around and get recommendations. You will then need to call the office and see if the doctor you have chosen will accept you as a new patient and what types of options they offer for patients who are self-pay. Some doctors offer discounts or will set up payment plans for their patients who do not have insurance, but you will need to ask so you aren't surprised when you get to your appointment. ° °2) Contact Your Local Health Department °Not all health departments have doctors that can see patients for sick visits, but many do, so it is worth a call to see if yours does. If you don't know where your local health department is, you can check in your phone book. The CDC also has a tool to help you locate your state's health department, and many state websites also have listings of all of their local health departments. ° °3) Find a Walk-in Clinic °If your illness is not likely to be very severe or complicated, you may want to try a walk in clinic. These are popping up all over the country in pharmacies, drugstores, and shopping centers. They're usually staffed by nurse practitioners or physician assistants that have been trained to treat common illnesses and complaints. They're usually fairly quick and inexpensive. However, if you have serious medical issues or  chronic medical problems, these are probably not your best option. ° °No Primary Care Doctor: °- Call [facility] at  832-8000 - they can help you locate a primary care doctor that  accepts your insurance, provides certain services, etc. °- Physician Referral Service- 1-800-533-3463 ° °Chronic Pain Problems: °Organization         Address  Phone   Notes  °[facility]  (336) 297-2271 Patients need to be referred by their primary care doctor.  ° °Medication Assistance: °Organization         Address  Phone   Notes  °[place] [facility] [address] °[place], [place] [address] (336) 641-8030 --Must be a resident of [place] °-- Must have NO insurance coverage whatsoever (no Medicaid/ Medicare, etc.) °-- The pt. MUST have a primary care doctor that directs their care regularly and follows them in the community °  °[facility]  (866) 331-1348   °[employer]  (888) 892-1162   ° °Agencies that provide inexpensive medical care: °Organization         Address  Phone   Notes  ° Family Medicine  (  336) 832-8035   °[facility]    (336) 832-7272   °[facility] [address] °[place], [place] [address] (336) 832-4777   °[facility] [address], °[place] (336) 271-4999   °[facility]    (336) 373-0678   °[facility]    (336) 272-1050   °[facility] ° [address], [place] Phone:  (336) 832-4444, Fax:  (336) 832-4440 Hours of Operation:  9 am - 6 pm, M-F.  Also accepts Medicaid/Medicare and self-pay.  °[facility] ° [address], [place] Phone: (336) 832-3150, Fax: (336) [phone]. Hours of Operation:  8:30 am - 5:30 pm, M-F.  Also accepts Medicaid and self-pay.  °[facility] [place] [address], [place] Phone: (336) 878-6027   °[facility] [address], [place], [place]  (336)723-1848, Ext. 123 Mondays & Thursdays: 7-9 AM.  First 15 patients are seen on a first come, first serve basis. °  ° °Medicaid-accepting [place] Providers: ° °Organization         Address  Phone   Notes  °[facility] [address], [place] (336) 641-2100 Also accepts self-pay patients.  °[facility] [address], [place] ° (336) 856-9996   °[facility] [address], [place] (336) 288-8857   °[facility] [address] [place] [address], [place] (336) 299-7000   °Veita Bland [address], [place]  ° (336) 373-1557 Only accepts [place] Access Medicaid patients after they have their name applied to their card.  ° °Self-Pay (no insurance) in [place]: ° °Organization         Address  Phone   Notes  °Sickle Cell Patients, [facility] [address], [place] (336) 832-1970   °[facility] [address], [place] (336) 832-4400   °[facility] ° [address], [place] (336) 992-4800   °[facility]/Dr. Osei-Bonsu ° [address] [place] [address], [place] or [address], [place] (336) 841-8500 Phone number for both [place] and [place] locations is the same.  °[facility] [address], [place] (336) 299-0000   °[facility] [address] [place] [address], [place] or [address] (336) 852-7530 °(336) 878-2260   °[facility] [address], [place] (336) 350-1642, phone; (336) 294-5005, fax Sees patients 1st and 3rd Saturday of every month.  Must not qualify for public or private insurance (i.e. Medicaid, Medicare, [place] Health Choice, Veterans' Benefits) • Household income should be no more than 200% of the poverty level •The clinic cannot treat you if you are pregnant or think you are pregnant • Sexually transmitted  diseases are not treated at the clinic.  ° ° °Dental Care: °Organization         Address  Phone  Notes  °[facility] [address],  (336) 641-6152 Accepts children up to age 21 who are enrolled in Medicaid or [place] Health Choice; pregnant women with a Medicaid card; and children who have applied for Medicaid or [place] Health Choice, but were declined, whose parents can pay a reduced fee at time of service.  °[facility] [place]    [address], [place] (336) 641-7733 Accepts children up to age 21 who are enrolled in Medicaid or [place] Health Choice; pregnant women with a Medicaid card; and children who have applied for Medicaid or [place] Health Choice, but were declined, whose parents can pay a reduced fee at time of service.  °[facility] ° [address], [place] (336) 641-4533 Patients are seen by appointment only. Walk-ins are not accepted. [facility] will see patients 18 years of age and older. °Monday - Tuesday (8am-5pm) °Most Wednesdays (8:30-5pm) °$30 per visit, cash only  °[facility] ° [address], [place] (336) 641-4533 Patients are seen by appointment only. Walk-ins are not accepted. [facility] will see patients 18 years of age and older. °One Wednesday Evening (Monthly: Volunteer Based).  $30 per visit, cash only  °[facility]  (919) 537-3737 for adults; Children under age 4, call Graduate Pediatric Dentistry at ([phone]) [phone]. Children aged 4-14, please call (919) [phone] to request a pediatric application. ° Dental services are provided in all areas of dental care including fillings, crowns and bridges, complete and partial dentures, implants, gum treatment, root canals, and extractions. Preventive care is also provided. Treatment is provided to both adults and children. °Patients are selected via a  lottery and there is often a waiting list. °  °[facility] [address], °[place] ° (336) 763-8833 [email or web address] °  °[facility] [address], [place], [place] (336)723-1848, Ext. 123 Second and Fourth Thursday of each month, opens at 6:30 AM; Clinic ends at 9 AM.  Patients are seen on a first-come first-served basis, and a limited number are seen during each clinic.  ° °[facility] ° [address], [place], [place] (336) 723-7904   Eligibility Requirements °You must have lived in [place], [place], or [place] counties for at least the last three months. °  You cannot be eligible for state or federal sponsored healthcare insurance, including Veterans Administration, Medicaid, or Medicare. °  You generally cannot be eligible for healthcare insurance through your employer.  °  How to apply: °Eligibility screenings are held every Tuesday and Wednesday afternoon from 1:00 pm until 4:00 pm. You do not need an appointment for the interview!  °[facility] [address], [place], [place] 336-631-2330   °[facility]  336-342-8273   °[facility]  336-703-3100   °[facility]  336-570-6415   ° °Behavioral Health Resources in the Community: °Intensive Outpatient Programs °Organization         Address  Phone  Notes  °[facility] [address], [place], [place] 336-878-6098   °[facility] [address], [place], [place] 336-832-9800   °[facility] [address], [place], [place] ° 336-882-2125   °[facility] [address],  °[place], [place] 1-800-853-5163 or 336-641-4981   °Substance Abuse Resources °Organization         Address  Phone  Notes  °[facility]  336-882-2125   °[facility]  336-784-9470   °[facility]  336-285-9073   °[facility]  336-845-3988   °Residential &  Outpatient Substance Abuse Program  1-800-659-3381   °Psychological Services °Organization         Address  Phone  Notes  °[facility]  336- 832-9600   °  [facility]  336- 378-7881   °[facility] [address], [place] 1-800-853-5163 or 336-641-4981   ° °Mobile Crisis Teams °Organization         Address  Phone  Notes  °[facility], Mobile Crisis Care Unit  1-877-626-1772   °Assertive °[facility] ° [address] [place], [place] 336-834-9664   °Sharon DeEsch [address] °[place] 336-554-5454   ° °Self-Help/Support Groups °Organization         Address  Phone             Notes  °[facility] - variety of support groups  336- 373-1402 Call for more information  °Narcotics Anonymous (NA), [facility] [address], °[place]  2 meetings at this location  ° °Residential Treatment Programs °Organization         Address  Phone  Notes  °[facility] [address],    °[place]  1-866-801-8205   °[facility] ° [address], [place], [place] 704-293-8524   °[facility] [address], [place] 336-845-3988 Admissions: 8am-3pm M-F  °[facility] [address]    °[place], [place] 336-841-1104   °[facility] [address], [place], [place] 336-379-7146   °[facility] [address]  °[place], [place] 336-285-9073   °[facility] Intensive Outpatient [address], [place], [place] 336-852-3033   °[facility] [address]  °[place], [place] 1-877-615-2722 or 336-784-9470   °[facility] [address] [place], [place] 336-227-7417 Accepts Medicaid  °[facility] [address]  ° [place] 1-800-659-3381 Substance Abuse/Addiction Treatment  ° °[place] Behavioral Health Resources °Organization          Address  Phone  Notes  °[facility]  (888) 581-9988   °Julie Brannon, PhD [address] [place], [place]   (336) 349-5553 or (336) 951-0000   °[facility]   [address] °[place], [place] (336) 349-4454   °[facility] [address], [place], [place] (336) 342-8316 Insurance/Medicaid/sponsorship through [facility]  °[facility] [address]                                    [place], [place] (336) 342-8316 Therapy/tele-psych/case  °[facility] [address]  ° [place], [place] (336) 349-2233    °Dr. Arfeen  (336) 349-4544   °[facility] [place]  [employer] [place] [facility] 1) [address],  °2) [address], [place] °3)  [address], [place] (336) 349-3220 °(336) 342-7768 ° °(336) 342-8140   °[place] Child Abuse [facility] (336) 342-1394 or (336) 342-3537 (After Hours)    ° ° ° °

## 2014-09-07 NOTE — ED Provider Notes (Signed)
CSN: [id number]     Arrival date & time 09/05/14  0123 History   First MD Initiated Contact with Patient 09/05/14 0216     Chief Complaint  Patient presents with  . Chest Pain    (Consider location/radiation/quality/duration/timing/severity/associated sxs/prior Treatment) HPI Comments: 29 year old female with a history of anxiety, esophageal reflux, and anemia presents to the emergency department for further evaluation of chest pain. Patient reports that the pain began at 2200 yesterday. She describes the pain as an aching pain in her central chest which hurts with deep breathing. Patient reports that the pain migrated to her left chest and became worse with movements of her upper body. She denies any pain at present and reports no history of similar symptoms. No medications taken prior to arrival. Patient has had no associated syncope, leg swelling, fever, dizziness, nausea, or vomiting. Patient denies any recent surgeries or hospitalizations, prolonged travel, or birth control use. No family or personal history of DVT/PE. No family history of sudden cardiac death.  Patient is a 29 y.o. female presenting with chest pain. The history is provided by the patient. No language interpreter was used.  Chest Pain Associated symptoms: shortness of breath   Associated symptoms: no fever, no nausea, no numbness, not vomiting and no weakness     Past Medical History  Diagnosis Date  . Anxiety   . GERD (gastroesophageal reflux disease)   . Chest pain     pt. seen at [facility] 07/12/2011, for chest pain, cleared medically, given  Lorazepam & prilosec.  Took Lorazepam & had good relief fr. it, has not taken Prilosec  . PONV (postoperative nausea and vomiting)   . Anemia   . 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 STD (sexually transmitted disease)     trichomonas 8/09, chlamydia 2014  . Vaginal discharge 07/01/2012  . 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 Lymph node biopsy Left 2010    left side neck  . Bone marrow harvest  2005  . Retinal detachment repair w/ scleral buckle le Right 07/17/11    eye  . Scleral buckle  07/17/2011    Procedure: SCLERAL BUCKLE;  Surgeon: Hayden Pedro, MD;  Location: [place];  Service: Ophthalmology;  Laterality: Right;  . Retinal tear repair cryotherapy Left   . Implanon removal Left     removed 2011, arm  . Dilation and evacuation N/A 11/30/2013    Procedure: DILATATION AND EVACUATION;  Surgeon: Lyman Speller, MD;  Location: [place] [facility];  Service: Gynecology;  Laterality: N/A;  . Laparoscopic sigmoid colectomy N/A 01/28/2014    Procedure: LAPAROSCOPIC SIGMOID COLECTOMY, ;  Surgeon: Michael Boston, MD;  Location: [facility];  Service: General;  Laterality: N/A;  . Proctoscopy N/A 01/28/2014    Procedure: PROCTOSCOPY;  Surgeon: Michael Boston, MD;  Location: [facility];  Service: General;  Laterality: N/A;   Family History  Problem Relation Age of Onset  . Anesthesia problems Neg Hx   . Colon cancer Neg Hx   . Esophageal cancer Neg Hx   . Pancreatic cancer Neg Hx   . Rectal cancer Neg Hx   . Stomach cancer Neg Hx   . Leukemia Brother    History  Substance Use Topics  . Smoking status: Never Smoker   . Smokeless tobacco:  Never Used  . Alcohol Use: Yes     Comment: socially   OB History    Gravida Para Term Preterm AB TAB SAB Ectopic Multiple Living   3 0 0  2 1 1    0      Review of Systems  Constitutional: Negative for fever.  Respiratory: Positive for shortness of breath.   Cardiovascular: Positive for chest pain.  Gastrointestinal: Negative for nausea and vomiting.  Musculoskeletal: Positive for myalgias.  Neurological: Negative for syncope, weakness and numbness.  All other systems reviewed and are negative.   Allergies  Review of patient's allergies indicates no known allergies.  Home  Medications   Prior to Admission medications   Medication Sig Start Date End Date Taking? Authorizing Provider  azithromycin (ZITHROMAX) 1 G powder Take 1 packet by mouth once. Patient not taking: Reported on 09/05/2014 03/19/14   Regina Eck, CNM  naproxen (NAPROSYN) 500 MG tablet Take 1 tablet (500 mg total) by mouth 2 (two) times daily. 09/05/14   Antonietta Breach, PA-C   BP 105/67 mmHg  Pulse 72  Temp(Src) 98.3 F (36.8 C) (Oral)  Resp 18  SpO2 100%  LMP 08/18/2014   Physical Exam  Constitutional: She is oriented to person, place, and time. She appears well-developed and well-nourished. No distress.  HENT:  Head: Normocephalic and atraumatic.  Mouth/Throat: Oropharynx is clear and moist. No oropharyngeal exudate.  Eyes: Conjunctivae and EOM are normal. No scleral icterus.  Neck: Normal range of motion.  Cardiovascular: Normal rate, regular rhythm and intact distal pulses.   Pulmonary/Chest: Effort normal and breath sounds normal. No respiratory distress. She has no wheezes. She has no rales.  Abdominal: Soft. She exhibits no distension. There is no tenderness. There is no rebound.  Musculoskeletal: Normal range of motion.  Neurological: She is alert and oriented to person, place, and time. She exhibits normal muscle tone. Coordination normal.  Skin: Skin is warm and dry. No rash noted. She is not diaphoretic. No erythema. No pallor.  Psychiatric: She has a normal mood and affect. Her behavior is normal.  Nursing note and vitals reviewed.   ED Course  Procedures (including critical care time) Labs Review Labs Reviewed  BASIC METABOLIC PANEL - Abnormal; Notable for the following:    Potassium 3.3 (*)    Glucose, Bld 120 (*)    All other components within normal limits  CBC  I-STAT TROPOININ, ED    Imaging Review Dg Chest 2 View  09/05/2014   CLINICAL DATA:  Chest tightness since earlier yesterday. Dizziness. Pain worse with inspiration. Nonsmoker.  EXAM: CHEST  2 VIEW   COMPARISON:  None.  FINDINGS: The heart size and mediastinal contours are within normal limits. Both lungs are clear. The visualized skeletal structures are unremarkable.  IMPRESSION: No active cardiopulmonary disease.   Electronically Signed   By: Lucienne Capers M.D.   On: 09/05/2014 02:25     EKG Interpretation   Date/Time:  Sunday September 05 2014 01:36:22 EDT Ventricular Rate:  92 PR Interval:  187 QRS Duration: 78 QT Interval:  351 QTC Calculation: 434 R Axis:   57 Text Interpretation:  Sinus rhythm Baseline wander in lead(s) V1 ED  PHYSICIAN INTERPRETATION AVAILABLE IN [facility] Confirmed by TEST,  Record ([id number]) on 09/06/2014 6:36:17 AM      MDM   Final diagnoses:  Chest pain, unspecified chest pain type    29 year old female presents to the emergency department for further evaluation of chest pain. Symptoms likely  secondary to MSK etiology. Pain was made worse with movements in her upper body. She is chest pain-free during my examination. Doubt ACS given reassuring cardiac workup. Heart score is 0-1 (depending upon suspicion) c/w low risk of MACE. Patient is PERC negative; doubt DVT/PE. Also doubt dissection.  Will manage symptoms supportively as outpatient with naproxen. Patient advised follow-up with a primary care provider. Return precautions discussed and provided. Patient agreeable to plan with known address concerns. Patient discharged in good condition.   Filed Vitals:   09/05/14 0137 09/05/14 0219 09/05/14 0334  BP: 109/75 111/78 105/67  Pulse: 96 85 72  Temp: 98.5 F (36.9 C)  98.3 F (36.8 C)  TempSrc: Oral  Oral  Resp: 16 19 18   SpO2: 99% 99% 100%     Antonietta Breach, PA-C 09/07/14 [id number]  Debby Freiberg, MD 09/09/14 [phone]

## 2014-10-06 ENCOUNTER — Encounter: Payer: Self-pay | Admitting: Internal Medicine

## 2014-10-26 LAB — OB RESULTS CONSOLE GC/CHLAMYDIA
Chlamydia: NEGATIVE
Gonorrhea: NEGATIVE

## 2014-10-26 LAB — OB RESULTS CONSOLE RUBELLA ANTIBODY, IGM: Rubella: IMMUNE

## 2014-10-26 LAB — OB RESULTS CONSOLE ABO/RH: RH Type: POSITIVE

## 2014-10-26 LAB — OB RESULTS CONSOLE HIV ANTIBODY (ROUTINE TESTING): HIV: NONREACTIVE

## 2014-10-26 LAB — OB RESULTS CONSOLE RPR: RPR: NONREACTIVE

## 2014-10-26 LAB — OB RESULTS CONSOLE ANTIBODY SCREEN: ANTIBODY SCREEN: NEGATIVE

## 2014-10-26 LAB — OB RESULTS CONSOLE HEPATITIS B SURFACE ANTIGEN: HEP B S AG: NEGATIVE

## 2014-11-14 ENCOUNTER — Ambulatory Visit: Payer: BLUE CROSS/BLUE SHIELD

## 2015-03-18 ENCOUNTER — Encounter (HOSPITAL_COMMUNITY): Payer: Self-pay | Admitting: *Deleted

## 2015-03-18 ENCOUNTER — Inpatient Hospital Stay (HOSPITAL_COMMUNITY)
Admission: AD | Admit: 2015-03-18 | Discharge: 2015-03-18 | Disposition: A | Discharge status: Home / Self Care | Payer: BLUE CROSS/BLUE SHIELD | Source: Ambulatory Visit | Attending: Obstetrics & Gynecology | Admitting: Obstetrics & Gynecology

## 2015-03-18 DIAGNOSIS — F419 Anxiety disorder, unspecified: Secondary | ICD-10-CM | POA: Diagnosis not present

## 2015-03-18 DIAGNOSIS — R55 Syncope and collapse: Secondary | ICD-10-CM | POA: Insufficient documentation

## 2015-03-18 DIAGNOSIS — R42 Dizziness and giddiness: Secondary | ICD-10-CM

## 2015-03-18 DIAGNOSIS — K219 Gastro-esophageal reflux disease without esophagitis: Secondary | ICD-10-CM | POA: Insufficient documentation

## 2015-03-18 DIAGNOSIS — O9989 Other specified diseases and conditions complicating pregnancy, childbirth and the puerperium: Secondary | ICD-10-CM | POA: Diagnosis not present

## 2015-03-18 DIAGNOSIS — Z3A28 28 weeks gestation of pregnancy: Secondary | ICD-10-CM | POA: Diagnosis not present

## 2015-03-18 LAB — CBC
HCT: 34.6 % — ABNORMAL LOW (ref 36.0–46.0)
Hemoglobin: 11.2 g/dL — ABNORMAL LOW (ref 12.0–15.0)
MCH: 27.1 pg (ref 26.0–34.0)
MCHC: 32.4 g/dL (ref 30.0–36.0)
MCV: 83.6 fL (ref 78.0–100.0)
Platelets: 296 10*3/uL (ref 150–400)
RBC: 4.14 MIL/uL (ref 3.87–5.11)
RDW: 15.1 % (ref 11.5–15.5)
WBC: 11.2 10*3/uL — ABNORMAL HIGH (ref 4.0–10.5)

## 2015-03-18 LAB — COMPREHENSIVE METABOLIC PANEL
ALT: 32 U/L (ref 14–54)
ANION GAP: 9 (ref 5–15)
AST: 13 U/L — ABNORMAL LOW (ref 15–41)
Albumin: 3 g/dL — ABNORMAL LOW (ref 3.5–5.0)
Alkaline Phosphatase: 83 U/L (ref 38–126)
BILIRUBIN TOTAL: 0.1 mg/dL — AB (ref 0.3–1.2)
BUN: 5 mg/dL — AB (ref 6–20)
CO2: 21 mmol/L — ABNORMAL LOW (ref 22–32)
Calcium: 8.9 mg/dL (ref 8.9–10.3)
Chloride: 107 mmol/L (ref 101–111)
Creatinine, Ser: 0.44 mg/dL (ref 0.44–1.00)
GFR calc Af Amer: >60 mL/min (ref >60)
GFR calc non Af Amer: >60 mL/min (ref >60)
Glucose, Bld: 89 mg/dL (ref 65–99)
Potassium: 3.6 mmol/L (ref 3.5–5.1)
Sodium: 137 mmol/L (ref 135–145)
Total Protein: 7 g/dL (ref 6.5–8.1)

## 2015-03-18 LAB — URINALYSIS, ROUTINE W REFLEX MICROSCOPIC
Bilirubin Urine: NEGATIVE
GLUCOSE, UA: NEGATIVE mg/dL
Hgb urine dipstick: NEGATIVE
Ketones, ur: NEGATIVE mg/dL
LEUKOCYTES UA: NEGATIVE
Nitrite: NEGATIVE
Protein, ur: NEGATIVE mg/dL
SPECIFIC GRAVITY, URINE: 1.015 (ref 1.005–1.030)
pH: 7 (ref 5.0–8.0)

## 2015-03-18 LAB — GLUCOSE, CAPILLARY: Glucose-Capillary: 92 mg/dL (ref 65–99)

## 2015-03-18 NOTE — MAU Note (Signed)
Got light headed at work felt felt did not pass out completely happened about 1 hour ago.

## 2015-03-18 NOTE — MAU Note (Signed)
C/o of feeling dizzy and almost passing out at work; routine day with no bleeding;  or leaking fluid; denies any ucs or injuries today;

## 2015-03-18 NOTE — MAU Provider Note (Signed)
History     CSN: [id number]  Arrival date and time: 03/18/15 1323   First Provider Initiated Contact with Patient 03/18/15 1408         Chief Complaint  Patient presents with  . Near Syncope   HPI  Nichole Davis is a 29 y.o. G3P0020 at [redacted]w[redacted]d who presents for lightheadedness.  States has felt lightheaded all day. Was at work, moved from sitting to standing position to go to the bathroom & felt really dizzy. Denies LOC or fall. States has continued to feel lightheaded since then.  Denies headache, chest pain, SOB, palpitations.  Denies n/v/d.  Denies abdominal pain, LOF, or vaginal bleeding.  Positive fetal movement.  Last ate this morning at 0930 - muffin, juice, & fruit.   OB History    Gravida Para Term Preterm AB TAB SAB Ectopic Multiple Living   3 0 0  2 1 1    0      Past Medical History  Diagnosis Date  . Anxiety   . GERD (gastroesophageal reflux disease)   . Chest pain     pt. seen at [facility] 07/12/2011, for chest pain, cleared medically, given  Lorazepam & prilosec.  Took Lorazepam & had good relief fr. it, has not taken Prilosec  . PONV (postoperative nausea and vomiting)   . Anemia   . 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 STD (sexually transmitted disease)     trichomonas 8/09, chlamydia 2014  . Vaginal discharge 07/01/2012  . 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 Lymph node biopsy Left 2010    left side neck  . Bone marrow harvest  2005  . Retinal detachment repair w/ scleral buckle le Right 07/17/11    eye  . Scleral buckle  07/17/2011    Procedure: SCLERAL BUCKLE;  Surgeon: Hayden Pedro, MD;  Location: [place];  Service: Ophthalmology;  Laterality: Right;  . Retinal tear repair cryotherapy Left   . Implanon removal Left     removed 2011, arm  . Dilation and evacuation N/A 11/30/2013     Procedure: DILATATION AND EVACUATION;  Surgeon: Lyman Speller, MD;  Location: [place] [facility];  Service: Gynecology;  Laterality: N/A;  . Laparoscopic sigmoid colectomy N/A 01/28/2014    Procedure: LAPAROSCOPIC SIGMOID COLECTOMY, ;  Surgeon: Michael Boston, MD;  Location: [facility];  Service: General;  Laterality: N/A;  . Proctoscopy N/A 01/28/2014    Procedure: PROCTOSCOPY;  Surgeon: Michael Boston, MD;  Location: [facility];  Service: General;  Laterality: N/A;    Family History  Problem Relation Age of Onset  . Anesthesia problems Neg Hx   . Colon cancer Neg Hx   . Esophageal cancer Neg Hx   . Pancreatic cancer Neg Hx   . Rectal cancer Neg Hx   . Stomach cancer Neg Hx   . Leukemia Brother     Social History  Substance Use Topics  . Smoking status: Never Smoker   . Smokeless tobacco: Never Used  . Alcohol Use: Yes     Comment: socially    Allergies: No Known Allergies  Prescriptions prior to admission  Medication Sig Dispense Refill Last Dose  . azithromycin (ZITHROMAX) 1 G powder Take 1 packet by mouth once. (Patient not taking: Reported on 09/05/2014) 1 packet 0  Completed Course at Unknown time  . naproxen (NAPROSYN) 500 MG tablet Take 1 tablet (500 mg total) by mouth 2 (two) times daily. 30 tablet 0     Review of Systems  Constitutional: Negative.   HENT: Negative.   Respiratory: Negative.   Cardiovascular: Negative.   Gastrointestinal: Negative.   Genitourinary: Negative.   Neurological: Positive for dizziness. Negative for loss of consciousness.   Physical Exam   Blood pressure 125/74, pulse 84, temperature 98.4 F (36.9 C), temperature source Oral, height 5\' 1"  (1.549 m), weight 174 lb 3.2 oz (79.017 kg), last menstrual period 08/18/2014, SpO2 100 %.  Orthostatic VS for the past 24 hrs:  BP- Lying Pulse- Lying BP- Sitting Pulse- Sitting BP- Standing at 0 minutes Pulse- Standing at 0 minutes  03/18/15 1437 117/69 mmHg 102 115/74 mmHg 95 117/67 mmHg 85    Physical Exam   Nursing note and vitals reviewed. Constitutional: She is oriented to person, place, and time. She appears well-developed and well-nourished. No distress.  HENT:  Head: Normocephalic and atraumatic.  Eyes: Conjunctivae are normal. Right eye exhibits no discharge. Left eye exhibits no discharge. No scleral icterus.  Neck: Normal range of motion. Neck supple. No thyromegaly present.  Cardiovascular: Normal rate, regular rhythm and normal heart sounds.   No murmur heard. Respiratory: Effort normal and breath sounds normal. No respiratory distress. She has no wheezes.  GI: Soft. There is no tenderness.  Neurological: She is alert and oriented to person, place, and time. She has normal reflexes.  Skin: Skin is warm and dry. She is not diaphoretic.  Psychiatric: She has a normal mood and affect. Her behavior is normal. Judgment and thought content normal.   Fetal Tracing:  Baseline: 145 Variability: moderate Accelerations: 10x10 Decelerations: none  Toco: x2   MAU Course  Procedures Results for orders placed or performed during the hospital encounter of 03/18/15 (from the past 24 hour(s))  Urinalysis, Routine w reflex microscopic (not at [facility])     Status: Abnormal   Collection Time: 03/18/15  1:35 PM  Result Value Ref Range   Color, Urine YELLOW YELLOW   APPearance HAZY (A) CLEAR   Specific Gravity, Urine 1.015 1.005 - 1.030   pH 7.0 5.0 - 8.0   Glucose, UA NEGATIVE NEGATIVE mg/dL   Hgb urine dipstick NEGATIVE NEGATIVE   Bilirubin Urine NEGATIVE NEGATIVE   Ketones, ur NEGATIVE NEGATIVE mg/dL   Protein, ur NEGATIVE NEGATIVE mg/dL   Nitrite NEGATIVE NEGATIVE   Leukocytes, UA NEGATIVE NEGATIVE  Glucose, capillary     Status: None   Collection Time: 03/18/15  2:25 PM  Result Value Ref Range   Glucose-Capillary 92 65 - 99 mg/dL  CBC     Status: Abnormal   Collection Time: 03/18/15  2:46 PM  Result Value Ref Range   WBC 11.2 (H) 4.0 - 10.5 K/uL   RBC 4.14 3.87 - 5.11 MIL/uL    Hemoglobin 11.2 (L) 12.0 - 15.0 g/dL   HCT 34.6 (L) 36.0 - 46.0 %   MCV 83.6 78.0 - 100.0 fL   MCH 27.1 26.0 - 34.0 pg   MCHC 32.4 30.0 - 36.0 g/dL   RDW 15.1 11.5 - 15.5 %   Platelets 296 150 - 400 K/uL  Comprehensive metabolic panel     Status: Abnormal   Collection Time: 03/18/15  2:46 PM  Result Value Ref Range   Sodium 137 135 - 145 mmol/L   Potassium 3.6 3.5 - 5.1 mmol/L   Chloride 107  101 - 111 mmol/L   CO2 21 (L) 22 - 32 mmol/L   Glucose, Bld 89 65 - 99 mg/dL   BUN 5 (L) 6 - 20 mg/dL   Creatinine, Ser 0.44 0.44 - 1.00 mg/dL   Calcium 8.9 8.9 - 10.3 mg/dL   Total Protein 7.0 6.5 - 8.1 g/dL   Albumin 3.0 (L) 3.5 - 5.0 g/dL   AST 13 (L) 15 - 41 U/L   ALT 32 14 - 54 U/L   Alkaline Phosphatase 83 38 - 126 U/L   Total Bilirubin 0.1 (L) 0.3 - 1.2 mg/dL   GFR calc non Af Amer >60 >60 mL/min   GFR calc Af Amer >60 >60 mL/min   Anion gap 9 5 - 15    MDM VSS EKG - normal sinus rhythm Orthostatics WNL Category 1 tracing S/w Dr. Lynnette Caffey. Ok to discharge home  Assessment and Plan  A: 1. Episode of dizziness   2. Lightheadedness    P: Discharge home Increase fluid intake and change positions slowly Discussed reasons to return Keep scheduled f/u with ob  Jorje Guild, NP  03/18/2015, 2:08 PM

## 2015-03-18 NOTE — Discharge Instructions (Signed)
Dizziness Dizziness is a common problem. It is a feeling of unsteadiness or light-headedness. You may feel like you are about to faint. Dizziness can lead to injury if you stumble or fall. Anyone can become dizzy, but dizziness is more common in older adults. This condition can be caused by a number of things, including medicines, dehydration, or illness. HOME CARE INSTRUCTIONS Taking these steps may help with your condition: Eating and Drinking  Drink enough fluid to keep your urine clear or pale yellow. This helps to keep you from becoming dehydrated. Try to drink more clear fluids, such as water.  Do not drink alcohol.  Limit your caffeine intake if directed by your health care provider.  Limit your salt intake if directed by your health care provider. Activity 1. Avoid making quick movements. 1. Rise slowly from chairs and steady yourself until you feel okay. 2. In the morning, first sit up on the side of the bed. When you feel okay, stand slowly while you hold onto something until you know that your balance is fine. 2. Move your legs often if you need to stand in one place for a long time. Tighten and relax your muscles in your legs while you are standing. 3. Do not drive or operate heavy machinery if you feel dizzy. 4. Avoid bending down if you feel dizzy. Place items in your home so that they are easy for you to reach without leaning over. Lifestyle  Do not use any tobacco products, including cigarettes, chewing tobacco, or electronic cigarettes. If you need help quitting, ask your health care provider.  Try to reduce your stress level, such as with yoga or meditation. Talk with your health care provider if you need help. General Instructions  Watch your dizziness for any changes.  Take medicines only as directed by your health care provider. Talk with your health care provider if you think that your dizziness is caused by a medicine that you are taking.  Tell a friend or a  family member that you are feeling dizzy. If he or she notices any changes in your behavior, have this person call your health care provider.  Keep all follow-up visits as directed by your health care provider. This is important. SEEK MEDICAL CARE IF:  Your dizziness does not go away.  Your dizziness or light-headedness gets worse.  You feel nauseous.  You have reduced hearing.  You have new symptoms.  You are unsteady on your feet or you feel like the room is spinning. SEEK IMMEDIATE MEDICAL CARE IF:  You vomit or have diarrhea and are unable to eat or drink anything.  You have problems talking, walking, swallowing, or using your arms, hands, or legs.  You feel generally weak.  You are not thinking clearly or you have trouble forming sentences. It may take a friend or family member to notice this.  You have chest pain, abdominal pain, shortness of breath, or sweating.  Your vision changes.  You notice any bleeding.  You have a headache.  You have neck pain or a stiff neck.  You have a fever.   This information is not intended to replace advice given to you by your health care provider. Make sure you discuss any questions you have with your health care provider.   Document Released: 08/29/2000 Document Revised: 07/20/2014 Document Reviewed: 03/01/2014 Elsevier Interactive Patient Education 2016 [place].   Fetal Movement Counts Patient Name: __________________________________________________ Patient Due Date: ____________________ Performing a fetal movement count is highly  recommended in high-risk pregnancies, but it is good for every pregnant woman to do. Your health care provider may ask you to start counting fetal movements at 28 weeks of the pregnancy. Fetal movements often increase:  After eating a full meal.  After physical activity.  After eating or drinking something sweet or cold.  At rest. Pay attention to when you feel the baby is most active.  This will help you notice a pattern of your baby's sleep and wake cycles and what factors contribute to an increase in fetal movement. It is important to perform a fetal movement count at the same time each day when your baby is normally most active.  HOW TO COUNT FETAL MOVEMENTS 5. Find a quiet and comfortable area to sit or lie down on your left side. Lying on your left side provides the best blood and oxygen circulation to your baby. 6. Write down the day and time on a sheet of paper or in a journal. 7. Start counting kicks, flutters, swishes, rolls, or jabs in a 2-hour period. You should feel at least 10 movements within 2 hours. 8. If you do not feel 10 movements in 2 hours, wait 2-3 hours and count again. Look for a change in the pattern or not enough counts in 2 hours. SEEK MEDICAL CARE IF:  You feel less than 10 counts in 2 hours, tried twice.  There is no movement in over an hour.  The pattern is changing or taking longer each day to reach 10 counts in 2 hours.  You feel the baby is not moving as he or she usually does. Date: ____________ Movements: ____________ Start time: ____________ Elizebeth Koller time: ____________  Date: ____________ Movements: ____________ Start time: ____________ Elizebeth Koller time: ____________ Date: ____________ Movements: ____________ Start time: ____________ Elizebeth Koller time: ____________ Date: ____________ Movements: ____________ Start time: ____________ Elizebeth Koller time: ____________ Date: ____________ Movements: ____________ Start time: ____________ Elizebeth Koller time: ____________ Date: ____________ Movements: ____________ Start time: ____________ Elizebeth Koller time: ____________ Date: ____________ Movements: ____________ Start time: ____________ Elizebeth Koller time: ____________ Date: ____________ Movements: ____________ Start time: ____________ Elizebeth Koller time: ____________  Date: ____________ Movements: ____________ Start time: ____________ Elizebeth Koller time: ____________ Date: ____________ Movements:  ____________ Start time: ____________ Elizebeth Koller time: ____________ Date: ____________ Movements: ____________ Start time: ____________ Elizebeth Koller time: ____________ Date: ____________ Movements: ____________ Start time: ____________ Elizebeth Koller time: ____________ Date: ____________ Movements: ____________ Start time: ____________ Elizebeth Koller time: ____________ Date: ____________ Movements: ____________ Start time: ____________ Elizebeth Koller time: ____________ Date: ____________ Movements: ____________ Start time: ____________ Elizebeth Koller time: ____________  Date: ____________ Movements: ____________ Start time: ____________ Elizebeth Koller time: ____________ Date: ____________ Movements: ____________ Start time: ____________ Elizebeth Koller time: ____________ Date: ____________ Movements: ____________ Start time: ____________ Elizebeth Koller time: ____________ Date: ____________ Movements: ____________ Start time: ____________ Elizebeth Koller time: ____________ Date: ____________ Movements: ____________ Start time: ____________ Elizebeth Koller time: ____________ Date: ____________ Movements: ____________ Start time: ____________ Elizebeth Koller time: ____________ Date: ____________ Movements: ____________ Start time: ____________ Elizebeth Koller time: ____________  Date: ____________ Movements: ____________ Start time: ____________ Elizebeth Koller time: ____________ Date: ____________ Movements: ____________ Start time: ____________ Elizebeth Koller time: ____________ Date: ____________ Movements: ____________ Start time: ____________ Elizebeth Koller time: ____________ Date: ____________ Movements: ____________ Start time: ____________ Elizebeth Koller time: ____________ Date: ____________ Movements: ____________ Start time: ____________ Elizebeth Koller time: ____________ Date: ____________ Movements: ____________ Start time: ____________ Elizebeth Koller time: ____________ Date: ____________ Movements: ____________ Start time: ____________ Elizebeth Koller time: ____________  Date: ____________ Movements: ____________ Start time: ____________ Elizebeth Koller  time: ____________ Date: ____________ Movements: ____________ Start time: ____________ Elizebeth Koller time: ____________ Date: ____________ Movements: ____________  Start time: ____________ Elizebeth Koller time: ____________ Date: ____________ Movements: ____________ Start time: ____________ Elizebeth Koller time: ____________ Date: ____________ Movements: ____________ Start time: ____________ Elizebeth Koller time: ____________ Date: ____________ Movements: ____________ Start time: ____________ Elizebeth Koller time: ____________ Date: ____________ Movements: ____________ Start time: ____________ Elizebeth Koller time: ____________  Date: ____________ Movements: ____________ Start time: ____________ Elizebeth Koller time: ____________ Date: ____________ Movements: ____________ Start time: ____________ Elizebeth Koller time: ____________ Date: ____________ Movements: ____________ Start time: ____________ Elizebeth Koller time: ____________ Date: ____________ Movements: ____________ Start time: ____________ Elizebeth Koller time: ____________ Date: ____________ Movements: ____________ Start time: ____________ Elizebeth Koller time: ____________ Date: ____________ Movements: ____________ Start time: ____________ Elizebeth Koller time: ____________ Date: ____________ Movements: ____________ Start time: ____________ Elizebeth Koller time: ____________  Date: ____________ Movements: ____________ Start time: ____________ Elizebeth Koller time: ____________ Date: ____________ Movements: ____________ Start time: ____________ Elizebeth Koller time: ____________ Date: ____________ Movements: ____________ Start time: ____________ Elizebeth Koller time: ____________ Date: ____________ Movements: ____________ Start time: ____________ Elizebeth Koller time: ____________ Date: ____________ Movements: ____________ Start time: ____________ Elizebeth Koller time: ____________ Date: ____________ Movements: ____________ Start time: ____________ Elizebeth Koller time: ____________ Date: ____________ Movements: ____________ Start time: ____________ Elizebeth Koller time: ____________  Date: ____________  Movements: ____________ Start time: ____________ Elizebeth Koller time: ____________ Date: ____________ Movements: ____________ Start time: ____________ Elizebeth Koller time: ____________ Date: ____________ Movements: ____________ Start time: ____________ Elizebeth Koller time: ____________ Date: ____________ Movements: ____________ Start time: ____________ Elizebeth Koller time: ____________ Date: ____________ Movements: ____________ Start time: ____________ Elizebeth Koller time: ____________ Date: ____________ Movements: ____________ Start time: ____________ Elizebeth Koller time: ____________   This information is not intended to replace advice given to you by your health care provider. Make sure you discuss any questions you have with your health care provider.   Document Released: 04/04/2006 Document Revised: 03/26/2014 Document Reviewed: 12/31/2011 Elsevier Interactive Patient Education [employer].

## 2015-06-02 ENCOUNTER — Encounter (HOSPITAL_COMMUNITY): Payer: Self-pay | Admitting: *Deleted

## 2015-06-02 ENCOUNTER — Telehealth (HOSPITAL_COMMUNITY): Payer: Self-pay | Admitting: *Deleted

## 2015-06-02 LAB — OB RESULTS CONSOLE GBS: GBS: NEGATIVE

## 2015-06-02 NOTE — Telephone Encounter (Signed)
Preadmission screen  

## 2015-06-02 NOTE — Telephone Encounter (Signed)
Preadmission screenPreadmission screen 

## 2015-06-04 ENCOUNTER — Inpatient Hospital Stay (HOSPITAL_COMMUNITY): Admission: RE | Admit: 2015-06-04 | Payer: BLUE CROSS/BLUE SHIELD | Source: Ambulatory Visit

## 2015-06-06 ENCOUNTER — Encounter (HOSPITAL_COMMUNITY): Payer: Self-pay | Admitting: *Deleted

## 2015-06-06 ENCOUNTER — Inpatient Hospital Stay (HOSPITAL_COMMUNITY)
Admission: AD | Admit: 2015-06-06 | Discharge: 2015-06-10 | DRG: 766 | Disposition: A | Discharge status: Home / Self Care | Payer: BLUE CROSS/BLUE SHIELD | Source: Ambulatory Visit | Attending: Obstetrics & Gynecology | Admitting: Obstetrics & Gynecology

## 2015-06-06 DIAGNOSIS — F419 Anxiety disorder, unspecified: Secondary | ICD-10-CM | POA: Diagnosis present

## 2015-06-06 DIAGNOSIS — Z806 Family history of leukemia: Secondary | ICD-10-CM

## 2015-06-06 DIAGNOSIS — O3663X0 Maternal care for excessive fetal growth, third trimester, not applicable or unspecified: Secondary | ICD-10-CM | POA: Diagnosis present

## 2015-06-06 DIAGNOSIS — Z3490 Encounter for supervision of normal pregnancy, unspecified, unspecified trimester: Secondary | ICD-10-CM

## 2015-06-06 DIAGNOSIS — D649 Anemia, unspecified: Secondary | ICD-10-CM | POA: Diagnosis not present

## 2015-06-06 DIAGNOSIS — O9081 Anemia of the puerperium: Secondary | ICD-10-CM | POA: Diagnosis not present

## 2015-06-06 DIAGNOSIS — K219 Gastro-esophageal reflux disease without esophagitis: Secondary | ICD-10-CM | POA: Diagnosis present

## 2015-06-06 DIAGNOSIS — O9962 Diseases of the digestive system complicating childbirth: Secondary | ICD-10-CM | POA: Diagnosis present

## 2015-06-06 DIAGNOSIS — O324XX0 Maternal care for high head at term, not applicable or unspecified: Principal | ICD-10-CM | POA: Diagnosis present

## 2015-06-06 DIAGNOSIS — Z3A40 40 weeks gestation of pregnancy: Secondary | ICD-10-CM

## 2015-06-06 LAB — COMPREHENSIVE METABOLIC PANEL
ALK PHOS: 174 U/L — AB (ref 38–126)
ALT: 23 U/L (ref 14–54)
ANION GAP: 5 (ref 5–15)
AST: 20 U/L (ref 15–41)
Albumin: 2.9 g/dL — ABNORMAL LOW (ref 3.5–5.0)
BILIRUBIN TOTAL: 0.5 mg/dL (ref 0.3–1.2)
BUN: 11 mg/dL (ref 6–20)
CHLORIDE: 111 mmol/L (ref 101–111)
CO2: 22 mmol/L (ref 22–32)
Calcium: 8.4 mg/dL — ABNORMAL LOW (ref 8.9–10.3)
Creatinine, Ser: 0.68 mg/dL (ref 0.44–1.00)
GFR calc Af Amer: >60 mL/min (ref >60)
Glucose, Bld: 88 mg/dL (ref 65–99)
POTASSIUM: 3.9 mmol/L (ref 3.5–5.1)
SODIUM: 138 mmol/L (ref 135–145)
TOTAL PROTEIN: 6.6 g/dL (ref 6.5–8.1)

## 2015-06-06 LAB — CBC
HEMATOCRIT: 34.8 % — AB (ref 36.0–46.0)
HEMOGLOBIN: 11.1 g/dL — AB (ref 12.0–15.0)
MCH: 25.1 pg — ABNORMAL LOW (ref 26.0–34.0)
MCHC: 31.9 g/dL (ref 30.0–36.0)
MCV: 78.6 fL (ref 78.0–100.0)
Platelets: 236 10*3/uL (ref 150–400)
RBC: 4.43 MIL/uL (ref 3.87–5.11)
RDW: 15.8 % — ABNORMAL HIGH (ref 11.5–15.5)
WBC: 13.6 10*3/uL — AB (ref 4.0–10.5)

## 2015-06-06 LAB — TYPE AND SCREEN
ABO/RH(D): A POS
ANTIBODY SCREEN: NEGATIVE

## 2015-06-06 MED ORDER — LACTATED RINGERS IV SOLN
INTRAVENOUS | Status: DC
  Administered 2015-06-07 (×4): via INTRAVENOUS

## 2015-06-06 NOTE — MAU Note (Signed)
Pt reports "i think i am having contractions." , reports pain since 5:30, SVE in office 4 cm.

## 2015-06-07 ENCOUNTER — Encounter (HOSPITAL_COMMUNITY)
Admission: AD | Disposition: A | Discharge status: Home / Self Care | Payer: Self-pay | Source: Ambulatory Visit | Attending: Obstetrics & Gynecology

## 2015-06-07 ENCOUNTER — Inpatient Hospital Stay (HOSPITAL_COMMUNITY): Payer: BLUE CROSS/BLUE SHIELD | Admitting: Anesthesiology

## 2015-06-07 ENCOUNTER — Encounter (HOSPITAL_COMMUNITY): Payer: Self-pay | Admitting: *Deleted

## 2015-06-07 DIAGNOSIS — Z3490 Encounter for supervision of normal pregnancy, unspecified, unspecified trimester: Secondary | ICD-10-CM

## 2015-06-07 LAB — COMPREHENSIVE METABOLIC PANEL
ALBUMIN: 2.5 g/dL — AB (ref 3.5–5.0)
ALT: 18 U/L (ref 14–54)
ANION GAP: 3 — AB (ref 5–15)
AST: 18 U/L (ref 15–41)
Alkaline Phosphatase: 149 U/L — ABNORMAL HIGH (ref 38–126)
BILIRUBIN TOTAL: 0.6 mg/dL (ref 0.3–1.2)
BUN: 11 mg/dL (ref 6–20)
CO2: 22 mmol/L (ref 22–32)
Calcium: 8.1 mg/dL — ABNORMAL LOW (ref 8.9–10.3)
Chloride: 111 mmol/L (ref 101–111)
Creatinine, Ser: 0.73 mg/dL (ref 0.44–1.00)
GFR calc Af Amer: >60 mL/min (ref >60)
GFR calc non Af Amer: >60 mL/min (ref >60)
GLUCOSE: 96 mg/dL (ref 65–99)
POTASSIUM: 3.8 mmol/L (ref 3.5–5.1)
Sodium: 136 mmol/L (ref 135–145)
TOTAL PROTEIN: 5.9 g/dL — AB (ref 6.5–8.1)

## 2015-06-07 LAB — CBC
HEMATOCRIT: 31.2 % — AB (ref 36.0–46.0)
Hemoglobin: 9.8 g/dL — ABNORMAL LOW (ref 12.0–15.0)
MCH: 24.8 pg — ABNORMAL LOW (ref 26.0–34.0)
MCHC: 31.4 g/dL (ref 30.0–36.0)
MCV: 79 fL (ref 78.0–100.0)
PLATELETS: 232 10*3/uL (ref 150–400)
RBC: 3.95 MIL/uL (ref 3.87–5.11)
RDW: 16 % — AB (ref 11.5–15.5)
WBC: 14.6 10*3/uL — ABNORMAL HIGH (ref 4.0–10.5)

## 2015-06-07 LAB — RPR: RPR: NONREACTIVE

## 2015-06-07 LAB — ABO/RH: ABO/RH(D): A POS

## 2015-06-07 SURGERY — Surgical Case
Anesthesia: Epidural

## 2015-06-07 MED ORDER — PHENYLEPHRINE 40 MCG/ML (10ML) SYRINGE FOR IV PUSH (FOR BLOOD PRESSURE SUPPORT)
80.0000 ug | PREFILLED_SYRINGE | INTRAVENOUS | Status: DC | PRN
  Filled 2015-06-07: qty 20

## 2015-06-07 MED ORDER — SIMETHICONE 80 MG PO CHEW
80.0000 mg | CHEWABLE_TABLET | ORAL | Status: DC
  Administered 2015-06-08 – 2015-06-10 (×3): 80 mg via ORAL
  Filled 2015-06-07 (×3): qty 1

## 2015-06-07 MED ORDER — PRENATAL MULTIVITAMIN CH
1.0000 | ORAL_TABLET | Freq: Every day | ORAL | Status: DC
  Administered 2015-06-08 – 2015-06-10 (×3): 1 via ORAL
  Filled 2015-06-07 (×3): qty 1

## 2015-06-07 MED ORDER — MORPHINE SULFATE (PF) 0.5 MG/ML IJ SOLN
INTRAMUSCULAR | Status: DC | PRN
  Administered 2015-06-07: 3.5 mg via EPIDURAL
  Administered 2015-06-07: 1.5 mg via INTRAVENOUS

## 2015-06-07 MED ORDER — METOCLOPRAMIDE HCL 5 MG/ML IJ SOLN
10.0000 mg | Freq: Once | INTRAMUSCULAR | Status: AC
  Administered 2015-06-07: 10 mg via INTRAVENOUS
  Filled 2015-06-07: qty 2

## 2015-06-07 MED ORDER — OXYCODONE-ACETAMINOPHEN 5-325 MG PO TABS: 2.0000 | ORAL_TABLET | ORAL | Status: DC | PRN

## 2015-06-07 MED ORDER — DEXAMETHASONE SODIUM PHOSPHATE 4 MG/ML IJ SOLN
INTRAMUSCULAR | Status: DC | PRN
  Administered 2015-06-07: 10 mg via INTRAVENOUS

## 2015-06-07 MED ORDER — KETOROLAC TROMETHAMINE 30 MG/ML IJ SOLN
INTRAMUSCULAR | Status: AC
  Filled 2015-06-07: qty 1

## 2015-06-07 MED ORDER — PHENYLEPHRINE HCL 10 MG/ML IJ SOLN
INTRAMUSCULAR | Status: DC | PRN
  Administered 2015-06-07: 80 ug via INTRAVENOUS

## 2015-06-07 MED ORDER — FLEET ENEMA 7-19 GM/118ML RE ENEM: 1.0000 | ENEMA | RECTAL | Status: DC | PRN

## 2015-06-07 MED ORDER — OXYTOCIN 10 UNIT/ML IJ SOLN
1.0000 m[IU]/min | INTRAVENOUS | Status: DC
  Administered 2015-06-07: 40 [IU] via INTRAVENOUS
  Administered 2015-06-07: 1 m[IU]/min via INTRAVENOUS
  Filled 2015-06-07: qty 10

## 2015-06-07 MED ORDER — SIMETHICONE 80 MG PO CHEW: 80.0000 mg | CHEWABLE_TABLET | ORAL | Status: DC | PRN

## 2015-06-07 MED ORDER — CEFAZOLIN SODIUM-DEXTROSE 2-3 GM-% IV SOLR: 2.0000 g | Freq: Once | INTRAVENOUS | Status: DC

## 2015-06-07 MED ORDER — SENNOSIDES-DOCUSATE SODIUM 8.6-50 MG PO TABS
2.0000 | ORAL_TABLET | ORAL | Status: DC
  Administered 2015-06-08 – 2015-06-10 (×3): 2 via ORAL
  Filled 2015-06-07 (×3): qty 2

## 2015-06-07 MED ORDER — OXYTOCIN 10 UNIT/ML IJ SOLN: 2.5000 [IU]/h | INTRAMUSCULAR | Status: DC

## 2015-06-07 MED ORDER — LACTATED RINGERS IV SOLN: 500.0000 mL | INTRAVENOUS | Status: DC | PRN

## 2015-06-07 MED ORDER — ACETAMINOPHEN 325 MG PO TABS: 650.0000 mg | ORAL_TABLET | ORAL | Status: DC | PRN

## 2015-06-07 MED ORDER — IBUPROFEN 600 MG PO TABS
600.0000 mg | ORAL_TABLET | Freq: Four times a day (QID) | ORAL | Status: DC
  Administered 2015-06-08 – 2015-06-10 (×9): 600 mg via ORAL
  Filled 2015-06-07 (×10): qty 1

## 2015-06-07 MED ORDER — LACTATED RINGERS IV SOLN
INTRAVENOUS | Status: DC | PRN
  Administered 2015-06-07 (×2): via INTRAVENOUS

## 2015-06-07 MED ORDER — LACTATED RINGERS IV SOLN
INTRAVENOUS | Status: DC
  Administered 2015-06-07 – 2015-06-08 (×3): via INTRAVENOUS

## 2015-06-07 MED ORDER — EPHEDRINE 5 MG/ML INJ: 10.0000 mg | INTRAVENOUS | Status: DC | PRN

## 2015-06-07 MED ORDER — TERBUTALINE SULFATE 1 MG/ML IJ SOLN: 0.2500 mg | Freq: Once | INTRAMUSCULAR | Status: DC | PRN

## 2015-06-07 MED ORDER — OXYCODONE-ACETAMINOPHEN 5-325 MG PO TABS
1.0000 | ORAL_TABLET | ORAL | Status: DC | PRN
  Administered 2015-06-08 (×2): 1 via ORAL
  Filled 2015-06-07 (×2): qty 1

## 2015-06-07 MED ORDER — OXYTOCIN 10 UNIT/ML IJ SOLN: 2.5000 [IU]/h | INTRAVENOUS | Status: AC

## 2015-06-07 MED ORDER — ONDANSETRON HCL 4 MG/2ML IJ SOLN
INTRAMUSCULAR | Status: DC | PRN
  Administered 2015-06-07: 4 mg via INTRAVENOUS

## 2015-06-07 MED ORDER — CITRIC ACID-SODIUM CITRATE 334-500 MG/5ML PO SOLN
30.0000 mL | ORAL | Status: DC | PRN
Start: 2015-06-07 — End: 2015-06-07
  Administered 2015-06-07: 30 mL via ORAL
  Filled 2015-06-07: qty 15

## 2015-06-07 MED ORDER — SIMETHICONE 80 MG PO CHEW
80.0000 mg | CHEWABLE_TABLET | Freq: Three times a day (TID) | ORAL | Status: DC
  Administered 2015-06-08 – 2015-06-10 (×5): 80 mg via ORAL
  Filled 2015-06-07 (×5): qty 1

## 2015-06-07 MED ORDER — SCOPOLAMINE 1 MG/3DAYS TD PT72
MEDICATED_PATCH | TRANSDERMAL | Status: DC | PRN
  Administered 2015-06-07: 1 via TRANSDERMAL

## 2015-06-07 MED ORDER — LACTATED RINGERS IV SOLN: 500.0000 mL | Freq: Once | INTRAVENOUS | Status: DC

## 2015-06-07 MED ORDER — DEXTROSE 5 % IV SOLN
2.0000 g | INTRAVENOUS | Status: DC | PRN
  Administered 2015-06-07: 2 g via INTRAVENOUS

## 2015-06-07 MED ORDER — ONDANSETRON HCL 4 MG/2ML IJ SOLN: 4.0000 mg | Freq: Four times a day (QID) | INTRAMUSCULAR | Status: DC | PRN

## 2015-06-07 MED ORDER — KETOROLAC TROMETHAMINE 30 MG/ML IJ SOLN
30.0000 mg | Freq: Once | INTRAMUSCULAR | Status: AC
  Administered 2015-06-07: 30 mg via INTRAMUSCULAR

## 2015-06-07 MED ORDER — DIBUCAINE 1 % RE OINT: 1.0000 "application " | TOPICAL_OINTMENT | RECTAL | Status: DC | PRN

## 2015-06-07 MED ORDER — MENTHOL 3 MG MT LOZG: 1.0000 | LOZENGE | OROMUCOSAL | Status: DC | PRN

## 2015-06-07 MED ORDER — LIDOCAINE HCL (PF) 1 % IJ SOLN: 30.0000 mL | INTRAMUSCULAR | Status: DC | PRN

## 2015-06-07 MED ORDER — TETANUS-DIPHTH-ACELL PERTUSSIS 5-2.5-18.5 LF-MCG/0.5 IM SUSP: 0.5000 mL | Freq: Once | INTRAMUSCULAR | Status: DC

## 2015-06-07 MED ORDER — LIDOCAINE HCL (PF) 1 % IJ SOLN
INTRAMUSCULAR | Status: DC | PRN
  Administered 2015-06-07: 8 mL via EPIDURAL
  Administered 2015-06-07: 5 mL via EPIDURAL

## 2015-06-07 MED ORDER — LACTATED RINGERS IV BOLUS (SEPSIS)
500.0000 mL | Freq: Once | INTRAVENOUS | Status: AC
  Administered 2015-06-07: 500 mL via INTRAVENOUS

## 2015-06-07 MED ORDER — LANOLIN HYDROUS EX OINT: 1.0000 "application " | TOPICAL_OINTMENT | CUTANEOUS | Status: DC | PRN

## 2015-06-07 MED ORDER — WITCH HAZEL-GLYCERIN EX PADS: 1.0000 "application " | MEDICATED_PAD | CUTANEOUS | Status: DC | PRN

## 2015-06-07 MED ORDER — DIPHENHYDRAMINE HCL 50 MG/ML IJ SOLN: 12.5000 mg | INTRAMUSCULAR | Status: DC | PRN

## 2015-06-07 MED ORDER — OXYCODONE-ACETAMINOPHEN 5-325 MG PO TABS: 1.0000 | ORAL_TABLET | ORAL | Status: DC | PRN

## 2015-06-07 MED ORDER — ZOLPIDEM TARTRATE 5 MG PO TABS: 5.0000 mg | ORAL_TABLET | Freq: Every evening | ORAL | Status: DC | PRN

## 2015-06-07 MED ORDER — DIPHENHYDRAMINE HCL 25 MG PO CAPS: 25.0000 mg | ORAL_CAPSULE | Freq: Four times a day (QID) | ORAL | Status: DC | PRN

## 2015-06-07 MED ORDER — SODIUM BICARBONATE 8.4 % IV SOLN
INTRAVENOUS | Status: DC | PRN
  Administered 2015-06-07: 4 mL via EPIDURAL
  Administered 2015-06-07 (×2): 5 mL via EPIDURAL

## 2015-06-07 MED ORDER — OXYTOCIN BOLUS FROM INFUSION: 500.0000 mL | INTRAVENOUS | Status: DC

## 2015-06-07 MED ORDER — FENTANYL 2.5 MCG/ML BUPIVACAINE 1/10 % EPIDURAL INFUSION (WH - ANES)
14.0000 mL/h | INTRAMUSCULAR | Status: DC | PRN
  Administered 2015-06-07: 14 mL/h via EPIDURAL
  Filled 2015-06-07: qty 125

## 2015-06-07 MED ORDER — PHENYLEPHRINE 40 MCG/ML (10ML) SYRINGE FOR IV PUSH (FOR BLOOD PRESSURE SUPPORT): 80.0000 ug | PREFILLED_SYRINGE | INTRAVENOUS | Status: DC | PRN

## 2015-06-07 SURGICAL SUPPLY — 33 items
APL SKNCLS STERI-STRIP NONHPOA (GAUZE/BANDAGES/DRESSINGS) ×1
BENZOIN TINCTURE PRP APPL 2/3 (GAUZE/BANDAGES/DRESSINGS) ×1 IMPLANT
CLAMP CORD UMBIL (MISCELLANEOUS) IMPLANT
CLOSURE STERI STRIP 1/2 X4 (GAUZE/BANDAGES/DRESSINGS) ×2 IMPLANT
CLOTH BEACON ORANGE TIMEOUT ST (SAFETY) ×2 IMPLANT
DRSG OPSITE POSTOP 4X10 (GAUZE/BANDAGES/DRESSINGS) ×2 IMPLANT
DURAPREP 26ML APPLICATOR (WOUND CARE) ×2 IMPLANT
ELECT REM PT RETURN 9FT ADLT (ELECTROSURGICAL) ×2
ELECTRODE REM PT RTRN 9FT ADLT (ELECTROSURGICAL) ×1 IMPLANT
EXTRACTOR VACUUM KIWI (MISCELLANEOUS) IMPLANT
EXTRACTOR VACUUM M CUP 4 TUBE (SUCTIONS) IMPLANT
GLOVE BIO SURGEON STRL SZ 6 (GLOVE) ×2 IMPLANT
GLOVE BIOGEL PI IND STRL 6 (GLOVE) ×2 IMPLANT
GLOVE BIOGEL PI IND STRL 7.0 (GLOVE) ×1 IMPLANT
GLOVE BIOGEL PI INDICATOR 6 (GLOVE) ×2
GLOVE BIOGEL PI INDICATOR 7.0 (GLOVE) ×1
GOWN STRL REUS W/TWL LRG LVL3 (GOWN DISPOSABLE) ×4 IMPLANT
KIT ABG SYR 3ML LUER SLIP (SYRINGE) ×2 IMPLANT
LIQUID BAND (GAUZE/BANDAGES/DRESSINGS) IMPLANT
NDL HYPO 25X5/8 SAFETYGLIDE (NEEDLE) ×1 IMPLANT
NEEDLE HYPO 25X5/8 SAFETYGLIDE (NEEDLE) ×2 IMPLANT
NS IRRIG 1000ML POUR BTL (IV SOLUTION) ×2 IMPLANT
PACK C SECTION WH (CUSTOM PROCEDURE TRAY) ×2 IMPLANT
PAD OB MATERNITY 4.3X12.25 (PERSONAL CARE ITEMS) ×2 IMPLANT
PENCIL SMOKE EVAC W/HOLSTER (ELECTROSURGICAL) ×2 IMPLANT
SUT CHROMIC 0 CTX 36 (SUTURE) ×6 IMPLANT
SUT MON AB 2-0 CT1 27 (SUTURE) ×2 IMPLANT
SUT PDS AB 0 CT1 27 (SUTURE) IMPLANT
SUT PLAIN 0 NONE (SUTURE) IMPLANT
SUT VIC AB 0 CT1 36 (SUTURE) IMPLANT
SUT VIC AB 4-0 KS 27 (SUTURE) IMPLANT
TOWEL OR 17X24 6PK STRL BLUE (TOWEL DISPOSABLE) ×2 IMPLANT
TRAY FOLEY CATH SILVER 14FR (SET/KITS/TRAYS/PACK) IMPLANT

## 2015-06-07 NOTE — Progress Notes (Signed)
FHT cat one Pit on UCs MVU about 180 Cx rim/C/-1

## 2015-06-07 NOTE — Op Note (Signed)
Nichole Davis PROCEDURE DATE: 06/06/2015 - 06/07/2015  PREOPERATIVE DIAGNOSIS: Intrauterine pregnancy at  [redacted]w[redacted]d weeks gestation, arrest of descent  POSTOPERATIVE DIAGNOSIS: The same  PROCEDURE:  Primary  Low Transverse Cesarean Section  SURGEON:  Dr. Linda Hedges  INDICATIONS: Nichole Davis is a 30 y.o. [id number] at [redacted]w[redacted]d scheduled for cesarean section secondary to arrest of descent.  The risks of cesarean section discussed with the patient included but were not limited to: bleeding which may require transfusion or reoperation; infection which may require antibiotics; injury to bowel, bladder, ureters or other surrounding organs; injury to the fetus; need for additional procedures including hysterectomy in the event of a life-threatening hemorrhage; placental abnormalities wth subsequent pregnancies, incisional problems, thromboembolic phenomenon and other postoperative/anesthesia complications. The patient concurred with the proposed plan, giving informed written consent for the procedure.    FINDINGS:  Viable female infant in cephalic presentation, APGARs 8,9:  Weight pending  Clear amniotic fluid.  Intact placenta, three vessel cord.  Grossly normal uterus, ovaries and fallopian tubes. .   ANESTHESIA:   Epidural ESTIMATED BLOOD LOSS:  900 ml SPECIMENS: Placenta sent to L&D COMPLICATIONS: None immediate  PROCEDURE IN DETAIL:  The patient received intravenous antibiotics and had sequential compression devices applied to her lower extremities while in the preoperative area.  She was then taken to the operating room where epidural anesthesia was dosed up to surgical level and was found to be adequate. She was then placed in a dorsal supine position with a leftward tilt, and prepped and draped in a sterile manner.  A foley catheter was placed into her bladder and attached to constant gravity.  After an adequate timeout was performed, a Pfannenstiel skin incision was made with scalpel and carried  through to the underlying layer of fascia. The fascia was incised in the midline and this incision was extended bilaterally using the Mayo scissors. Kocher clamps were applied to the superior aspect of the fascial incision and the underlying rectus muscles were dissected off bluntly. A similar process was carried out on the inferior aspect of the facial incision. The rectus muscles were separated in the midline bluntly and the peritoneum was entered bluntly.   Bladder flap was created sharply and developed bluntly.  Bladder was protected behind the bladder blade.  A transverse hysterotomy was made with a scalpel and extended bilaterally bluntly. The bladder blade was then removed. The infant was successfully delivered, and cord was clamped and cut and infant was handed over to awaiting neonatology team. Uterine massage was then administered and the placenta delivered intact with three-vessel cord. The uterus was cleared of clot and debris.  The hysterotomy was closed with 0 chromic.  A second imbricating suture of 0-chromic was used to reinforce the incision and aid in hemostasis.  The peritoneum and rectus muscles were noted to be hemostatic and were reapproximated using 3-0 monocryl.  The fascia was closed with 0-Vicryl in a running fashion with good restoration of anatomy.  The subcutaneus tissue was copiously irrigated.  The skin was closed with 4-0 vicryl in a subcuticular fashion.  Pt tolerated the procedure will.  All counts were correct x2.  Pt went to the recovery room in stable condition.

## 2015-06-07 NOTE — Anesthesia Procedure Notes (Signed)
Epidural Patient location during procedure: OB Start time: 06/07/2015 2:25 AM End time: 06/07/2015 2:31 AM  Staffing Anesthesiologist: Lyn Hollingshead Performed by: anesthesiologist   Preanesthetic Checklist Completed: patient identified, surgical consent, pre-op evaluation, timeout performed, IV checked, risks and benefits discussed and monitors and equipment checked  Epidural Patient position: sitting Prep: site prepped and draped and DuraPrep Patient monitoring: continuous pulse ox and blood pressure Approach: midline Location: L3-L4 Injection technique: LOR air  Needle:  Needle type: Tuohy  Needle gauge: 17 G Needle length: 9 cm and 9 Needle insertion depth: 6 cm Catheter type: closed end flexible Catheter size: 19 Gauge Catheter at skin depth: 11 cm Test dose: negative and Other  Assessment Sensory level: T9 Events: blood not aspirated, injection not painful, no injection resistance, negative IV test and no paresthesia  Additional Notes Reason for block:procedure for pain

## 2015-06-07 NOTE — Progress Notes (Signed)
8-9/C/-2 FHT cat one UCs q2-4 min  U/S last week had EFW = 8.2 #  IUPC placed

## 2015-06-07 NOTE — Anesthesia Postprocedure Evaluation (Signed)
Anesthesia Post Note  Patient: Nichole Davis  Procedure(s) Performed: Procedure(s) (LRB): CESAREAN SECTION (N/A)  Patient location during evaluation: Mother Baby Anesthesia Type: Epidural Level of consciousness: awake and alert Pain management: pain level controlled Vital Signs Assessment: post-procedure vital signs reviewed and stable Respiratory status: spontaneous breathing, nonlabored ventilation and respiratory function stable Cardiovascular status: stable Postop Assessment: no headache, no backache, epidural receding, patient able to bend at knees and adequate PO intake Anesthetic complications: no    Last Vitals:  Filed Vitals:   06/07/15 1435 06/07/15 1545  BP: 116/64 120/80  Pulse: 74 89  Temp: 36.6 C 36.5 C  Resp: 18 18    Last Pain:  Filed Vitals:   06/07/15 1632  PainSc: 0-No pain                 Harley Fitzwater

## 2015-06-07 NOTE — Progress Notes (Signed)
Nichole Davis is a 30 y.o. G4P0030 at [redacted]w[redacted]d by ultrasound admitted for active labor  Subjective: No c/o.  Comfortable with epidural.  Objective: BP 128/87 mmHg  Pulse 83  Temp(Src) 98 F (36.7 C) (Oral)  Resp 18  Ht 5\' 2"  (1.575 m)  Wt 201 lb (91.173 kg)  BMI 36.75 kg/m2  SpO2 95%  LMP 08/18/2014      FHT:  FHR: 145 bpm, variability: moderate,  accelerations:  Abscent,  decelerations:  Absent UC:   regular, every 2 minutes SVE:   Dilation: Lip/rim Effacement (%): 90 Station: -1 Exam by:: Dr.Tomblin  Labs: Lab Results  Component Value Date   WBC 14.6* 06/07/2015   HGB 9.8* 06/07/2015   HCT 31.2* 06/07/2015   MCV 79.0 06/07/2015   PLT 232 06/07/2015    Assessment / Plan: Protracted active phase  Labor: IUPC in and pitocin; recheck at 830  Preeclampsia:  n/a Fetal Wellbeing:  Category II Pain Control:  Labor support without medications I/D:  n/a Anticipated MOD:  NSVD  Nichole Davis 06/07/2015, 8:04 AM

## 2015-06-07 NOTE — Transfer of Care (Addendum)
Immediate Anesthesia Transfer of Care Note  Patient: Nichole Davis  Procedure(s) Performed: Procedure(s): CESAREAN SECTION (N/A)  Patient Location: PACU  Anesthesia Type:Epidural  Level of Consciousness: awake, alert , oriented and patient cooperative  Airway & Oxygen Therapy: Patient Spontanous Breathing  Post-op Assessment: Report given to RN and Post -op Vital signs reviewed and stable  Post vital signs: Reviewed and stable  Last Vitals:  TEMP 99.3 BP 118/77 HR 90 RR 13 POX 97  Complications: No apparent anesthesia complications

## 2015-06-07 NOTE — H&P (Signed)
Nichole Davis is a 30 y.o. female presenting for UCs. Maternal Medical History:  Reason for admission: Contractions.   Fetal activity: Perceived fetal activity is normal.      OB History    Gravida Para Term Preterm AB TAB SAB Ectopic Multiple Living   4 0 0  3 0 3   0     Past Medical History  Diagnosis Date  . Anxiety   . GERD (gastroesophageal reflux disease)   . Chest pain     pt. seen at [facility] 07/12/2011, for chest pain, cleared medically, given  Lorazepam & prilosec.  Took Lorazepam & had good relief fr. it, has not taken Prilosec  . PONV (postoperative nausea and vomiting)   . Anemia   . 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 STD (sexually transmitted disease)     trichomonas 8/09, chlamydia 2014  . Vaginal discharge 07/01/2012  . 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 Hx of varicella   . Macrosomia affecting management of mother in third trimester    Past Surgical History  Procedure Laterality Date  . Lymph node biopsy Left 2010    left side neck  . Bone marrow harvest  2005  . Retinal detachment repair w/ scleral buckle le Right 07/17/11    eye  . Scleral buckle  07/17/2011    Procedure: SCLERAL BUCKLE;  Surgeon: Hayden Pedro, MD;  Location: [facility];  Service: Ophthalmology;  Laterality: Right;  . Retinal tear repair cryotherapy Left   . Implanon removal Left     removed 2011, arm  . Dilation and evacuation N/A 11/30/2013    Procedure: DILATATION AND EVACUATION;  Surgeon: Lyman Speller, MD;  Location: [facility];  Service: Gynecology;  Laterality: N/A;  . Laparoscopic sigmoid colectomy N/A 01/28/2014    Procedure: LAPAROSCOPIC SIGMOID COLECTOMY, ;  Surgeon: Michael Boston, MD;  Location: [facility];  Service: General;  Laterality: N/A;  . Proctoscopy N/A 01/28/2014    Procedure: PROCTOSCOPY;  Surgeon: Michael Boston, MD;  Location: [facility];  Service: General;   Laterality: N/A;   Family History: family history includes Leukemia in her brother. There is no history of Anesthesia problems, Colon cancer, Esophageal cancer, Pancreatic cancer, Rectal cancer, or Stomach cancer. Social History:  reports that she has never smoked. She has never used smokeless tobacco. She reports that she drinks alcohol. She reports that she does not use illicit drugs.   Prenatal Transfer Tool  Maternal Diabetes: No Genetic Screening: Normal Maternal Ultrasounds/Referrals: Normal Fetal Ultrasounds or other Referrals:  None Maternal Substance Abuse:  No Significant Maternal Medications:  None Significant Maternal Lab Results:  None Other Comments:  None  Review of Systems  Eyes: Negative for blurred vision.  Gastrointestinal: Negative for abdominal pain.  Neurological: Negative for headaches.    Dilation: 4 Effacement (%): 90 Station: -2 Exam by:: Etter Sjogren RN Blood pressure 137/94, pulse 84, temperature 98.8 F (37.1 C), temperature source Oral, resp. rate 18, height 5\' 2"  (1.575 m), weight 201 lb (91.173 kg), last menstrual period 08/18/2014, SpO2 98 %. Maternal Exam:  Uterine Assessment: Contraction strength is firm.  Contraction frequency is regular.   Abdomen: Fetal presentation: vertex     Fetal Exam Fetal State Assessment: Category I - tracings are normal.     Physical Exam  Cardiovascular: Normal rate and  regular rhythm.   Respiratory: Effort normal.  GI: Soft.  Neurological: She has normal reflexes.    9/C/-1/vtx/BBOW AROM clear  Prenatal labs: ABO, Rh: --/--/A POS (03/20 2224) Antibody: NEG (03/20 2224) Rubella: Immune (08/09 0000) RPR: Nonreactive (08/09 0000)  HBsAg: Negative (08/09 0000)  HIV: Non-reactive (08/09 0000)  GBS: Negative (03/16 0000)   Assessment/Plan: 30 yo G4P0 in active labor   Ariza Evans II,Christell Steinmiller E 06/07/2015, 1:31 AM

## 2015-06-07 NOTE — Progress Notes (Signed)
Original weight and measurements entered incorrectly on the delivery record.  First weight done in the PACU was 8lb 7.7oz (3847g). Weight redone at 1500= 8lb 6.4 oz. (3810g) length=21.25in, head=14.5in, chest=13.25in

## 2015-06-07 NOTE — Progress Notes (Signed)
Cervix now with large and edematous anterior lip, -2 station.  MVUs adequate.  I recommend primary C/S to the patient for arrest of descent.  She is informed of the risk of bleeding, infection, scarring and damage to surrounding structures.  She is also informed of the 1% risk of uterine rupture in subsequent pregnancies.  All questions were answered and the patient wishes to proceed.  Linda Hedges, DO

## 2015-06-07 NOTE — Anesthesia Preprocedure Evaluation (Signed)
Anesthesia Evaluation  Patient identified by MRN, date of birth, ID band Patient awake    Reviewed: Allergy & Precautions, H&P , NPO status , Patient's Chart, lab work & pertinent test results  Airway Mallampati: II  TM Distance: >3 FB Neck ROM: full    Dental no notable dental hx.    Pulmonary neg pulmonary ROS,    Pulmonary exam normal        Cardiovascular negative cardio ROS Normal cardiovascular exam     Neuro/Psych negative neurological ROS     GI/Hepatic Neg liver ROS,   Endo/Other  negative endocrine ROS  Renal/GU negative Renal ROS     Musculoskeletal   Abdominal (+) + obese,   Peds  Hematology   Anesthesia Other Findings   Reproductive/Obstetrics (+) Pregnancy                             Anesthesia Physical Anesthesia Plan  ASA: II  Anesthesia Plan: Epidural   Post-op Pain Management:    Induction:   Airway Management Planned:   Additional Equipment:   Intra-op Plan:   Post-operative Plan:   Informed Consent: I have reviewed the patients History and Physical, chart, labs and discussed the procedure including the risks, benefits and alternatives for the proposed anesthesia with the patient or authorized representative who has indicated his/her understanding and acceptance.     Plan Discussed with:   Anesthesia Plan Comments:         Anesthesia Quick Evaluation

## 2015-06-07 NOTE — Lactation Note (Signed)
This note was copied from a baby's chart. Lactation Consultation Note  Initial visit at 5 hours of age.  Mom reports baby is doing well and is at breast now but not latched.  Lc assisted with cross cradle hold and allowing baby to get STS with mom.  LC assisted with hand expression and colostrum easily flows.  Baby latched well with wide flanged lips and rhythmic sucking bursts.  Mom is sleepy and encouraged to place baby in crib after feeding.  [facility] LC resources given and discussed.  Encouraged to feed with early cues on demand.  Early newborn behavior discussed.  Mom to call for assist as needed.    Patient Name: Nichole Davis [id number] Date: 06/07/2015 Reason for consult: Initial assessment   Maternal Data Has patient been taught Hand Expression?: Yes Does the patient have breastfeeding experience prior to this delivery?: No  Feeding Feeding Type: Breast Fed Length of feed:  (observed several minutes)  LATCH Score/Interventions Latch: Grasps breast easily, tongue down, lips flanged, rhythmical sucking.  Audible Swallowing: A few with stimulation Intervention(s): Skin to skin;Hand expression  Type of Nipple: Everted at rest and after stimulation  Comfort (Breast/Nipple): Soft / non-tender     Hold (Positioning): Assistance needed to correctly position infant at breast and maintain latch. Intervention(s): Breastfeeding basics reviewed;Support Pillows;Position options;Skin to skin  LATCH Score: 8  Lactation Tools Discussed/Used     Consult Status Consult Status: Follow-up Date: 06/08/15 Follow-up type: In-patient    Justice Britain 06/07/2015, 5:04 PM

## 2015-06-08 ENCOUNTER — Encounter (HOSPITAL_COMMUNITY): Payer: Self-pay | Admitting: Obstetrics & Gynecology

## 2015-06-08 LAB — CBC
HCT: 24.3 % — ABNORMAL LOW (ref 36.0–46.0)
HEMOGLOBIN: 8 g/dL — AB (ref 12.0–15.0)
MCH: 26 pg (ref 26.0–34.0)
MCHC: 32.9 g/dL (ref 30.0–36.0)
MCV: 78.9 fL (ref 78.0–100.0)
Platelets: 119 10*3/uL — ABNORMAL LOW (ref 150–400)
RBC: 3.08 MIL/uL — AB (ref 3.87–5.11)
RDW: 16.3 % — ABNORMAL HIGH (ref 11.5–15.5)
WBC: 16.7 10*3/uL — AB (ref 4.0–10.5)

## 2015-06-08 NOTE — Progress Notes (Signed)
Subjective: Postpartum Day 1: Cesarean Delivery Patient reports tolerating PO and + flatus.    Objective: Vital signs in last 24 hours: Temp:  [97.6 F (36.4 C)-99.3 F (37.4 C)] 98.5 F (36.9 C) (03/22 0500) Pulse Rate:  [62-103] 82 (03/22 0500) Resp:  [15-20] 18 (03/22 0500) BP: (106-144)/(52-95) 106/52 mmHg (03/22 0500) SpO2:  [94 %-100 %] 95 % (03/22 0500)  Physical Exam:  General: alert and cooperative Lochia: appropriate Uterine Fundus: firm Incision: healing well DVT Evaluation: No evidence of DVT seen on physical exam. Negative Homan's sign. No cords or calf tenderness. Calf/Ankle edema is present.   Recent Labs  06/07/15 0508 06/08/15 0501  HGB 9.8* 8.0*  HCT 31.2* 24.3*    Assessment/Plan: Status post Cesarean section. Doing well postoperatively.  Cbc ordered for am.  Nichole Davis G 06/08/2015, 8:04 AM

## 2015-06-08 NOTE — Progress Notes (Signed)
Patient has edema in her lower stomach, hands and calf/ankle area.  IV fluids still running from night shift due to barely adequate Urine Output; adequate but barely.  Bp was 104/63 on asssessment.  Patient tolerating PO's, will stop IV fluids and remove foley.    Kingston Cellar RN

## 2015-06-09 ENCOUNTER — Inpatient Hospital Stay (HOSPITAL_COMMUNITY): Payer: BLUE CROSS/BLUE SHIELD

## 2015-06-09 LAB — CBC
HEMATOCRIT: 23.2 % — AB (ref 36.0–46.0)
HEMOGLOBIN: 7.4 g/dL — AB (ref 12.0–15.0)
MCH: 25.2 pg — ABNORMAL LOW (ref 26.0–34.0)
MCHC: 31.9 g/dL (ref 30.0–36.0)
MCV: 78.9 fL (ref 78.0–100.0)
Platelets: 187 10*3/uL (ref 150–400)
RBC: 2.94 MIL/uL — ABNORMAL LOW (ref 3.87–5.11)
RDW: 16.6 % — AB (ref 11.5–15.5)
WBC: 13 10*3/uL — AB (ref 4.0–10.5)

## 2015-06-09 MED ORDER — FERROUS SULFATE 325 (65 FE) MG PO TABS
325.0000 mg | ORAL_TABLET | Freq: Three times a day (TID) | ORAL | Status: DC
  Administered 2015-06-09 – 2015-06-10 (×3): 325 mg via ORAL
  Filled 2015-06-09 (×3): qty 1

## 2015-06-09 NOTE — Lactation Note (Signed)
This note was copied from a baby's chart. Lactation Consultation Note  Patient Name: Nichole Davis Sheils [id number] Date: 06/09/2015 Reason for consult: Follow-up assessment Baby at 80 hr of life and mom reports baby is very sleepy. When he does wake to feed she reports he does well. She denies breast or nipple pain. She has been offering the breast but he will not wake up. Set up DEBP to use if baby does not latch and for post pumping when baby falls asleep after less than 10 minutes of feeding. Encouraged mom to manually express after feedings. Given oral syring, cup, and spoon to feed back her milk. Discussed baby behavior, feeding frequency, pumping, baby belly size, voids, wt loss, breast changes, and nipple care. Mom will offer the breast 8+/24hr and f/u with her expressed milk. She is aware of OP services and support group. She will call as needed for bf help.    Maternal Data    Feeding Feeding Type: Breast Fed Length of feed: 20 min  LATCH Score/Interventions                      Lactation Tools Discussed/Used Pump Review: Setup, frequency, and cleaning;Milk Storage Initiated by:: ES Date initiated:: 06/09/15   Consult Status Consult Status: Follow-up Date: 06/10/15 Follow-up type: In-patient    Denzil Hughes 06/09/2015, 1:38 PM

## 2015-06-09 NOTE — Clinical Documentation Improvement (Signed)
OB/GYN  Please update your documentation within the medical record to reflect your response to this query. Thank you  Can the diagnosis of anemia be further specified?   Acute Blood Loss Anemia  Iron deficiency Anemia  Nutritional anemia, including the nutrition or mineral deficits  Chronic Anemia, including the suspected or known cause  Anemia of chronic disease, including the associated chronic disease state  Other  Clinically Undetermined  Document any associated diagnoses/conditions.  Supporting Information: 06/09/15 progr note.Marland KitchenMarland Kitchen"Status post Cesarean section. Postoperative course complicated by anemia feso4." 06/07/15 Op note..."ESTIMATED BLOOD LOSS: 900 ml"... Results for Nichole Davis, Nichole Davis (MRN [id number]) as of 06/09/2015 13:57  06/06/2015 22:24 06/07/2015 05:08 06/08/2015 05:01 06/09/2015 05:07  Hemoglobin 11.1 (L) 9.8 (L) 8.0 (L) 7.4 (L)  HCT 34.8 (L) 31.2 (L) 24.3 (L) 23.2 (L)   Please exercise your independent, professional judgment when responding. A specific answer is not anticipated or expected.  Thank You,  Ermelinda Das, RN, BSN, [place] Certified Clinical Documentation Specialist [place]: Health Information Management [phone]

## 2015-06-09 NOTE — Progress Notes (Signed)
Subjective: Postpartum Day 2: Cesarean Delivery Patient reports tolerating PO, + flatus and no problems voiding.    Objective: Vital signs in last 24 hours: Temp:  [97.5 F (36.4 C)-98.4 F (36.9 C)] 97.5 F (36.4 C) (03/23 0610) Pulse Rate:  [74-83] 79 (03/23 0610) Resp:  [17-18] 18 (03/23 0610) BP: (104-129)/(63-87) 129/87 mmHg (03/23 0610) SpO2:  [98 %] 98 % (03/22 0915)  Physical Exam:  General: alert and cooperative Lochia: appropriate Uterine Fundus: firm Incision: healing well DVT Evaluation: No evidence of DVT seen on physical exam. Negative Homan's sign. No cords or calf tenderness. Calf/Ankle edema is present.   Recent Labs  06/08/15 0501 06/09/15 0507  HGB 8.0* 7.4*  HCT 24.3* 23.2*    Assessment/Plan: Status post Cesarean section. Postoperative course complicated by anemia  feso4.  Rossie Bretado G 06/09/2015, 7:53 AM

## 2015-06-10 MED ORDER — OXYCODONE-ACETAMINOPHEN 5-325 MG PO TABS: 1.0000 | ORAL_TABLET | ORAL | Status: DC | PRN

## 2015-06-10 MED ORDER — IBUPROFEN 600 MG PO TABS: 600.0000 mg | ORAL_TABLET | Freq: Four times a day (QID) | ORAL | Status: DC

## 2015-06-10 MED ORDER — FERROUS SULFATE 325 (65 FE) MG PO TABS: 325.0000 mg | ORAL_TABLET | Freq: Three times a day (TID) | ORAL | Status: DC

## 2015-06-10 NOTE — Lactation Note (Signed)
This note was copied from a baby's chart. Lactation Consultation Note  Patient Name: Nichole Davis [id number] Date: 06/10/2015 Reason for consult: Follow-up assessment;Infant weight loss;Other (Comment) (12 % weightr loss )  Baby is 3 hours old, and has been to the breast consistently with feeding range - 10 - 40 mins .  Voids and stools adequate for age. @ 60 mins - 9.6. 12 % weight loss .  Per mom breast are heavier, warmer and fuller. When latching feeling some discomfort.  LC assessed both breast with moms permission , no breakdown noted, nipples both erect, and when LC reviewed hand express Milk easily expressed and areola compressible. Baby awake, showing feeding cues. LC assisted mom to feed in football position right breast  Depth achieved and baby fed 14 mins with multiply swallows, and with breast compressions gulps. @ the end of the feeding baby's arms very relaxed.  LC showed mom how to release suction due to baby being non - nutritive.  Sore nipple and engorgement prevention and tx reviewed.  Per mom will have a DEBP Medela at home.  LC recommended to the [facility] RN to re-weigh the baby , weight gain improved greatly and baby going home.  LC went back to check on mom and baby, per mom baby slept after last feeding.  LC reminded mom just to feed baby and it wasn't necessary to supplement.  Mother informed of post-discharge support and given phone number to the lactation department, including services for phone call assistance; out-patient  appointments; and breastfeeding support group. List of other breastfeeding resources in the community given in the handout. Encouraged mother to call  for problems or concerns related to breastfeeding.     Maternal Data Has patient been taught Hand Expression?: Yes (milk is in , steady flow of milk )  Feeding Feeding Type: Breast Fed Length of feed: 14 min (multiply swallows, increased with breast compressions )  LATCH  Score/Interventions Latch: Grasps breast easily, tongue down, lips flanged, rhythmical sucking. Intervention(s): Skin to skin;Teach feeding cues;Waking techniques Intervention(s): Adjust position;Assist with latch;Breast massage;Breast compression  Audible Swallowing: Spontaneous and intermittent  Type of Nipple: Everted at rest and after stimulation  Comfort (Breast/Nipple): Filling, red/small blisters or bruises, mild/mod discomfort  Problem noted: Filling  Hold (Positioning): Assistance needed to correctly position infant at breast and maintain latch. Intervention(s): Breastfeeding basics reviewed;Support Pillows;Position options;Skin to skin  LATCH Score: 8  Lactation Tools Discussed/Used     Consult Status Consult Status: Follow-up Date: 06/10/15 Follow-up type: In-patient    Myer Haff 06/10/2015, 10:50 AM

## 2015-06-10 NOTE — Progress Notes (Signed)
Subjective: Postpartum Day 3: Cesarean Delivery Patient reports tolerating PO, + BM and no problems voiding.  Denies HA, blurred vision or RUQ pain Objective: Vital signs in last 24 hours: Temp:  [98.2 F (36.8 C)-98.6 F (37 C)] 98.2 F (36.8 C) (03/24 0451) Pulse Rate:  [98-119] 98 (03/24 0451) Resp:  [15-18] 15 (03/24 0451) BP: (127-141)/(72-90) 137/90 mmHg (03/24 0451)  Physical Exam:  General: alert and cooperative Lochia: appropriate Uterine Fundus: firm Incision: healing well DVT Evaluation: No evidence of DVT seen on physical exam. Negative Homan's sign. No cords or calf tenderness. Calf/Ankle edema is present. DTR's 1+ no clonus  Recent Labs  06/08/15 0501 06/09/15 0507  HGB 8.0* 7.4*  HCT 24.3* 23.2*    Assessment/Plan: Status post Cesarean section. Postoperative course complicated by anemia and elevation in BP , repeat bp 120/72 Discharge home with standard precautions and return to clinic in 4-5 day .  Nichole Davis G 06/10/2015, 6:45 AM

## 2015-06-10 NOTE — Discharge Summary (Signed)
Obstetric Discharge Summary Reason for Admission: onset of labor Prenatal Procedures: ultrasound Intrapartum Procedures: cesarean: low cervical, transverse Postpartum Procedures: none Complications-Operative and Postpartum: none HEMOGLOBIN  Date Value Ref Range Status  06/09/2015 7.4* 12.0 - 15.0 g/dL Final  04/10/2012 11.7* 12.2 - 16.2 g/dL Final   HCT  Date Value Ref Range Status  06/09/2015 23.2* 36.0 - 46.0 % Final   HCT, POC  Date Value Ref Range Status  04/10/2012 39.2 37.7 - 47.9 % Final    Physical Exam:  General: alert and cooperative Lochia: appropriate Uterine Fundus: firm Incision: healing well DVT Evaluation: No evidence of DVT seen on physical exam. Negative Homan's sign. No cords or calf tenderness. Calf/Ankle edema is present.  Discharge Diagnoses: Term Pregnancy-delivered  Discharge Information: Date: 06/10/2015 Activity: pelvic rest Diet: routine Medications: PNV, Ibuprofen, Iron and Percocet Condition: stable Instructions: refer to practice specific booklet Discharge to: home   Newborn Data: Live born female  Birth Weight: 8 lb 7.7 oz (3847 g) APGAR: 8, 9  Home with mother.  [place] G 06/10/2015, 6:49 AM

## 2015-06-24 DIAGNOSIS — N760 Acute vaginitis: Secondary | ICD-10-CM | POA: Diagnosis not present

## 2015-07-06 NOTE — Addendum Note (Signed)
Addendum  created 07/06/15 1041 by Lyndle Herrlich, MD   Modules edited: Anesthesia Responsible Staff

## 2015-08-10 DIAGNOSIS — Z30430 Encounter for insertion of intrauterine contraceptive device: Secondary | ICD-10-CM | POA: Diagnosis not present

## 2015-09-30 ENCOUNTER — Emergency Department (HOSPITAL_COMMUNITY): Payer: BLUE CROSS/BLUE SHIELD

## 2015-09-30 ENCOUNTER — Emergency Department (HOSPITAL_COMMUNITY)
Admission: EM | Admit: 2015-09-30 | Discharge: 2015-10-01 | Disposition: A | Discharge status: Home / Self Care | Payer: BLUE CROSS/BLUE SHIELD | Attending: Emergency Medicine | Admitting: Emergency Medicine

## 2015-09-30 ENCOUNTER — Encounter (HOSPITAL_COMMUNITY): Payer: Self-pay | Admitting: Oncology

## 2015-09-30 DIAGNOSIS — R51 Headache: Secondary | ICD-10-CM | POA: Diagnosis not present

## 2015-09-30 DIAGNOSIS — R519 Headache, unspecified: Secondary | ICD-10-CM

## 2015-09-30 LAB — CBC WITH DIFFERENTIAL/PLATELET
Basophils Absolute: 0 10*3/uL (ref 0.0–0.1)
Basophils Relative: 0 %
EOS ABS: 0.2 10*3/uL (ref 0.0–0.7)
EOS PCT: 2 %
HEMATOCRIT: 38.5 % (ref 36.0–46.0)
HEMOGLOBIN: 12.2 g/dL (ref 12.0–15.0)
Lymphocytes Relative: 25 %
Lymphs Abs: 2.2 10*3/uL (ref 0.7–4.0)
MCH: 24.9 pg — AB (ref 26.0–34.0)
MCHC: 31.7 g/dL (ref 30.0–36.0)
MCV: 78.6 fL (ref 78.0–100.0)
MONO ABS: 0.6 10*3/uL (ref 0.1–1.0)
Monocytes Relative: 6 %
Neutro Abs: 5.8 10*3/uL (ref 1.7–7.7)
Neutrophils Relative %: 67 %
Platelets: 273 10*3/uL (ref 150–400)
RBC: 4.9 MIL/uL (ref 3.87–5.11)
RDW: 15.8 % — ABNORMAL HIGH (ref 11.5–15.5)
WBC: 8.7 10*3/uL (ref 4.0–10.5)

## 2015-09-30 LAB — I-STAT BETA HCG BLOOD, ED (MC, WL, AP ONLY)

## 2015-09-30 LAB — I-STAT CHEM 8, ED
BUN: 19 mg/dL (ref 6–20)
CHLORIDE: 102 mmol/L (ref 101–111)
CREATININE: 0.7 mg/dL (ref 0.44–1.00)
Calcium, Ion: 1.21 mmol/L (ref 1.13–1.30)
GLUCOSE: 98 mg/dL (ref 65–99)
HCT: 39 % (ref 36.0–46.0)
Hemoglobin: 13.3 g/dL (ref 12.0–15.0)
Potassium: 6.1 mmol/L — ABNORMAL HIGH (ref 3.5–5.1)
Sodium: 138 mmol/L (ref 135–145)
TCO2: 30 mmol/L (ref 0–100)

## 2015-09-30 MED ORDER — DEXAMETHASONE SODIUM PHOSPHATE 10 MG/ML IJ SOLN
10.0000 mg | Freq: Once | INTRAMUSCULAR | Status: AC
  Administered 2015-09-30: 10 mg via INTRAVENOUS
  Filled 2015-09-30: qty 1

## 2015-09-30 MED ORDER — SODIUM CHLORIDE 0.9 % IV BOLUS (SEPSIS)
1000.0000 mL | Freq: Once | INTRAVENOUS | Status: AC
  Administered 2015-09-30: 1000 mL via INTRAVENOUS

## 2015-09-30 MED ORDER — METOCLOPRAMIDE HCL 5 MG/ML IJ SOLN
10.0000 mg | Freq: Once | INTRAMUSCULAR | Status: AC
  Administered 2015-09-30: 10 mg via INTRAVENOUS
  Filled 2015-09-30 (×2): qty 2

## 2015-09-30 MED ORDER — DIPHENHYDRAMINE HCL 50 MG/ML IJ SOLN
25.0000 mg | Freq: Once | INTRAMUSCULAR | Status: AC
  Administered 2015-09-30: 25 mg via INTRAVENOUS
  Filled 2015-09-30: qty 1

## 2015-09-30 NOTE — ED Provider Notes (Signed)
CSN: [id number]     Arrival date & time 09/30/15  1950 History   First MD Initiated Contact with Patient 09/30/15 2152     Chief Complaint  Patient presents with  . Headache     (Consider location/radiation/quality/duration/timing/severity/associated sxs/prior Treatment) HPI Comments: Patient presents to the ED with a chief complaint of headache.  She states that she has had constant daily headache x 3-4 weeks.  She states that she has tried taking tylenol and ibuprofen for her symptoms with moderate relief.  She reports having had nausea and vomiting and some lightheadedness today.  She thinks that these symptoms are because she hasn't been eating and drinking enough today.  She denies any fevers or chills.  She denies any neck stiffness.  She denies ever having imaging of her head.  The history is provided by the patient. No language interpreter was used.    Past Medical History  Diagnosis Date  . Anxiety   . GERD (gastroesophageal reflux disease)   . Chest pain     pt. seen at [facility] 07/12/2011, for chest pain, cleared medically, given  Lorazepam & prilosec.  Took Lorazepam & had good relief fr. it, has not taken Prilosec  . PONV (postoperative nausea and vomiting)   . Anemia   . 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 STD (sexually transmitted disease)     trichomonas 8/09, chlamydia 2014  . Vaginal discharge 07/01/2012  . 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 Hx of varicella   . Macrosomia affecting management of mother in third trimester    Past Surgical History  Procedure Laterality Date  . Lymph node biopsy Left 2010    left side neck  . Bone marrow harvest  2005  . Retinal detachment repair w/ scleral buckle le Right 07/17/11    eye  . Scleral buckle  07/17/2011    Procedure: SCLERAL BUCKLE;  Surgeon: Hayden Pedro, MD;  Location: [facility];  Service: Ophthalmology;   Laterality: Right;  . Retinal tear repair cryotherapy Left   . Implanon removal Left     removed 2011, arm  . Dilation and evacuation N/A 11/30/2013    Procedure: DILATATION AND EVACUATION;  Surgeon: Lyman Speller, MD;  Location: [facility];  Service: Gynecology;  Laterality: N/A;  . Laparoscopic sigmoid colectomy N/A 01/28/2014    Procedure: LAPAROSCOPIC SIGMOID COLECTOMY, ;  Surgeon: Michael Boston, MD;  Location: [facility];  Service: General;  Laterality: N/A;  . Proctoscopy N/A 01/28/2014    Procedure: PROCTOSCOPY;  Surgeon: Michael Boston, MD;  Location: [facility];  Service: General;  Laterality: N/A;  . Cesarean section N/A 06/07/2015    Procedure: CESAREAN SECTION;  Surgeon: Linda Hedges, DO;  Location: [facility];  Service: Obstetrics;  Laterality: N/A;   Family History  Problem Relation Age of Onset  . Anesthesia problems Neg Hx   . Colon cancer Neg Hx   . Esophageal cancer Neg Hx   . Pancreatic cancer Neg Hx   . Rectal cancer Neg Hx   . Stomach cancer Neg Hx   . Leukemia Brother    Social History  Substance Use Topics  . Smoking status: Never Smoker   . Smokeless tobacco: Never Used  . Alcohol Use: Yes     Comment: socially   OB History    Gravida Para Term Preterm  AB TAB SAB Ectopic Multiple Living   4 1 1  3  0 3  0 1     Review of Systems  Constitutional: Negative for fever and chills.  Respiratory: Negative for shortness of breath.   Cardiovascular: Negative for chest pain.  Gastrointestinal: Positive for vomiting. Negative for nausea, diarrhea and constipation.  Genitourinary: Negative for dysuria.  Neurological: Positive for headaches.      Allergies  Review of patient's allergies indicates no known allergies.  Home Medications   Prior to Admission medications   Medication Sig Start Date End Date Taking? Authorizing Provider  ferrous sulfate 325 (65 FE) MG tablet Take 1 tablet (325 mg total) by mouth 3 (three) times daily with meals. 06/10/15   Juanda Chance, NP   ibuprofen (ADVIL,MOTRIN) 600 MG tablet Take 1 tablet (600 mg total) by mouth every 6 (six) hours. 06/10/15   Juanda Chance, NP  oxyCODONE-acetaminophen (PERCOCET/ROXICET) 5-325 MG tablet Take 1 tablet by mouth every 4 (four) hours as needed (pain scale 4-7). 06/10/15   Juanda Chance, NP  Prenatal Vit-Fe Fumarate-FA (PRENATAL MULTIVITAMIN) TABS tablet Take 1 tablet by mouth daily at 12 noon.    Historical Provider, MD   BP 119/88 mmHg  Pulse 84  Temp(Src) 98.3 F (36.8 C) (Oral)  Resp 14  Ht 5\' 1"  (1.549 m)  Wt 67.132 kg  BMI 27.98 kg/m2  SpO2 100% Physical Exam  Constitutional: She is oriented to person, place, and time. She appears well-developed and well-nourished.  HENT:  Head: Normocephalic and atraumatic.  Right Ear: External ear normal.  Left Ear: External ear normal.  Eyes: Conjunctivae and EOM are normal. Pupils are equal, round, and reactive to light.  Neck: Normal range of motion. Neck supple.  No pain with neck flexion, no meningismus  Cardiovascular: Normal rate, regular rhythm and normal heart sounds.  Exam reveals no gallop and no friction rub.   No murmur heard. Pulmonary/Chest: Effort normal and breath sounds normal. No respiratory distress. She has no wheezes. She has no rales. She exhibits no tenderness.  Abdominal: Soft. She exhibits no distension and no mass. There is no tenderness. There is no rebound and no guarding.  Musculoskeletal: Normal range of motion. She exhibits no edema or tenderness.  Normal gait.  Neurological: She is alert and oriented to person, place, and time. She has normal reflexes.  CN 3-12 intact, normal finger to nose, no pronator drift, sensation and strength intact bilaterally.  Skin: Skin is warm and dry.  Psychiatric: She has a normal mood and affect. Her behavior is normal. Judgment and thought content normal.  Nursing note and vitals reviewed.   ED Course  Procedures (including critical care time) Results for orders placed or  performed during the hospital encounter of 09/30/15  CBC with Differential/Platelet  Result Value Ref Range   WBC 8.7 4.0 - 10.5 K/uL   RBC 4.90 3.87 - 5.11 MIL/uL   Hemoglobin 12.2 12.0 - 15.0 g/dL   HCT 38.5 36.0 - 46.0 %   MCV 78.6 78.0 - 100.0 fL   MCH 24.9 (L) 26.0 - 34.0 pg   MCHC 31.7 30.0 - 36.0 g/dL   RDW 15.8 (H) 11.5 - 15.5 %   Platelets 273 150 - 400 K/uL   Neutrophils Relative % 67 %   Neutro Abs 5.8 1.7 - 7.7 K/uL   Lymphocytes Relative 25 %   Lymphs Abs 2.2 0.7 - 4.0 K/uL   Monocytes Relative 6 %   Monocytes Absolute 0.6 0.1 -  1.0 K/uL   Eosinophils Relative 2 %   Eosinophils Absolute 0.2 0.0 - 0.7 K/uL   Basophils Relative 0 %   Basophils Absolute 0.0 0.0 - 0.1 K/uL  I-stat chem 8, ed  Result Value Ref Range   Sodium 138 135 - 145 mmol/L   Potassium 6.1 (H) 3.5 - 5.1 mmol/L   Chloride 102 101 - 111 mmol/L   BUN 19 6 - 20 mg/dL   Creatinine, Ser 0.70 0.44 - 1.00 mg/dL   Glucose, Bld 98 65 - 99 mg/dL   Calcium, Ion 1.21 1.13 - 1.30 mmol/L   TCO2 30 0 - 100 mmol/L   Hemoglobin 13.3 12.0 - 15.0 g/dL   HCT 39.0 36.0 - 46.0 %  I-Stat Beta hCG blood, ED ([facility], [facility], [facility] only)  Result Value Ref Range   I-stat hCG, quantitative <5.0 <5 mIU/mL   Comment 3           Ct Head Wo Contrast  09/30/2015  CLINICAL DATA:  Waking up with headaches for the last month. EXAM: CT HEAD WITHOUT CONTRAST TECHNIQUE: Contiguous axial images were obtained from the base of the skull through the vertex without intravenous contrast. COMPARISON:  None. FINDINGS: There is no evidence for acute hemorrhage, hydrocephalus, mass lesion, or abnormal extra-axial fluid collection. No definite CT evidence for acute infarction. The visualized paranasal sinuses and mastoid air cells are clear. Evidence of previous right eye surgery noted. IMPRESSION: Normal CT evaluation of the brain. Electronically Signed   By: Misty Stanley M.D.   On: 09/30/2015 23:40    I have personally reviewed and evaluated these  images and lab results as part of my medical decision-making.    MDM   Final diagnoses:  Nonintractable headache, unspecified chronicity pattern, unspecified headache type    Given length of symptoms, will check CT head to rule out mass.  Will treat with fluids.  K is 6.1, suspect hemolysis.  12:27 AM Patient states that she feels significantly improved.    CT is negative for mass or any other abnormality.    Pt HA treated and improved while in ED.  Presentation is non concerning for [facility], ICH, Meningitis. Pt is afebrile with no focal neuro deficits, nuchal rigidity, or change in vision. Pt is to follow up with PCP to discuss prophylactic medication. Pt verbalizes understanding and is agreeable with plan to dc.    Montine Circle, PA-C 10/01/15 0031  Daleen Bo, MD 10/01/15 [phone]

## 2015-09-30 NOTE — ED Notes (Signed)
Pt reports waking up w/ headaches x 1 month.  Also reports poor PO intake.  Pt also reports coughing them vomiting.  Pt is in NAD at this time.

## 2015-09-30 NOTE — ED Notes (Signed)
Bed: [id number] Expected date:  Expected time:  Means of arrival:  Comments: Rm 27

## 2015-09-30 NOTE — ED Notes (Signed)
Pt gone to CT 

## 2015-10-01 NOTE — Discharge Instructions (Signed)

## 2015-11-10 IMAGING — CT CT ENTEROGRAPHY (ABD-PELV W/ CM)
2 of 5 series · 11 of 36 positions shown, 18 images · IV contrast ([ID] [id number] & [ID] [id number])
Comparison: Pelvic MRI 09/12/2013.  Pelvic ultrasound 09/01/2013

CLINICAL DATA: Evaluate pelvic mass.

EXAM:
CT ABDOMEN AND PELVIS WITH CONTRAST (ENTEROGRAPHY)
TECHNIQUE: Multidetector CT of the abdomen and pelvis during bolus
administration of intravenous contrast. Negative oral contrast
VoLumen was given.
CONTRAST:  100mL OMNIPAQUE IOHEXOL 300 MG/ML  SOLN

[Series 3: enterography (id) · axial · 0.76mm/px · z∈[-408,-48]mm · 10 of 174 slices shown, 16 images]
[im 15/174  soft-tissue]
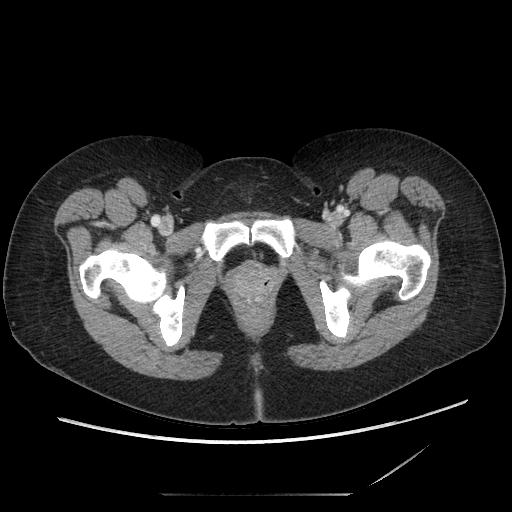
[im 15/174  bone]
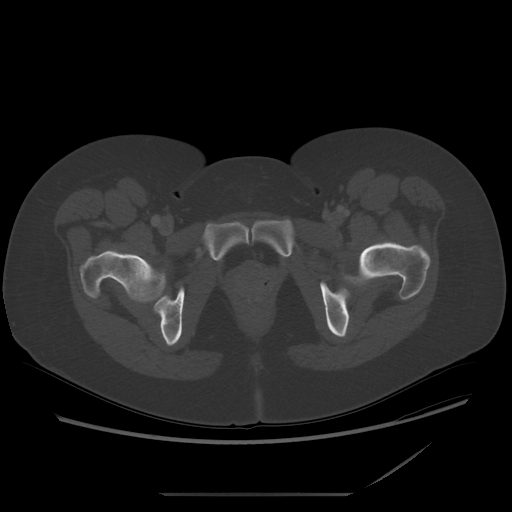
[im 29/174  soft-tissue]
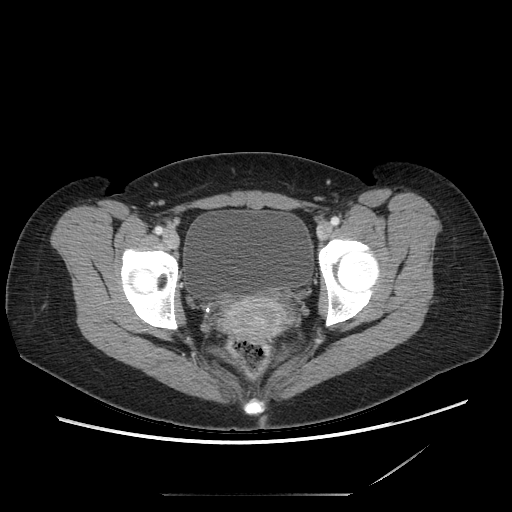
[im 44/174  soft-tissue]
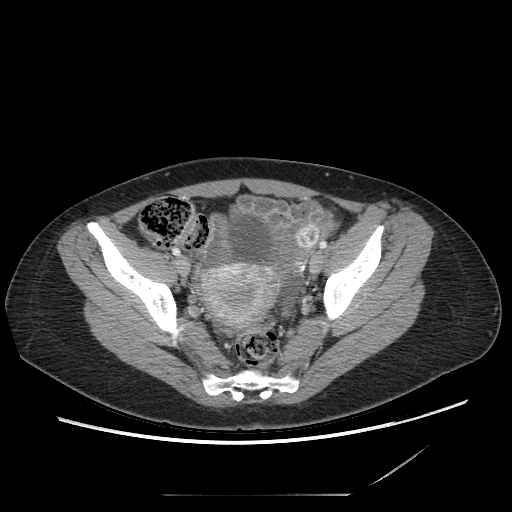
[im 58/174  soft-tissue]
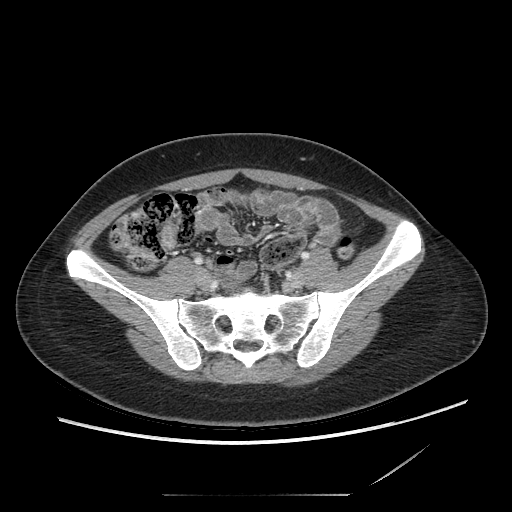
[im 73/174  soft-tissue]
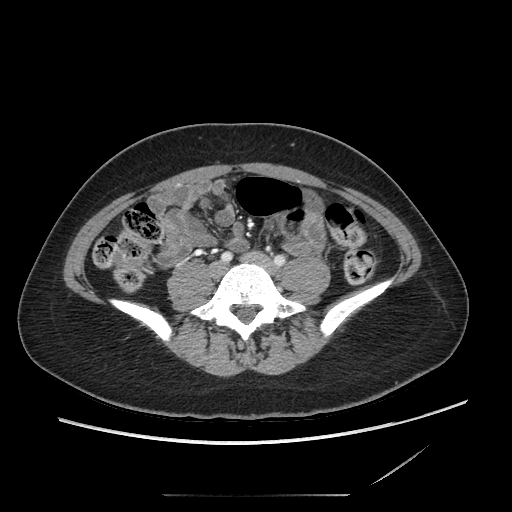
[im 101/174  soft-tissue]
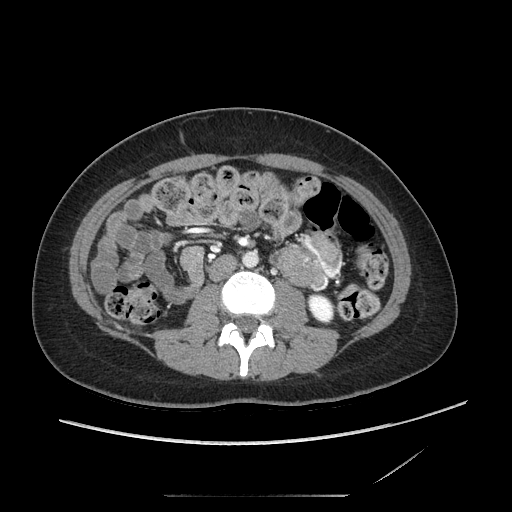
[im 116/174  soft-tissue]
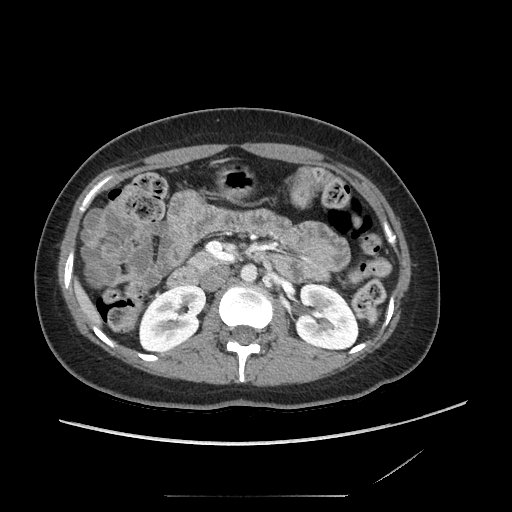
[im 116/174  lung]
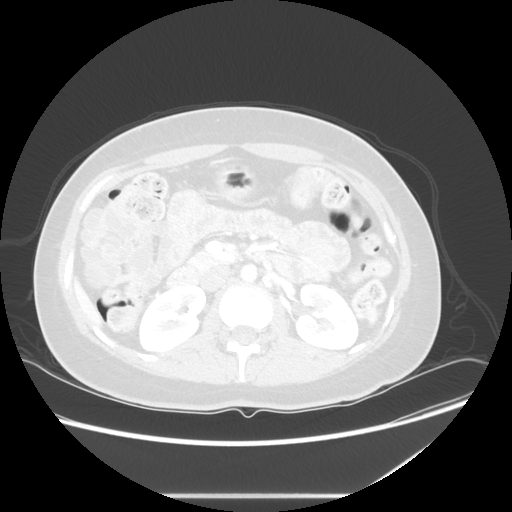
[im 130/174  soft-tissue]
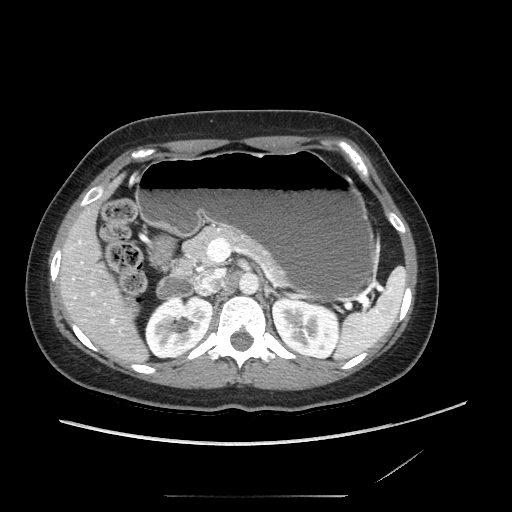
[im 130/174  lung]
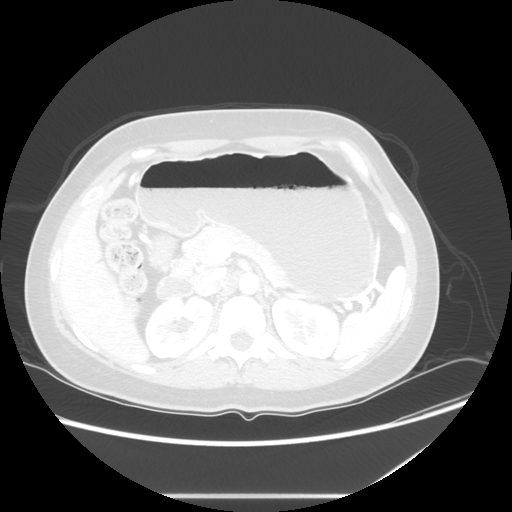
[im 145/174  soft-tissue]
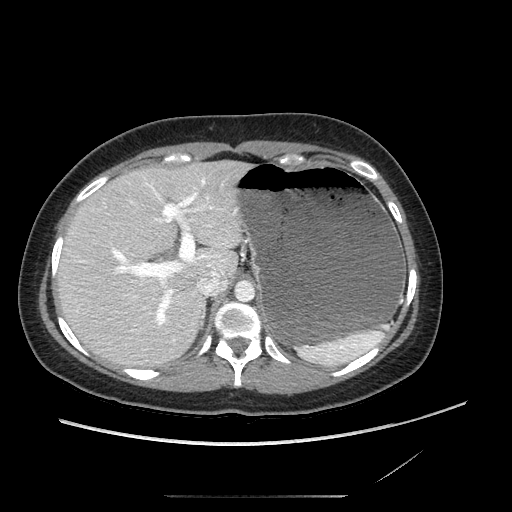
[im 145/174  lung]
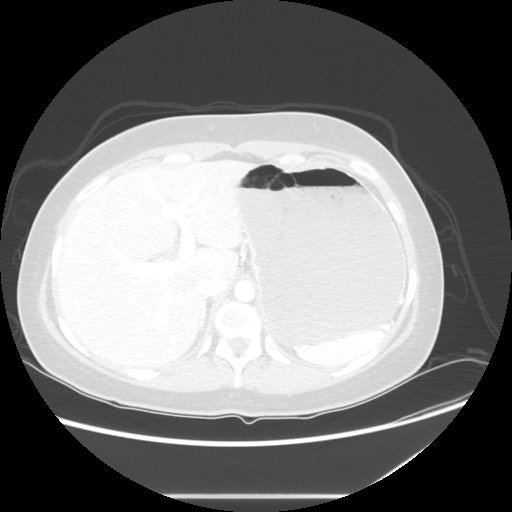
[im 145/174  bone]
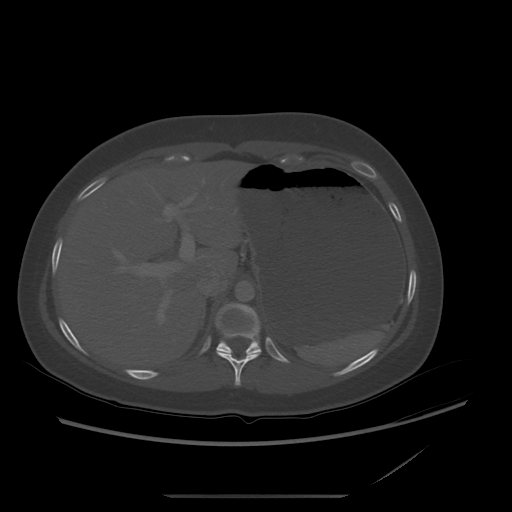
[im 159/174  soft-tissue]
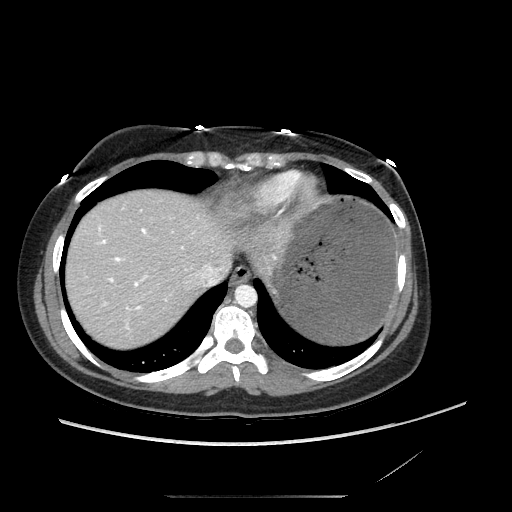
[im 159/174  lung]
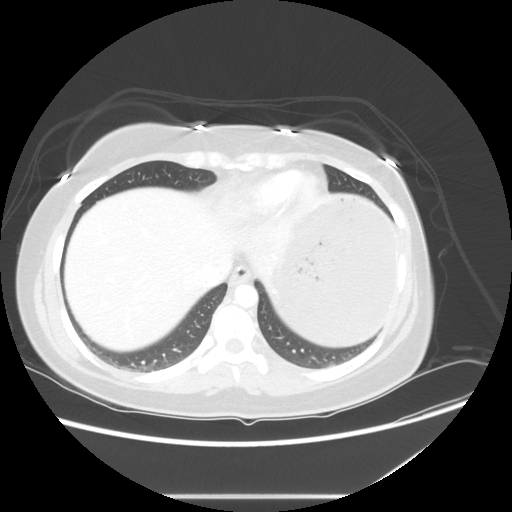

[Series 601: coronal body · coronal · 1.00mm/px · 1 of 120 slices shown, 2 images]
[im 40/120  soft-tissue]
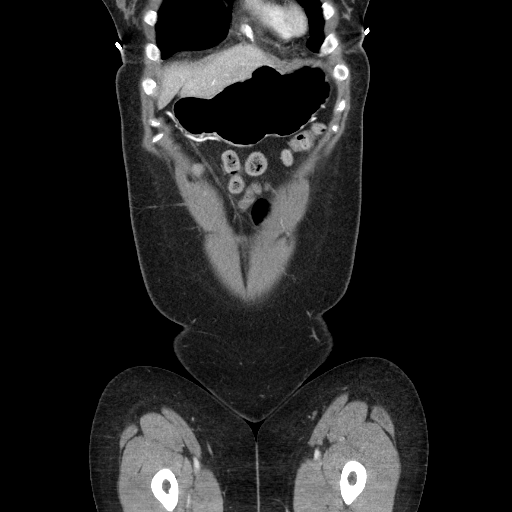
[im 40/120  bone]
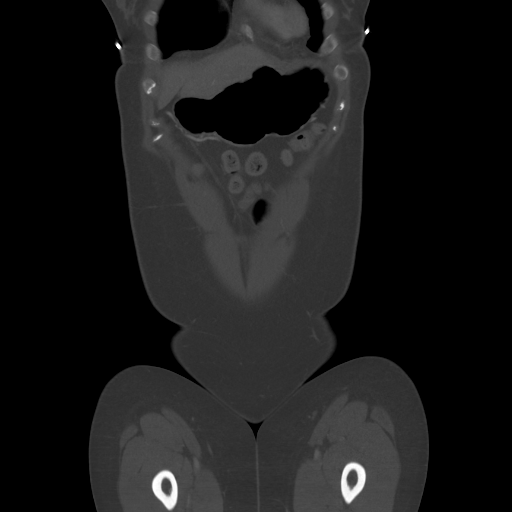

[11 of 36 positions shown; findings below may reference images not displayed]

FINDINGS: Lung bases are clear.  No effusions.  Heart is normal size.

Liver, gallbladder, spleen, pancreas, adrenals and kidneys are
normal.

The previously described pelvic mass by MRI is not visualized in the
pelvis in the same location as seen on prior MRI. However, similar
shaped and size mass is seen in the left mid to upper abdomen and
appears to be intimately associated with a redundant sigmoid colon,
which is presumably on a floppy mesentery. On coronal image 68, this
measures 5.8 x 4.1 cm. This is seen on axial image 72 measuring
x 4.0 cm. This does not appear to be related to the small bowel.
Small bowel is normal caliber. No abnormal areas of enhancement. No
small bowel stricture.

Large stool burden noted throughout the colon.

Liver, gallbladder, spleen, pancreas, adrenals and kidneys are
normal. Aorta is normal caliber.

Small rim enhancing corpus luteal cyst in the left ovary. Right
ovary and adnexa unremarkable. No free fluid, free air or
adenopathy. Aorta is normal caliber.

No acute bony abnormality or focal bone lesion.
IMPRESSION: The previously seen pelvic mass by ultrasound and MRI appears to
have migrated into the left mid to upper abdomen. This appears to be
associated with a redundant sigmoid colon, presumably on a floppy
mesentery. While this does not appear to be obstructing, there is a
large volume of stool throughout the colon proximal to this lesion.
Differential considerations would include GI stromal tumor,
lymphoma, primary adenocarcinoma of the sigmoid colon. Recommend
further evaluation with colonoscopy.

These results will be called to the ordering clinician or
representative by the Radiologist Assistant, and communication
documented in the PACS or zVision Dashboard.

## 2015-12-12 DIAGNOSIS — S0990XA Unspecified injury of head, initial encounter: Secondary | ICD-10-CM | POA: Diagnosis not present

## 2015-12-22 DIAGNOSIS — Z113 Encounter for screening for infections with a predominantly sexual mode of transmission: Secondary | ICD-10-CM | POA: Diagnosis not present

## 2015-12-22 DIAGNOSIS — Z1159 Encounter for screening for other viral diseases: Secondary | ICD-10-CM | POA: Diagnosis not present

## 2015-12-22 DIAGNOSIS — Z114 Encounter for screening for human immunodeficiency virus [HIV]: Secondary | ICD-10-CM | POA: Diagnosis not present

## 2015-12-22 DIAGNOSIS — N760 Acute vaginitis: Secondary | ICD-10-CM | POA: Diagnosis not present

## 2015-12-28 ENCOUNTER — Telehealth: Payer: Self-pay | Admitting: Nurse Practitioner

## 2015-12-28 NOTE — Telephone Encounter (Signed)
Patient has some questions about some past STD results.

## 2015-12-28 NOTE — Telephone Encounter (Signed)
Spoke with patient. Patient request to review STD labs done 08/21/13 and 03/15/14, reviewed as seen below. Patient states she did not know if all STD testing had been done at that time. Patient states she went to Urgent Care for STD testing 12/23/15 due to concern of previous STD. Patient asking if our office test for Herpes. Advised if outbreak of herpes or concern of herpes we can test. Asked patient if she had an outbreak or concern, patient denied. Advised if patient had concerns of STD she would need to schedule appointment for OV for further evaluation. Patient declined an OV at this time, patient states she would like to wait on results from urgent care. Advised to call back with any questions or concerns. Patient verbalizes understanding and is agreeable.    Notes Recorded by Danie Binder, CMA on 08/28/2013 at 10:28 AM Results released to mychart. 02 recall in for 1 yr ------ Notes Recorded by Regina Eck, CNM on 08/28/2013 at 8:32 AM Notify GC,Chlamydia, HIV, RPR negative Pap smear reviewed negative 02 Notes Recorded by Karen Chafe, RN on 03/22/2014 at 11:58 AM Patient aware of results. See telephone encounter 03/22/14.   ------Notes Recorded by Regina Eck, CNM on 03/19/2014 at 9:03 AM Notify patient that Chlamydia is positive. Will need Rx Azithromycin 1 gm order in. Stress no unprotected sexual activity. Will need TOC 3 months please schedule. Partner will need to be notified and treated.

## 2016-01-06 DIAGNOSIS — Z23 Encounter for immunization: Secondary | ICD-10-CM | POA: Diagnosis not present

## 2016-01-16 ENCOUNTER — Encounter: Payer: Self-pay | Admitting: Internal Medicine

## 2016-02-08 DIAGNOSIS — Z0000 Encounter for general adult medical examination without abnormal findings: Secondary | ICD-10-CM | POA: Diagnosis not present

## 2016-03-13 ENCOUNTER — Telehealth: Payer: Self-pay | Admitting: *Deleted

## 2016-03-13 NOTE — Telephone Encounter (Signed)
Pt no show for PV 03/13/16. Unable to reschedule appointment right now for PV but does not wish to have colon cancelled 03/23/16. She will call back on her lunch hour with her calendar to reschedule PV.

## 2016-03-14 NOTE — Telephone Encounter (Signed)
Pt cancelled both PV and colonoscopy

## 2016-03-23 ENCOUNTER — Encounter: Payer: BLUE CROSS/BLUE SHIELD | Admitting: Internal Medicine

## 2016-04-20 DIAGNOSIS — Z113 Encounter for screening for infections with a predominantly sexual mode of transmission: Secondary | ICD-10-CM | POA: Diagnosis not present

## 2016-04-20 DIAGNOSIS — N760 Acute vaginitis: Secondary | ICD-10-CM | POA: Diagnosis not present

## 2016-05-31 ENCOUNTER — Ambulatory Visit (INDEPENDENT_AMBULATORY_CARE_PROVIDER_SITE_OTHER): Payer: BLUE CROSS/BLUE SHIELD

## 2016-05-31 ENCOUNTER — Ambulatory Visit (INDEPENDENT_AMBULATORY_CARE_PROVIDER_SITE_OTHER): Payer: BLUE CROSS/BLUE SHIELD | Admitting: Family Medicine

## 2016-05-31 VITALS — BP 110/76 | HR 90 | Temp 98.6°F | Resp 14 | Ht 61.0 in | Wt 168.0 lb

## 2016-05-31 DIAGNOSIS — R05 Cough: Secondary | ICD-10-CM | POA: Diagnosis not present

## 2016-05-31 DIAGNOSIS — R059 Cough, unspecified: Secondary | ICD-10-CM

## 2016-05-31 DIAGNOSIS — J209 Acute bronchitis, unspecified: Secondary | ICD-10-CM | POA: Diagnosis not present

## 2016-05-31 MED ORDER — ALBUTEROL SULFATE HFA 108 (90 BASE) MCG/ACT IN AERS: 2.0000 | INHALATION_SPRAY | Freq: Four times a day (QID) | RESPIRATORY_TRACT | 0 refills | Status: DC | PRN

## 2016-05-31 MED ORDER — PREDNISONE 20 MG PO TABS: 40.0000 mg | ORAL_TABLET | Freq: Every day | ORAL | 0 refills | Status: DC

## 2016-05-31 NOTE — Patient Instructions (Addendum)
It was good to meet you today.  You do have the starts of bronchitis on your chest x-ray.  Take the prednisone 2 pills a day for the next 5 days.  Use the inhaler every 4-6 hours if you need it for cough, trouble breathing, or wheezing while you're taking the prednisone. After that just take it as needed if you notice to treat having any worsening cough.  As we talked about sometimes the cough of bronchitis can last for several weeks. We are going to try and treat this to make sure this doesn't happen.  If you start having any fevers or chills or not feeling better please come back and see [place].    IF you received an x-ray today, you will receive an invoice from [facility]. Please contact [facility] at [phone] with questions or concerns regarding your invoice.   IF you received labwork today, you will receive an invoice from [place]. Please contact [facility] at [phone] with questions or concerns regarding your invoice.   Our billing staff will not be able to assist you with questions regarding bills from these companies.  You will be contacted with the lab results as soon as they are available. The fastest way to get your results is to activate your My Chart account. Instructions are located on the last page of this paperwork. If you have not heard from [place] regarding the results in 2 weeks, please contact this office.

## 2016-05-31 NOTE — Progress Notes (Signed)
Nichole Davis is a 31 y.o. female who presents to Primary Care at [facility] today for cough and sore throat:  1.  Cough and sore throat:   Present for the past 3-4 days. She has had several family members as sick contacts with similar symptoms. Her son was recently sent home with a nebulizer for wheezing. She herself has never had any wheezing or told she has a asthma in the past.  For the past several days her sore throat has gotten worse. She is also now developed a cough. Cough is worse today. His little hoarse today as well. She's been trying Mucinex, DayQuil, NyQuil with minimal relief. She does have some pleuritic pain with coughing. No chest pain on exertion. No palpitations. No fevers or chills. It is also sometimes painful when she takes deep breath and this forces her to cough.  She does have some coryza. She has been having worsening trouble with seasonal allergies for the past 2-3 years.  No other recent illnesses or medical concerns.  ROS as above.  Pertinently, no chest pain, palpitations, SOB.Marland Kitchen   PMH reviewed. Patient is a nonsmoker.   Past Medical History:  Diagnosis Date  . Anemia   . Anxiety   . Bone marrow donor    for brother  . Chest pain    pt. seen at [facility] 07/12/2011, for chest pain, cleared medically, given  Lorazepam & prilosec.  Took Lorazepam & had good relief fr. it, has not taken Prilosec  . GERD (gastroesophageal reflux disease)   . Hx of varicella   . Macrosomia affecting management of mother in third trimester   . PONV (postoperative nausea and vomiting)   . 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 STD (sexually transmitted disease)    trichomonas 8/09, chlamydia 2014  . Vaginal discharge 07/01/2012   Past Surgical History:  Procedure Laterality Date  . BONE MARROW HARVEST  2005  . CESAREAN SECTION N/A 06/07/2015   Procedure: CESAREAN SECTION;   Surgeon: Linda Hedges, DO;  Location: [facility];  Service: Obstetrics;  Laterality: N/A;  . DILATION AND EVACUATION N/A 11/30/2013   Procedure: DILATATION AND EVACUATION;  Surgeon: Lyman Speller, MD;  Location: [facility];  Service: Gynecology;  Laterality: N/A;  . implanon removal Left    removed 2011, arm  . LAPAROSCOPIC SIGMOID COLECTOMY N/A 01/28/2014   Procedure: LAPAROSCOPIC SIGMOID COLECTOMY, ;  Surgeon: Michael Boston, MD;  Location: [facility];  Service: General;  Laterality: N/A;  . LYMPH NODE BIOPSY Left 2010   left side neck  . PROCTOSCOPY N/A 01/28/2014   Procedure: PROCTOSCOPY;  Surgeon: Michael Boston, MD;  Location: [facility];  Service: General;  Laterality: N/A;  . RETINAL DETACHMENT REPAIR W/ SCLERAL BUCKLE LE Right 07/17/11   eye  . RETINAL TEAR REPAIR CRYOTHERAPY Left   . SCLERAL BUCKLE  07/17/2011   Procedure: SCLERAL BUCKLE;  Surgeon: Hayden Pedro, MD;  Location: [facility];  Service: Ophthalmology;  Laterality: Right;    Medications reviewed. Current Outpatient Prescriptions  Medication Sig Dispense Refill  . ferrous sulfate 325 (65 FE) MG tablet Take 1 tablet (325 mg total) by mouth 3 (three) times daily with meals. (Patient not taking: Reported on 05/31/2016) 90 tablet 3  . ibuprofen (ADVIL,MOTRIN) 600 MG tablet Take 1 tablet (600 mg total) by mouth every 6 (six) hours. (Patient not taking: Reported  on 05/31/2016) 30 tablet 1  . oxyCODONE-acetaminophen (PERCOCET/ROXICET) 5-325 MG tablet Take 1 tablet by mouth every 4 (four) hours as needed (pain scale 4-7). (Patient not taking: Reported on 05/31/2016) 30 tablet 0  . Prenatal Vit-Fe Fumarate-FA (PRENATAL MULTIVITAMIN) TABS tablet Take 1 tablet by mouth daily at 12 noon.     No current facility-administered medications for this visit.      Physical Exam:  BP 110/76   Pulse 90   Temp 98.6 F (37 C) (Oral)   Resp 14   Ht 5\' 1"  (1.549 m)   Wt 168 lb (76.2 kg)   SpO2 97%   BMI 31.74 kg/m  Gen:  Patient sitting on exam table,  appears stated age in no acute distress Head: Normocephalic atraumatic Eyes: EOMI, PERRL, sclera and conjunctiva non-erythematous Ears:  Canals clear bilaterally.  TMs pearly gray bilaterally without erythema or bulging.   Nose:  Nasal turbinates with mild clear exudates.  Mouth: Mucosa membranes moist. Tonsils +2 but erythematous.  No exudates. Neck: No cervical lymphadenopathy noted Heart:  RRR, no murmurs auscultated. Pulm:  Wheezes noted BL bases.  .     Assessment and Plan:  1.  Bronchitis:   - Chest radiography revealed mild bronchitic changes. -She did have some wheezing on exam. -She has no known history of asthma. -Seems to have been triggered from viral URI. -Plan treatment next 5 days 40 mg prednisone per day. -I have also provided her with an inhaler since she had wheezing on exam and has history of allergies. -Follow-up the next 4-6 weeks for pulmonary function testing for asthma. She does have worsening known seasonal allergies. No eczema.

## 2016-06-04 DIAGNOSIS — H66001 Acute suppurative otitis media without spontaneous rupture of ear drum, right ear: Secondary | ICD-10-CM | POA: Diagnosis not present

## 2016-06-19 ENCOUNTER — Encounter: Payer: Self-pay | Admitting: Internal Medicine

## 2016-08-08 ENCOUNTER — Ambulatory Visit (AMBULATORY_SURGERY_CENTER): Payer: Self-pay | Admitting: *Deleted

## 2016-08-08 ENCOUNTER — Ambulatory Visit: Payer: BLUE CROSS/BLUE SHIELD

## 2016-08-08 ENCOUNTER — Encounter: Payer: Self-pay | Admitting: Podiatry

## 2016-08-08 ENCOUNTER — Ambulatory Visit (INDEPENDENT_AMBULATORY_CARE_PROVIDER_SITE_OTHER): Payer: BLUE CROSS/BLUE SHIELD | Admitting: Podiatry

## 2016-08-08 VITALS — Resp 16 | Ht 62.0 in | Wt 160.0 lb

## 2016-08-08 VITALS — Ht 62.0 in | Wt 174.6 lb

## 2016-08-08 DIAGNOSIS — B351 Tinea unguium: Secondary | ICD-10-CM

## 2016-08-08 DIAGNOSIS — S90211A Contusion of right great toe with damage to nail, initial encounter: Secondary | ICD-10-CM

## 2016-08-08 DIAGNOSIS — Z86010 Personal history of colonic polyps: Secondary | ICD-10-CM

## 2016-08-08 LAB — HEPATIC FUNCTION PANEL
ALT: 12 U/L (ref 6–29)
AST: 11 U/L (ref 10–30)
Albumin: 4 g/dL (ref 3.6–5.1)
Alkaline Phosphatase: 63 U/L (ref 33–115)
BILIRUBIN DIRECT: 0.1 mg/dL (ref <=0.2)
BILIRUBIN INDIRECT: 0.2 mg/dL (ref 0.2–1.2)
Total Bilirubin: 0.3 mg/dL (ref 0.2–1.2)
Total Protein: 7 g/dL (ref 6.1–8.1)

## 2016-08-08 MED ORDER — NA SULFATE-K SULFATE-MG SULF 17.5-3.13-1.6 GM/177ML PO SOLN: ORAL | 0 refills | Status: DC

## 2016-08-08 MED ORDER — TERBINAFINE HCL 250 MG PO TABS: 250.0000 mg | ORAL_TABLET | Freq: Every day | ORAL | 0 refills | Status: DC

## 2016-08-08 NOTE — Progress Notes (Signed)
No allergies to eggs or soy. No problems with anesthesia.  Pt given Emmi instructions for colonoscopy  No oxygen use  No diet drug use  

## 2016-08-08 NOTE — Progress Notes (Signed)
   Subjective:    Patient ID: Nichole Davis, female    DOB: 1986-03-18, 31 y.o.   MRN: [id number]  HPI Chief Complaint  Patient presents with  . Nail Problem    Bilateral; all nails; nail discoloration & thickened nails; pt stated, "wants all nails checked for nail fungus; noticed discoloration last summer (2017)      Review of Systems  All other systems reviewed and are negative.      Objective:   Physical Exam        Assessment & Plan:

## 2016-08-09 ENCOUNTER — Encounter: Payer: Self-pay | Admitting: Internal Medicine

## 2016-08-11 NOTE — Progress Notes (Signed)
Subjective:    Patient ID: Nichole Davis, female   DOB: 31 y.o.   MRN: [id number]   HPI patient presents stating I have had a lot of thickness and deformity of my nailbeds and I did traumatized my right big toe and I have had a lot of deformity of that particular nail. States that she does have quite a bit of odor associated with this and dryness of skin    Review of Systems  All other systems reviewed and are negative.       Objective:  Physical Exam  Cardiovascular: Intact distal pulses.   Musculoskeletal: Normal range of motion.  Neurological: She is alert.  Skin: Skin is warm.  Nursing note and vitals reviewed.  neurovascular status intact muscle strength was adequate range of motion within normal limits with patient noted to have discoloration of nailbeds with thickness especially of the hallux right where there was trauma and dystrophic changes. 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 I do think if she wants to get this better we'll take aggressive approach and I have recommended a combination of oral Lamisil in conjunction with laser therapy and trimmings. I did trim the nails today to take pressure off them and she is scheduled for her first treatment and also is given a have liver function studies done and was given requisition form for this

## 2016-08-16 ENCOUNTER — Telehealth: Payer: Self-pay | Admitting: *Deleted

## 2016-08-16 NOTE — Telephone Encounter (Addendum)
Pt states she would like to know if she needs a follow up with Dr. Paulla Dolly, and her lab results.Dr. Paulla Dolly states pt may begin Terbinafine.Dr. Paulla Dolly states pt does not need to schedule with him, unless there is no improvement after 6 months. I informed pt and she stated understanding, and asked if she would be able to do laser. I told her yes and that I could transfer to schedule. Pt agreed and transferred to schedulers to schedule laser.

## 2016-08-16 NOTE — Telephone Encounter (Signed)
no

## 2016-08-20 ENCOUNTER — Telehealth: Payer: Self-pay | Admitting: Internal Medicine

## 2016-08-20 NOTE — Telephone Encounter (Signed)
Called - has vomited each time she took a dose of SuPrep  Rec: 1) Stop Su Prep 2) Bisacodyl 20 mg x 1 3) Then 1 hr late dring 32oz Gatorade + Miralax 4) Repeat 3 tomorrow when supposed to take second dose SuPrep

## 2016-08-21 ENCOUNTER — Ambulatory Visit (AMBULATORY_SURGERY_CENTER): Payer: BLUE CROSS/BLUE SHIELD | Admitting: Internal Medicine

## 2016-08-21 ENCOUNTER — Ambulatory Visit: Payer: BLUE CROSS/BLUE SHIELD

## 2016-08-21 ENCOUNTER — Encounter: Payer: Self-pay | Admitting: Internal Medicine

## 2016-08-21 VITALS — BP 110/64 | HR 66 | Temp 98.7°F | Resp 11 | Ht 62.0 in | Wt 174.0 lb

## 2016-08-21 DIAGNOSIS — D124 Benign neoplasm of descending colon: Secondary | ICD-10-CM | POA: Diagnosis not present

## 2016-08-21 DIAGNOSIS — Z86010 Personal history of colonic polyps: Secondary | ICD-10-CM | POA: Diagnosis present

## 2016-08-21 DIAGNOSIS — D122 Benign neoplasm of ascending colon: Secondary | ICD-10-CM

## 2016-08-21 MED ORDER — SODIUM CHLORIDE 0.9 % IV SOLN: 500.0000 mL | INTRAVENOUS | Status: DC

## 2016-08-21 NOTE — Progress Notes (Signed)
Called to room to assist during endoscopic procedure.  Patient ID and intended procedure confirmed with present staff. Received instructions for my participation in the procedure from the performing physician.  

## 2016-08-21 NOTE — Progress Notes (Signed)
Pt's states no medical or surgical changes since previsit or office visit. 

## 2016-08-21 NOTE — Patient Instructions (Signed)
Handout given on polyps  YOU HAD AN ENDOSCOPIC PROCEDURE TODAY: Refer to the procedure report and other information in the discharge instructions given to you for any specific questions about what was found during the examination. If this information does not answer your questions, please call [place] office at [phone] to clarify.   YOU SHOULD EXPECT: Some feelings of bloating in the abdomen. Passage of more gas than usual. Walking can help get rid of the air that was put into your GI tract during the procedure and reduce the bloating. If you had a lower endoscopy (such as a colonoscopy or flexible sigmoidoscopy) you may notice spotting of blood in your stool or on the toilet paper. Some abdominal soreness may be present for a day or two, also.  DIET: Your first meal following the procedure should be a light meal and then it is ok to progress to your normal diet. A half-sandwich or bowl of soup is an example of a good first meal. Heavy or fried foods are harder to digest and may make you feel nauseous or bloated. Drink plenty of fluids but you should avoid alcoholic beverages for 24 hours. If you had a esophageal dilation, please see attached instructions for diet.    ACTIVITY: Your care partner should take you home directly after the procedure. You should plan to take it easy, moving slowly for the rest of the day. You can resume normal activity the day after the procedure however YOU SHOULD NOT DRIVE, use power tools, machinery or perform tasks that involve climbing or major physical exertion for 24 hours (because of the sedation medicines used during the test).   SYMPTOMS TO REPORT IMMEDIATELY: A gastroenterologist can be reached at any hour. Please call [phone]  for any of the following symptoms:  Following lower endoscopy (colonoscopy, flexible sigmoidoscopy) Excessive amounts of blood in the stool  Significant tenderness, worsening of abdominal pains  Swelling of the abdomen that is  new, acute  Fever of 100 or higher    FOLLOW UP:  If any biopsies were taken you will be contacted by phone or by letter within the next 1-3 weeks. Call [phone]  if you have not heard about the biopsies in 3 weeks.  Please also call with any specific questions about appointments or follow up tests.  

## 2016-08-21 NOTE — Progress Notes (Signed)
Report to PACU, RN, vss, BBS= Clear.  

## 2016-08-21 NOTE — Op Note (Signed)
Nichole Davis: Nichole Davis Procedure Date: 08/21/2016 1:22 PM MRN: [id number] Endoscopist: Docia Chuck. Henrene Pastor , MD Age: 31 Referring MD:  Date of Birth: 10-20-1985 Gender: Female Account #: [id number] Procedure:                Colonoscopy, with cold snare polypectomy X2 Indications:              High risk colon cancer surveillance: Personal                            history of adenoma (10 mm or greater in size)                            September 2015, High risk colon cancer                            surveillance: Personal history of adenoma with                            villous component. 5 cm sigmoid colon polyp removed                            with laparoscopic sigmoid colectomy November 2015 Medicines:                Monitored Anesthesia Care Procedure:                Pre-Anesthesia Assessment:                           - Prior to the procedure, a History and Physical                            was performed, and patient medications and                            allergies were reviewed. The patient's tolerance of                            previous anesthesia was also reviewed. The risks                            and benefits of the procedure and the sedation                            options and risks were discussed with the patient.                            All questions were answered, and informed consent                            was obtained. Prior Anticoagulants: The patient has                            taken no previous anticoagulant or antiplatelet  agents. ASA Grade Assessment: I - A normal, healthy                            patient. After reviewing the risks and benefits,                            the patient was deemed in satisfactory condition to                            undergo the procedure.                           After obtaining informed consent, the colonoscope                            was passed  under direct vision. Throughout the                            procedure, the patient's blood pressure, pulse, and                            oxygen saturations were monitored continuously. The                            Colonoscope was introduced through the anus and                            advanced to the the cecum, identified by                            appendiceal orifice and ileocecal valve. The                            ileocecal valve, appendiceal orifice, and rectum                            were photographed. The quality of the bowel                            preparation was good. The colonoscopy was performed                            without difficulty. The patient tolerated the                            procedure well. The bowel preparation used was                            SUPREP. Scope In: 1:27:40 PM Scope Out: 1:40:59 PM Scope Withdrawal Time: 0 hours 11 minutes 41 seconds  Total Procedure Duration: 0 hours 13 minutes 19 seconds  Findings:                 Two polyps were found in the descending colon and  ascending colon. The polyps were 3 to 5 mm in size.                            These polyps were removed with a cold snare.                            Resection and retrieval were complete.                           Internal hemorrhoids were found during retroflexion.                           Status post sigmoid colectomy with healthy                            anastomosis at 20 cm. The exam was otherwise                            without abnormality on direct and retroflexion                            views. Complications:            No immediate complications. Estimated blood loss:                            None. Estimated Blood Loss:     Estimated blood loss: none. Impression:               - Two 3 to 5 mm polyps in the descending colon and                            in the ascending colon, removed with a cold snare.                             Resected and retrieved.                           - Internal hemorrhoids.                           - The examination was otherwise normal on direct                            and retroflexion views, status post sigmoid                            colectomy. Recommendation:           - Repeat colonoscopy in 3 years for surveillance.                           - Patient has a contact number available for                            emergencies. The signs and symptoms of potential  delayed complications were discussed with the                            patient. Return to normal activities tomorrow.                            Written discharge instructions were provided to the                            patient.                           - Resume previous diet.                           - Continue present medications.                           - Await pathology results.                           - Recommend that your siblings greater than 52                            years old be screened with colonoscopy Renji Berwick N. Henrene Pastor, MD 08/21/2016 1:48:55 PM This report has been signed electronically.

## 2016-08-22 ENCOUNTER — Telehealth: Payer: Self-pay | Admitting: *Deleted

## 2016-08-22 ENCOUNTER — Telehealth: Payer: Self-pay | Admitting: Internal Medicine

## 2016-08-22 NOTE — Telephone Encounter (Signed)
  Follow up Call-  Call back number 08/21/2016 12/02/2013  Post procedure Call Back phone  # [phone] cell  Permission to leave phone message Yes Yes  Some recent data might be hidden     Patient questions:  Do you have a fever, pain , or abdominal swelling? No. Pain Score  0 *  Have you tolerated food without any problems? Yes.    Have you been able to return to your normal activities? Yes.    Do you have any questions about your discharge instructions: Diet   No. Medications  No. Follow up visit  No.  Do you have questions or concerns about your Care? No.  Actions: * If pain score is 4 or above: No action needed, pain <4.

## 2016-08-22 NOTE — Telephone Encounter (Signed)
Pt states she is having some cramping in her lower stomach today and wanted to know if this is normal. Discussed with pt that the discomfort she is feeling is most likely due to gas. Discussed with pt that she can walk around to try and get things moving and try to expel some gas. Pt knows to call back if pain persists or gets worse.

## 2016-08-22 NOTE — Telephone Encounter (Signed)
Voice mailbox has not been set up at number given to do call back.

## 2016-08-22 NOTE — Telephone Encounter (Signed)
Letter faxed per pt request.

## 2016-08-27 ENCOUNTER — Encounter: Payer: Self-pay | Admitting: Internal Medicine

## 2016-08-28 ENCOUNTER — Encounter (HOSPITAL_COMMUNITY): Payer: Self-pay | Admitting: Emergency Medicine

## 2016-08-28 ENCOUNTER — Emergency Department (HOSPITAL_COMMUNITY)
Admission: EM | Admit: 2016-08-28 | Discharge: 2016-08-28 | Disposition: A | Discharge status: Home / Self Care | Payer: BLUE CROSS/BLUE SHIELD | Attending: Emergency Medicine | Admitting: Emergency Medicine

## 2016-08-28 ENCOUNTER — Emergency Department (HOSPITAL_COMMUNITY): Payer: BLUE CROSS/BLUE SHIELD

## 2016-08-28 DIAGNOSIS — S3992XA Unspecified injury of lower back, initial encounter: Secondary | ICD-10-CM | POA: Diagnosis not present

## 2016-08-28 DIAGNOSIS — Y92410 Unspecified street and highway as the place of occurrence of the external cause: Secondary | ICD-10-CM | POA: Insufficient documentation

## 2016-08-28 DIAGNOSIS — M549 Dorsalgia, unspecified: Secondary | ICD-10-CM | POA: Diagnosis not present

## 2016-08-28 DIAGNOSIS — Y939 Activity, unspecified: Secondary | ICD-10-CM | POA: Insufficient documentation

## 2016-08-28 DIAGNOSIS — S299XXA Unspecified injury of thorax, initial encounter: Secondary | ICD-10-CM | POA: Diagnosis present

## 2016-08-28 DIAGNOSIS — Z79899 Other long term (current) drug therapy: Secondary | ICD-10-CM | POA: Diagnosis not present

## 2016-08-28 DIAGNOSIS — M546 Pain in thoracic spine: Secondary | ICD-10-CM | POA: Insufficient documentation

## 2016-08-28 DIAGNOSIS — Y999 Unspecified external cause status: Secondary | ICD-10-CM | POA: Insufficient documentation

## 2016-08-28 MED ORDER — METHOCARBAMOL 500 MG PO TABS: 500.0000 mg | ORAL_TABLET | Freq: Two times a day (BID) | ORAL | 0 refills | Status: DC

## 2016-08-28 MED ORDER — IBUPROFEN 600 MG PO TABS: 600.0000 mg | ORAL_TABLET | Freq: Four times a day (QID) | ORAL | 0 refills | Status: DC | PRN

## 2016-08-28 NOTE — ED Notes (Signed)
Bed: WTR6 Expected date:  Expected time:  Means of arrival:  Comments: 
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
## 2016-08-28 NOTE — Discharge Instructions (Signed)
Take your medications as prescribed. I also recommend applying ice and/or heat to affected area for 15-20 minutes 3-4 times daily for additional pain relief. Refrain from doing any heavy lifting, squatting or repetitive movements that exacerbate your symptoms. Follow-up with your primary care provider in the next week if her symptoms have not improved.  Please return to the Emergency Department if symptoms worsen or new onset of fever, numbness, tingling, groin anesthesia, loss of bowel or bladder, weakness.
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
## 2016-09-28 DIAGNOSIS — N760 Acute vaginitis: Secondary | ICD-10-CM | POA: Diagnosis not present

## 2016-10-03 DIAGNOSIS — J018 Other acute sinusitis: Secondary | ICD-10-CM | POA: Diagnosis not present

## 2016-12-06 ENCOUNTER — Encounter (HOSPITAL_BASED_OUTPATIENT_CLINIC_OR_DEPARTMENT_OTHER): Payer: Self-pay | Admitting: Emergency Medicine

## 2016-12-06 ENCOUNTER — Emergency Department (HOSPITAL_BASED_OUTPATIENT_CLINIC_OR_DEPARTMENT_OTHER)
Admission: EM | Admit: 2016-12-06 | Discharge: 2016-12-06 | Disposition: A | Discharge status: Home / Self Care | Payer: BLUE CROSS/BLUE SHIELD | Attending: Emergency Medicine | Admitting: Emergency Medicine

## 2016-12-06 DIAGNOSIS — Z5321 Procedure and treatment not carried out due to patient leaving prior to being seen by health care provider: Secondary | ICD-10-CM | POA: Insufficient documentation

## 2016-12-06 DIAGNOSIS — F419 Anxiety disorder, unspecified: Secondary | ICD-10-CM | POA: Diagnosis not present

## 2016-12-06 DIAGNOSIS — R0602 Shortness of breath: Secondary | ICD-10-CM | POA: Diagnosis not present

## 2016-12-06 NOTE — ED Notes (Signed)
Called for pt in the waiting room, [facility] . No answer X's 1

## 2016-12-06 NOTE — ED Triage Notes (Signed)
Patient comes in via [facility] for having a "panic" attack at work. THe patient is reported to have had a near syncopal episode with tingling to her lips and fingers. The patient is now calm and cooperative  and without the "panic attack" symptoms.

## 2016-12-11 ENCOUNTER — Telehealth: Payer: Self-pay

## 2016-12-11 NOTE — Telephone Encounter (Signed)
Called to remind patient of need for a Pap smear. She did not answer and VM was not set up.  No Pap on file since 2015.

## 2016-12-28 DIAGNOSIS — Z23 Encounter for immunization: Secondary | ICD-10-CM | POA: Diagnosis not present

## 2017-01-13 DIAGNOSIS — H35413 Lattice degeneration of retina, bilateral: Secondary | ICD-10-CM | POA: Diagnosis not present

## 2017-01-28 ENCOUNTER — Ambulatory Visit: Payer: BLUE CROSS/BLUE SHIELD | Admitting: Physician Assistant

## 2017-02-04 ENCOUNTER — Encounter: Payer: Self-pay | Admitting: Physician Assistant

## 2017-02-04 ENCOUNTER — Other Ambulatory Visit: Payer: Self-pay

## 2017-02-04 ENCOUNTER — Ambulatory Visit: Payer: BLUE CROSS/BLUE SHIELD | Admitting: Physician Assistant

## 2017-02-04 VITALS — BP 118/78 | HR 93 | Temp 98.2°F | Resp 16 | Ht 62.0 in | Wt 181.0 lb

## 2017-02-04 DIAGNOSIS — J988 Other specified respiratory disorders: Secondary | ICD-10-CM | POA: Diagnosis not present

## 2017-02-04 DIAGNOSIS — B9789 Other viral agents as the cause of diseases classified elsewhere: Secondary | ICD-10-CM

## 2017-02-04 LAB — POCT INFLUENZA A/B
Influenza A, POC: NEGATIVE
Influenza B, POC: NEGATIVE

## 2017-02-04 MED ORDER — BENZONATATE 100 MG PO CAPS: 100.0000 mg | ORAL_CAPSULE | Freq: Three times a day (TID) | ORAL | 0 refills | Status: DC | PRN

## 2017-02-04 MED ORDER — GUAIFENESIN ER 1200 MG PO TB12: 1.0000 | ORAL_TABLET | Freq: Two times a day (BID) | ORAL | 1 refills | Status: DC | PRN

## 2017-02-04 NOTE — Patient Instructions (Addendum)
Make sure you hydrate well with 64 oz of water per day.    Viral Respiratory Infection A viral respiratory infection is an illness that affects parts of the body used for breathing, like the lungs, nose, and throat. It is caused by a germ called a virus. Some examples of this kind of infection are:  A cold.  The flu (influenza).  A respiratory syncytial virus (RSV) infection.  How do I know if I have this infection? Most of the time this infection causes:  A stuffy or runny nose.  Yellow or green fluid in the nose.  A cough.  Sneezing.  Tiredness (fatigue).  Achy muscles.  A sore throat.  Sweating or chills.  A fever.  A headache.  How is this infection treated? If the flu is diagnosed early, it may be treated with an antiviral medicine. This medicine shortens the length of time a person has symptoms. Symptoms may be treated with over-the-counter and prescription medicines, such as:  Expectorants. These make it easier to cough up mucus.  Decongestant nasal sprays.  Doctors do not prescribe antibiotic medicines for viral infections. They do not work with this kind of infection. How do I know if I should stay home? To keep others from getting sick, stay home if you have:  A fever.  A lasting cough.  A sore throat.  A runny nose.  Sneezing.  Muscles aches.  Headaches.  Tiredness.  Weakness.  Chills.  Sweating.  An upset stomach (nausea).  Follow these instructions at home:  Rest as much as possible.  Take over-the-counter and prescription medicines only as told by your doctor.  Drink enough fluid to keep your pee (urine) clear or pale yellow.  Gargle with salt water. Do this 3-4 times per day or as needed. To make a salt-water mixture, dissolve -1 tsp of salt in 1 cup of warm water. Make sure the salt dissolves all the way.  Use nose drops made from salt water. This helps with stuffiness (congestion). It also helps soften the skin around  your nose.  Do not drink alcohol.  Do not use tobacco products, including cigarettes, chewing tobacco, and e-cigarettes. If you need help quitting, ask your doctor. Get help if:  Your symptoms last for 10 days or longer.  Your symptoms get worse over time.  You have a fever.  You have very bad pain in your face or forehead.  Parts of your jaw or neck become very swollen. Get help right away if:  You feel pain or pressure in your chest.  You have shortness of breath.  You faint or feel like you will faint.  You keep throwing up (vomiting).  You feel confused. This information is not intended to replace advice given to you by your health care provider. Make sure you discuss any questions you have with your health care provider. Document Released: 02/16/2008 Document Revised: 08/11/2015 Document Reviewed: 08/11/2014 Elsevier Interactive Patient Education  2018 [employer].    IF you received an x-ray today, you will receive an invoice from [facility]. Please contact [facility] at [phone] with questions or concerns regarding your invoice.   IF you received labwork today, you will receive an invoice from [facility]. Please contact [facility] at [phone] with questions or concerns regarding your invoice.   Our billing staff will not be able to assist you with questions regarding bills from these companies.  You will be contacted with the lab results as soon as they are available.  The fastest way to get your results is to activate your My Chart account. Instructions are located on the last page of this paperwork. If you have not heard from [place] regarding the results in 2 weeks, please contact this office.

## 2017-02-04 NOTE — Progress Notes (Signed)
PRIMARY CARE AT [facility] [address] [place] [address] [phone]  Date:  02/04/2017   Name:  Nichole Davis   DOB:  May 20, 1985   MRN:  [id number]  PCP:  Wardell Honour, MD    History of Present Illness:  Nichole Davis is a 31 y.o. female patient who presents to PCP with  Chief Complaint  Patient presents with  . Sore Throat    x 4 days  . Sinusitis    x 4 days  . Cough    x 4 days  . Generalized Body Aches  . Diarrhea    pt states she has the feeling to use the bathroom, but not alot comes out     4 days of fever chills.  She has cough and body aches.  The next day of illness, throat pain started.   She reports that people in contact stated that she looked pale.  Then flushed.  Headaches.  Today, she had multiple bowel movements without diarrhea, she states.   She has taken otc cold and cough pills.  She was taking aleve for headaches which helped.    Patient Active Problem List   Diagnosis Date Noted  . Normal labor 06/07/2015  . Pregnancy 06/07/2015  . Hematochezia 11/04/2013  . Change in bowel habits 11/04/2013  . Adenomatous polyp of sigmoid colon s/p colectomy 01/28/2014 08/21/2013  . Exposure to STD 07/01/2012    Past Medical History:  Diagnosis Date  . Anemia   . Anxiety   . Bone marrow donor    for brother  . Chest pain    pt. seen at [facility] 07/12/2011, for chest pain, cleared medically, given  Lorazepam & prilosec.  Took Lorazepam & had good relief fr. it, has not taken Prilosec  . GERD (gastroesophageal reflux disease)   . Hx of varicella   . Macrosomia affecting management of mother in third trimester   . PONV (postoperative nausea and vomiting)   . 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 STD (sexually transmitted disease)    trichomonas 8/09, chlamydia 2014  . Vaginal discharge 07/01/2012    Past Surgical History:  Procedure Laterality  Date  . BONE MARROW HARVEST  2005  . CESAREAN SECTION N/A 06/07/2015   Performed by Linda Hedges, DO at [facility]  . DILATATION AND EVACUATION N/A 11/30/2013   Performed by Lyman Speller, MD at [facility]  . implanon removal Left    removed 2011, arm  . LAPAROSCOPIC SIGMOID COLECTOMY, N/A 01/28/2014   Performed by Michael Boston, MD at [facility]  . LYMPH NODE BIOPSY Left 2010   left side neck  . PHOTOCOAGULATION WITH LASER Right 07/17/2011   Performed by Hayden Pedro, MD at [facility] 01/28/2014   Performed by Michael Boston, MD at [facility]  . RETINAL DETACHMENT REPAIR W/ SCLERAL BUCKLE LE Right 07/17/11   eye  . RETINAL TEAR REPAIR CRYOTHERAPY Left   . SCLERAL BUCKLE Right 07/17/2011   Performed by Hayden Pedro, MD at [facility] History   Tobacco Use  . Smoking status: Never Smoker  . Smokeless tobacco: Never Used  Substance Use Topics  . Alcohol use: No  . Drug use: No    Family History  Problem Relation Age of Onset  . Leukemia Brother   .  Anesthesia problems Neg Hx   . Colon cancer Neg Hx   . Esophageal cancer Neg Hx   . Pancreatic cancer Neg Hx   . Rectal cancer Neg Hx   . Stomach cancer Neg Hx     No Known Allergies  Medication list has been reviewed and updated.  Current Outpatient Medications on File Prior to Visit  Medication Sig Dispense Refill  . ibuprofen (ADVIL,MOTRIN) 600 MG tablet Take 1 tablet (600 mg total) by mouth every 6 (six) hours as needed. (Patient not taking: Reported on 02/04/2017) 30 tablet 0  . methocarbamol (ROBAXIN) 500 MG tablet Take 1 tablet (500 mg total) by mouth 2 (two) times daily. (Patient not taking: Reported on 02/04/2017) 20 tablet 0  . terbinafine (LAMISIL) 250 MG tablet Take 1 tablet (250 mg total) by mouth daily. (Patient not taking: Reported on 08/21/2016) 90 tablet 0   Current Facility-Administered Medications on File Prior to Visit  Medication Dose Route Frequency Provider Last Rate Last Dose  . 0.9 %   sodium chloride infusion  500 mL Intravenous Continuous Irene Shipper, MD        ROS ROS otherwise unremarkable unless listed above.  Physical Examination: BP 118/78   Pulse 93   Temp 98.2 F (36.8 C) (Oral)   Resp 16   Ht 5\' 2"  (1.575 m)   Wt 181 lb (82.1 kg)   SpO2 99%   BMI 33.11 kg/m  Ideal Body Weight: Weight in (lb) to have BMI = 25: 136.4  Physical Exam  Constitutional: She is oriented to person, place, and time. She appears well-developed and well-nourished. No distress.  HENT:  Head: Normocephalic and atraumatic.  Right Ear: Tympanic membrane, external ear and ear canal normal.  Left Ear: Tympanic membrane, external ear and ear canal normal.  Nose: Mucosal edema and rhinorrhea present. Right sinus exhibits no maxillary sinus tenderness and no frontal sinus tenderness. Left sinus exhibits no maxillary sinus tenderness and no frontal sinus tenderness.  Mouth/Throat: No uvula swelling. No oropharyngeal exudate, posterior oropharyngeal edema or posterior oropharyngeal erythema.  Eyes: Conjunctivae and EOM are normal. Pupils are equal, round, and reactive to light.  Cardiovascular: Normal rate and regular rhythm. Exam reveals no gallop, no distant heart sounds and no friction rub.  No murmur heard. Pulmonary/Chest: Effort normal. No respiratory distress. She has no decreased breath sounds. She has no wheezes. She has no rhonchi.  Lymphadenopathy:       Head (right side): No submandibular, no tonsillar, no preauricular and no posterior auricular adenopathy present.       Head (left side): No submandibular, no tonsillar, no preauricular and no posterior auricular adenopathy present.  Neurological: She is alert and oriented to person, place, and time.  Skin: She is not diaphoretic.  Psychiatric: She has a normal mood and affect. Her behavior is normal.     Assessment and Plan: Nichole Davis is a 31 y.o. female who is here today for cc of  Chief Complaint  Patient presents  with  . Sore Throat    x 4 days  . Sinusitis    x 4 days  . Cough    x 4 days  . Generalized Body Aches  . Diarrhea    pt states she has the feeling to use the bathroom, but not alot comes out  likely viral Treat symptomatically. Viral respiratory illness - Plan: POCT Influenza A/B, Guaifenesin (MUCINEX MAXIMUM STRENGTH) 1200 MG TB12, benzonatate (TESSALON) 100 MG capsule  Ivar Drape,  PA-C [facility] 11/25/20186:14 PM

## 2017-06-19 DIAGNOSIS — J309 Allergic rhinitis, unspecified: Secondary | ICD-10-CM | POA: Diagnosis not present

## 2017-06-26 ENCOUNTER — Encounter: Payer: Self-pay | Admitting: Physician Assistant

## 2017-08-15 DIAGNOSIS — J069 Acute upper respiratory infection, unspecified: Secondary | ICD-10-CM | POA: Diagnosis not present

## 2017-08-15 DIAGNOSIS — H5213 Myopia, bilateral: Secondary | ICD-10-CM | POA: Diagnosis not present

## 2017-08-15 DIAGNOSIS — S0501XA Injury of conjunctiva and corneal abrasion without foreign body, right eye, initial encounter: Secondary | ICD-10-CM | POA: Diagnosis not present

## 2017-08-15 DIAGNOSIS — M940 Chondrocostal junction syndrome [Tietze]: Secondary | ICD-10-CM | POA: Diagnosis not present

## 2017-12-25 DIAGNOSIS — Z23 Encounter for immunization: Secondary | ICD-10-CM | POA: Diagnosis not present

## 2018-01-14 DIAGNOSIS — Z6835 Body mass index (BMI) 35.0-35.9, adult: Secondary | ICD-10-CM | POA: Diagnosis not present

## 2018-01-14 DIAGNOSIS — Z01419 Encounter for gynecological examination (general) (routine) without abnormal findings: Secondary | ICD-10-CM | POA: Diagnosis not present

## 2018-03-28 DIAGNOSIS — Z0000 Encounter for general adult medical examination without abnormal findings: Secondary | ICD-10-CM | POA: Diagnosis not present
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
## 2019-01-21 DIAGNOSIS — Z01419 Encounter for gynecological examination (general) (routine) without abnormal findings: Secondary | ICD-10-CM | POA: Diagnosis not present

## 2019-01-21 DIAGNOSIS — Z6833 Body mass index (BMI) 33.0-33.9, adult: Secondary | ICD-10-CM | POA: Diagnosis not present

## 2019-03-23 ENCOUNTER — Other Ambulatory Visit: Payer: BLUE CROSS/BLUE SHIELD

## 2019-03-24 DIAGNOSIS — Z20828 Contact with and (suspected) exposure to other viral communicable diseases: Secondary | ICD-10-CM | POA: Diagnosis not present

## 2019-09-11 DIAGNOSIS — H5319 Other subjective visual disturbances: Secondary | ICD-10-CM | POA: Diagnosis not present

## 2020-04-14 DIAGNOSIS — K649 Unspecified hemorrhoids: Secondary | ICD-10-CM | POA: Diagnosis not present

## 2020-04-14 DIAGNOSIS — L819 Disorder of pigmentation, unspecified: Secondary | ICD-10-CM | POA: Diagnosis not present

## 2020-08-29 DIAGNOSIS — Z6828 Body mass index (BMI) 28.0-28.9, adult: Secondary | ICD-10-CM | POA: Diagnosis not present

## 2020-08-29 DIAGNOSIS — Z113 Encounter for screening for infections with a predominantly sexual mode of transmission: Secondary | ICD-10-CM | POA: Diagnosis not present

## 2020-08-29 DIAGNOSIS — Z01419 Encounter for gynecological examination (general) (routine) without abnormal findings: Secondary | ICD-10-CM | POA: Diagnosis not present

## 2021-05-23 DIAGNOSIS — N760 Acute vaginitis: Secondary | ICD-10-CM | POA: Diagnosis not present

## 2021-05-23 DIAGNOSIS — Z113 Encounter for screening for infections with a predominantly sexual mode of transmission: Secondary | ICD-10-CM | POA: Diagnosis not present

## 2021-06-22 DIAGNOSIS — F411 Generalized anxiety disorder: Secondary | ICD-10-CM | POA: Diagnosis not present

## 2022-01-10 DIAGNOSIS — R0602 Shortness of breath: Secondary | ICD-10-CM | POA: Diagnosis not present

## 2022-01-10 DIAGNOSIS — R202 Paresthesia of skin: Secondary | ICD-10-CM | POA: Diagnosis not present

## 2022-01-10 DIAGNOSIS — R0789 Other chest pain: Secondary | ICD-10-CM | POA: Diagnosis not present

## 2022-01-10 DIAGNOSIS — R079 Chest pain, unspecified: Secondary | ICD-10-CM | POA: Diagnosis not present

## 2022-01-10 DIAGNOSIS — F419 Anxiety disorder, unspecified: Secondary | ICD-10-CM | POA: Diagnosis not present

## 2022-03-29 ENCOUNTER — Encounter: Payer: Self-pay | Admitting: Internal Medicine

## 2022-04-10 ENCOUNTER — Telehealth: Payer: Self-pay | Admitting: Internal Medicine

## 2022-04-10 NOTE — Telephone Encounter (Signed)
Patient is calling states she is starting to have symptoms such as constipation and blood in her stool and is wondering where to go from here. Please advise

## 2022-04-11 NOTE — Telephone Encounter (Signed)
Pt already has colon scheduled. She states she is constipated, having blood in her stool and abd pain. Discussed with pt that she can try miralax 1-3 doses daily to have BM and that the constipation has probably caused her rectal bleeding. Pt verbalized understanding. Pt verbalized understanding.

## 2022-04-27 ENCOUNTER — Ambulatory Visit (AMBULATORY_SURGERY_CENTER): Payer: Medicaid Other

## 2022-04-27 VITALS — Ht 62.0 in | Wt 165.0 lb

## 2022-04-27 DIAGNOSIS — Z86010 Personal history of colonic polyps: Secondary | ICD-10-CM

## 2022-04-27 MED ORDER — NA SULFATE-K SULFATE-MG SULF 17.5-3.13-1.6 GM/177ML PO SOLN: 1.0000 | Freq: Once | ORAL | 0 refills | Status: AC

## 2022-04-27 NOTE — Progress Notes (Signed)
No egg or soy allergy known to patient  No issues known to pt with past sedation with any surgeries or procedures Patient denies ever being told they had issues or difficulty with intubation  No FH of Malignant Hyperthermia Pt is not on diet pills Pt is not on  home 02  Pt is not on blood thinners  Pt denies issues with constipation  No A fib or A flutter Have any cardiac testing pending--no Pt instructed to use Singlecare.com or GoodRx for a price reduction on prep   

## 2022-05-22 ENCOUNTER — Encounter: Payer: Self-pay | Admitting: Internal Medicine

## 2022-05-22 ENCOUNTER — Ambulatory Visit (AMBULATORY_SURGERY_CENTER): Payer: 59 | Admitting: Internal Medicine

## 2022-05-22 VITALS — BP 113/76 | HR 59 | Temp 98.6°F | Resp 12 | Ht 62.0 in | Wt 165.0 lb

## 2022-05-22 DIAGNOSIS — D125 Benign neoplasm of sigmoid colon: Secondary | ICD-10-CM

## 2022-05-22 DIAGNOSIS — Z09 Encounter for follow-up examination after completed treatment for conditions other than malignant neoplasm: Secondary | ICD-10-CM

## 2022-05-22 DIAGNOSIS — Z86010 Personal history of colonic polyps: Secondary | ICD-10-CM | POA: Diagnosis not present

## 2022-05-22 MED ORDER — NA SULFATE-K SULFATE-MG SULF 17.5-3.13-1.6 GM/177ML PO SOLN: 1.0000 | Freq: Once | ORAL | 0 refills | Status: AC

## 2022-05-22 MED ORDER — SODIUM CHLORIDE 0.9 % IV SOLN: 500.0000 mL | INTRAVENOUS | Status: DC

## 2022-05-22 NOTE — Patient Instructions (Addendum)
Repeat colonoscopy within 1 year for  surveillance. Needs 2-day prep. Resume previous diet.                          Continue present medications.                            Please read over handout about hemorrhoids                                              YOU HAD AN ENDOSCOPIC PROCEDURE TODAY AT [place]:   Refer to the procedure report that was given to you for any specific questions about what was found during the examination.  If the procedure report does not answer your questions, please call your gastroenterologist to clarify.  If you requested that your care partner not be given the details of your procedure findings, then the procedure report has been included in a sealed envelope for you to review at your convenience later.  YOU SHOULD EXPECT: Some feelings of bloating in the abdomen. Passage of more gas than usual.  Walking can help get rid of the air that was put into your GI tract during the procedure and reduce the bloating. If you had a lower endoscopy (such as a colonoscopy or flexible sigmoidoscopy) you may notice spotting of blood in your stool or on the toilet paper. If you underwent a bowel prep for your procedure, you may not have a normal bowel movement for a few days.  Please Note:  You might notice some irritation and congestion in your nose or some drainage.  This is from the oxygen used during your procedure.  There is no need for concern and it should clear up in a day or so.  SYMPTOMS TO REPORT IMMEDIATELY:  Following lower endoscopy (colonoscopy or flexible sigmoidoscopy):  Excessive amounts of blood in the stool  Significant tenderness or worsening of abdominal pains  Swelling of the abdomen that is new, acute  Fever of 100F or higher  For urgent or emergent issues, a gastroenterologist can be reached at any hour by calling [phone]. Do not use MyChart messaging for urgent concerns.    DIET:  We do recommend a small meal at first,  but then you may proceed to your regular diet.  Drink plenty of fluids but you should avoid alcoholic beverages for 24 hours.  ACTIVITY:  You should plan to take it easy for the rest of today and you should NOT DRIVE or use heavy machinery until tomorrow (because of the sedation medicines used during the test).    FOLLOW UP: Our staff will call the number listed on your records the next business day following your procedure.  We will call around 7:15- 8:00 am to check on you and address any questions or concerns that you may have regarding the information given to you following your procedure. If we do not reach you, we will leave a message.     SIGNATURES/CONFIDENTIALITY: You and/or your care partner have signed paperwork which will be entered into your electronic medical record.  These signatures attest to the fact that that the information above on your After Visit Summary has been reviewed and is understood.  Full responsibility of the confidentiality of this discharge information lies  with you and/or your care-partner.

## 2022-05-22 NOTE — Op Note (Signed)
[facility] Patient Name: Nichole Davis Procedure Date: 05/22/2022 9:20 AM MRN: [id number] Endoscopist: Docia Chuck. Henrene Pastor , MD, [id number] Age: 37 Referring MD:  Date of Birth: [birth year] Gender: Female Account #: [id number] Procedure:                Colonoscopy with cold snare polypectomy x 1 Indications:              High risk colon cancer surveillance: Personal                            history of adenoma (10 mm or greater in size), High                            risk colon cancer surveillance: Personal history of                            adenoma with villous component Medicines:                Monitored Anesthesia Care Procedure:                Pre-Anesthesia Assessment:                           - Prior to the procedure, a History and Physical                            was performed, and patient medications and                            allergies were reviewed. The patient's tolerance of                            previous anesthesia was also reviewed. The risks                            and benefits of the procedure and the sedation                            options and risks were discussed with the patient.                            All questions were answered, and informed consent                            was obtained. Prior Anticoagulants: The patient has                            taken no anticoagulant or antiplatelet agents. ASA                            Grade Assessment: II - A patient with mild systemic                            disease. After reviewing the risks and benefits,  the patient was deemed in satisfactory condition to                            undergo the procedure.                           After obtaining informed consent, the colonoscope                            was passed under direct vision. Throughout the                            procedure, the patient's blood pressure, pulse, and                             oxygen saturations were monitored continuously. The                            CF [id number] was introduced through the anus                            and advanced to the the cecum, identified by                            appendiceal orifice and ileocecal valve. The                            ileocecal valve, appendiceal orifice, and rectum                            were photographed. The quality of the bowel                            preparation was poor. The colonoscopy was performed                            without difficulty. The patient tolerated the                            procedure well. The bowel preparation used was                            SUPREP via split dose instruction. Scope In: 9:29:22 AM Scope Out: 9:37:23 AM Scope Withdrawal Time: 0 hours 5 minutes 16 seconds  Total Procedure Duration: 0 hours 8 minutes 1 second  Findings:                 A 2 mm polyp was found in the sigmoid colon. The                            polyp was removed with a cold snare. Resection was                            complete. Not retrieved due to dense  debris in the                            colon.                           Hemorrhoids were found during retroflexion. The                            hemorrhoids were moderate. The anastomosis was                            noted at 20 cm from the anal verge.                           The exam was otherwise without abnormality on                            direct and retroflexion views, though compromised                            by poor prep. Complications:            No immediate complications. Estimated blood loss:                            None. Estimated Blood Loss:     Estimated blood loss: none. Impression:               - Preparation of the colon was poor.                           - One 2 mm polyp in the sigmoid colon, removed with                            a cold snare. Resected but not retrieved.                            - Hemorrhoids. Anastomosis at 20 cm.                           - The examination was otherwise normal on direct                            and retroflexion views, but compromised by poor                            prep. Recommendation:           - Repeat colonoscopy within 1 year for                            surveillance. Needs 2-day prep.                           - Patient has a contact number available for  emergencies. The signs and symptoms of potential                            delayed complications were discussed with the                            patient. Return to normal activities tomorrow.                            Written discharge instructions were provided to the                            patient.                           - Resume previous diet.                           - Continue present medications. Docia Chuck. Henrene Pastor, MD 05/22/2022 9:47:02 AM This report has been signed electronically.

## 2022-05-22 NOTE — Progress Notes (Signed)
Called to room to assist during endoscopic procedure.  Patient ID and intended procedure confirmed with present staff. Received instructions for my participation in the procedure from the performing physician.  

## 2022-05-22 NOTE — Progress Notes (Signed)
Pt's states no medical or surgical changes since previsit or office visit. 

## 2022-05-22 NOTE — Progress Notes (Signed)
HISTORY OF PRESENT ILLNESS:  Nichole Davis is a 37 y.o. female with a history of advanced adenomatous colon polyp.  Now for surveillance colonoscopy  REVIEW OF SYSTEMS:  All non-GI ROS negative except for  Past Medical History:  Diagnosis Date   Anemia    Anxiety    Bone marrow donor    for brother   Chest pain    pt. seen at [facility] 07/12/2011, for chest pain, cleared medically, given  Lorazepam & prilosec.  Took Lorazepam & had good relief fr. 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   Laser 4/13 on L eye Surgical repair 5/13 R eye  Has regular follow-up    STD (sexually transmitted disease)    trichomonas 8/09, chlamydia 2014   Vaginal discharge 07/01/2012    Past Surgical History:  Procedure Laterality Date   BONE MARROW HARVEST  2005   CESAREAN SECTION N/A 06/07/2015   Procedure: CESAREAN SECTION;  Surgeon: Linda Hedges, DO;  Location: [place] [facility];  Service: Obstetrics;  Laterality: N/A;   DILATION AND EVACUATION N/A 11/30/2013   Procedure: DILATATION AND EVACUATION;  Surgeon: Lyman Speller, MD;  Location: [place] [facility];  Service: Gynecology;  Laterality: N/A;   implanon removal Left    removed 2011, arm   LAPAROSCOPIC SIGMOID COLECTOMY N/A 01/28/2014   Procedure: LAPAROSCOPIC SIGMOID COLECTOMY, ;  Surgeon: Michael Boston, MD;  Location: [facility];  Service: General;  Laterality: N/A;   LYMPH NODE BIOPSY Left 2010   left side neck   PROCTOSCOPY N/A 01/28/2014   Procedure: PROCTOSCOPY;  Surgeon: Michael Boston, MD;  Location: [facility];  Service: General;  Laterality: N/A;   RETINAL DETACHMENT REPAIR W/ SCLERAL BUCKLE LE Right 07/17/11   eye   RETINAL TEAR REPAIR CRYOTHERAPY Left    SCLERAL BUCKLE  07/17/2011   Procedure: SCLERAL BUCKLE;  Surgeon: Hayden Pedro,  MD;  Location: [place];  Service: Ophthalmology;  Laterality: Right;    Social History Anastasya SAMALA LAFAVOR  reports that she has never smoked. She has never used smokeless tobacco. She reports that she does not drink alcohol and does not use drugs.  family history includes Leukemia in her brother.  No Known Allergies     PHYSICAL EXAMINATION: Vital signs: BP (!) 94/59   Pulse 76   Temp 98.6 F (37 C)   Ht '5\' 2"'$  (1.575 m)   Wt 165 lb (74.8 kg)   SpO2 100%   BMI 30.18 kg/m  General: Well-developed, well-nourished, no acute distress HEENT: Sclerae are anicteric, conjunctiva pink. Oral mucosa intact Lungs: Clear Heart: Regular Abdomen: soft, nontender, nondistended, no obvious ascites, no peritoneal signs, normal bowel sounds. No organomegaly. Extremities: No edema Psychiatric: alert and oriented x3. Cooperative     ASSESSMENT:  History of advanced adenoma   PLAN:  Surveillance colonoscopy

## 2022-05-22 NOTE — Progress Notes (Signed)
Sedate, gd SR, tolerated procedure well, VSS, report to RN 

## 2022-05-23 ENCOUNTER — Telehealth: Payer: Self-pay

## 2022-05-23 NOTE — Telephone Encounter (Signed)
  Follow up Call-     05/22/2022    8:49 AM  Call back number  Post procedure Call Back phone  # [phone]  Permission to leave phone message Yes     Patient questions:  Do you have a fever, pain , or abdominal swelling? No. Pain Score  0 *  Have you tolerated food without any problems? Yes.    Have you been able to return to your normal activities? Yes.    Do you have any questions about your discharge instructions: Diet   No. Medications  No. Follow up visit  No.  Do you have questions or concerns about your Care? No.  Actions: * If pain score is 4 or above: No action needed, pain <4.

## 2022-06-25 ENCOUNTER — Encounter: Payer: 59 | Admitting: Internal Medicine

## 2022-07-11 ENCOUNTER — Telehealth: Payer: Self-pay | Admitting: Internal Medicine

## 2022-07-11 ENCOUNTER — Encounter: Payer: 59 | Admitting: Internal Medicine

## 2022-07-11 MED ORDER — NA SULFATE-K SULFATE-MG SULF 17.5-3.13-1.6 GM/177ML PO SOLN: 1.0000 | Freq: Once | ORAL | 0 refills | Status: AC

## 2022-07-11 NOTE — Telephone Encounter (Signed)
Inbound call from patient requesting a call back from a nurse . Patient is schedule for a procedure tomorrow at 4:00 pm and she stated she don't havethe prep-kit and instructions..Please advise

## 2022-07-11 NOTE — Telephone Encounter (Signed)
Pt did not receive her prep or instructions and she is scheduled for tomorrow at 4 pm with Dr. Marina Goodell. Rz for Suprep sent to her pharmacy and instructions for 2 day prep with Miralax sent to her via Mychart.

## 2022-07-12 ENCOUNTER — Ambulatory Visit (AMBULATORY_SURGERY_CENTER): Payer: 59 | Admitting: Internal Medicine

## 2022-07-12 ENCOUNTER — Encounter: Payer: Self-pay | Admitting: Internal Medicine

## 2022-07-12 VITALS — BP 118/74 | HR 67 | Temp 98.1°F | Resp 14 | Ht 62.0 in | Wt 165.0 lb

## 2022-07-12 DIAGNOSIS — D122 Benign neoplasm of ascending colon: Secondary | ICD-10-CM | POA: Diagnosis not present

## 2022-07-12 DIAGNOSIS — Z86010 Personal history of colonic polyps: Secondary | ICD-10-CM

## 2022-07-12 DIAGNOSIS — Z09 Encounter for follow-up examination after completed treatment for conditions other than malignant neoplasm: Secondary | ICD-10-CM

## 2022-07-12 MED ORDER — SODIUM CHLORIDE 0.9 % IV SOLN
500.0000 mL | INTRAVENOUS | Status: DC
Start: 2022-07-12 — End: 2022-07-12

## 2022-07-12 NOTE — Op Note (Signed)
[facility] Patient Name: Nichole Davis Procedure Date: 07/12/2022 3:54 PM MRN: [id number] Endoscopist: Wilhemina Bonito. Marina Goodell , MD, [id number] Age: 37 Referring MD:  Date of Birth: March 04, 1986 Gender: Female Account #: [id number] Procedure:                Colonoscopy with cold snare polypectomy x 1 Indications:              High risk colon cancer surveillance: Personal                            history of adenoma (10 mm or greater in size), High                            risk colon cancer surveillance: Personal history of                            adenoma with villous component. Large sigmoid colon                            polyp status postresection 2015. Last examination                            2018. Did have surveillance 6 weeks ago but the                            exam was compromised by poor prep Medicines:                Monitored Anesthesia Care Procedure:                Pre-Anesthesia Assessment:                           - Prior to the procedure, a History and Physical                            was performed, and patient medications and                            allergies were reviewed. The patient's tolerance of                            previous anesthesia was also reviewed. The risks                            and benefits of the procedure and the sedation                            options and risks were discussed with the patient.                            All questions were answered, and informed consent                            was obtained. Prior Anticoagulants: The patient has  taken no anticoagulant or antiplatelet agents. ASA                            Grade Assessment: II - A patient with mild systemic                            disease. After reviewing the risks and benefits,                            the patient was deemed in satisfactory condition to                            undergo the procedure.                            After obtaining informed consent, the colonoscope                            was passed under direct vision. Throughout the                            procedure, the patient's blood pressure, pulse, and                            oxygen saturations were monitored continuously. The                            CF HQ190L #[id number] was introduced through the anus                            and advanced to the the cecum, identified by                            appendiceal orifice and ileocecal valve. The                            ileocecal valve, appendiceal orifice, and rectum                            were photographed. The quality of the bowel                            preparation was excellent. The colonoscopy was                            performed without difficulty. The patient tolerated                            the procedure well. The bowel preparation used was                            more extensive prep/SUPREP via split dose  instruction. Scope In: 4:17:09 PM Scope Out: 4:30:56 PM Scope Withdrawal Time: 0 hours 10 minutes 1 second  Total Procedure Duration: 0 hours 13 minutes 47 seconds  Findings:                 A 4 mm polyp was found in the ascending colon. The                            polyp was sessile. The polyp was removed with a                            cold snare. Resection and retrieval were complete.                           Internal hemorrhoids were found during retroflexion.                           The surgical anastomosis was unremarkable and                            located about 20 cm from the anal verge. The exam                            was otherwise without abnormality on direct and                            retroflexion views. Complications:            No immediate complications. Estimated blood loss:                            None. Estimated Blood Loss:     Estimated blood loss: none. Impression:               -  One 4 mm polyp in the ascending colon, removed                            with a cold snare. Resected and retrieved.                           - Internal hemorrhoids.                           - The examination was otherwise normal on direct                            and retroflexion views. Recommendation:           - Repeat colonoscopy in 5 years for surveillance.                           - Patient has a contact number available for                            emergencies. The signs and symptoms of potential  delayed complications were discussed with the                            patient. Return to normal activities tomorrow.                            Written discharge instructions were provided to the                            patient.                           - Resume previous diet.                           - Continue present medications.                           - Await pathology results. Wilhemina Bonito. Marina Goodell, MD 07/12/2022 4:37:37 PM This report has been signed electronically.

## 2022-07-12 NOTE — Progress Notes (Signed)
Patient reports no recent health changes or new medications since colonoscopy last month

## 2022-07-12 NOTE — Progress Notes (Signed)
Called to room to assist during endoscopic procedure.  Patient ID and intended procedure confirmed with present staff. Received instructions for my participation in the procedure from the performing physician.  

## 2022-07-12 NOTE — Progress Notes (Signed)
HISTORY OF PRESENT ILLNESS:  Nichole Davis is a 37 y.o. female with a history of advanced adenoma.  Recent colonoscopy in May 22, 2022 with poor preparation.  Now for repeat exam with reprep.  REVIEW OF SYSTEMS:  All non-GI ROS negative except for  Past Medical History:  Diagnosis Date   Anemia    Anxiety    Bone marrow donor    for brother   Chest pain    pt. seen at [facility] 07/12/2011, for chest pain, cleared medically, given  Lorazepam & prilosec.  Took Lorazepam & had good relief fr. 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   Laser 4/13 on L eye Surgical repair 5/13 R eye  Has regular follow-up    STD (sexually transmitted disease)    trichomonas 8/09, chlamydia 2014   Vaginal discharge 07/01/2012    Past Surgical History:  Procedure Laterality Date   BONE MARROW HARVEST  2005   CESAREAN SECTION N/A 06/07/2015   Procedure: CESAREAN SECTION;  Surgeon: Mitchel Honour, DO;  Location: [facility];  Service: Obstetrics;  Laterality: N/A;   DILATION AND EVACUATION N/A 11/30/2013   Procedure: DILATATION AND EVACUATION;  Surgeon: Annamaria Boots, MD;  Location: [facility];  Service: Gynecology;  Laterality: N/A;   implanon removal Left    removed 2011, arm   LAPAROSCOPIC SIGMOID COLECTOMY N/A 01/28/2014   Procedure: LAPAROSCOPIC SIGMOID COLECTOMY, ;  Surgeon: Karie Soda, MD;  Location: [facility];  Service: General;  Laterality: N/A;   LYMPH NODE BIOPSY Left 2010   left side neck   PROCTOSCOPY N/A 01/28/2014   Procedure: PROCTOSCOPY;  Surgeon: Karie Soda, MD;  Location: [facility];  Service: General;  Laterality: N/A;   RETINAL DETACHMENT REPAIR W/ SCLERAL BUCKLE LE Right 07/17/11   eye   RETINAL TEAR REPAIR CRYOTHERAPY Left    SCLERAL BUCKLE  07/17/2011   Procedure:  SCLERAL BUCKLE;  Surgeon: Sherrie George, MD;  Location: [facility];  Service: Ophthalmology;  Laterality: Right;    Social History Nichole Davis  reports that she has never smoked. She has never used smokeless tobacco. She reports that she does not drink alcohol and does not use drugs.  family history includes Leukemia in her brother.  No Known Allergies     PHYSICAL EXAMINATION: Vital signs: BP 120/78   Pulse 75   Temp 98.1 F (36.7 C)   Ht  (1.575 m)   Wt 165 lb (74.8 kg)   SpO2 97%   BMI 30.18 kg/m  General: Well-developed, well-nourished, no acute distress HEENT: Sclerae are anicteric, conjunctiva pink. Oral mucosa intact Lungs: Clear Heart: Regular Abdomen: soft, nontender, nondistended, no obvious ascites, no peritoneal signs, normal bowel sounds. No organomegaly. Extremities: No edema Psychiatric: alert and oriented x3. Cooperative     ASSESSMENT:  Advanced adenoma 2015 status post resection   PLAN:   Surveillance colonoscopy

## 2022-07-12 NOTE — Patient Instructions (Signed)
YOU HAD AN ENDOSCOPIC PROCEDURE TODAY AT THE  ENDOSCOPY CENTER:   Refer to the procedure report that was given to you for any specific questions about what was found during the examination.  If the procedure report does not answer your questions, please call your gastroenterologist to clarify.  If you requested that your care partner not be given the details of your procedure findings, then the procedure report has been included in a sealed envelope for you to review at your convenience later.  YOU SHOULD EXPECT: Some feelings of bloating in the abdomen. Passage of more gas than usual.  Walking can help get rid of the air that was put into your GI tract during the procedure and reduce the bloating. If you had a lower endoscopy (such as a colonoscopy or flexible sigmoidoscopy) you may notice spotting of blood in your stool or on the toilet paper. If you underwent a bowel prep for your procedure, you may not have a normal bowel movement for a few days.  Please Note:  You might notice some irritation and congestion in your nose or some drainage.  This is from the oxygen used during your procedure.  There is no need for concern and it should clear up in a day or so.  SYMPTOMS TO REPORT IMMEDIATELY:  Following lower endoscopy (colonoscopy or flexible sigmoidoscopy):  Excessive amounts of blood in the stool  Significant tenderness or worsening of abdominal pains  Swelling of the abdomen that is new, acute  Fever of 100F or higher  For urgent or emergent issues, a gastroenterologist can be reached at any hour by calling [phone]. Do not use MyChart messaging for urgent concerns.    DIET:  We do recommend a small meal at first, but then you may proceed to your regular diet.  Drink plenty of fluids but you should avoid alcoholic beverages for 24 hours.  ACTIVITY:  You should plan to take it easy for the rest of today and you should NOT DRIVE or use heavy machinery until tomorrow (because of  the sedation medicines used during the test).    FOLLOW UP: Our staff will call the number listed on your records the next business day following your procedure.  We will call around 7:15- 8:00 am to check on you and address any questions or concerns that you may have regarding the information given to you following your procedure. If we do not reach you, we will leave a message.     If any biopsies were taken you will be contacted by phone or by letter within the next 1-3 weeks.  Please call us at [phone] if you have not heard about the biopsies in 3 weeks.    SIGNATURES/CONFIDENTIALITY: You and/or your care partner have signed paperwork which will be entered into your electronic medical record.  These signatures attest to the fact that that the information above on your After Visit Summary has been reviewed and is understood.  Full responsibility of the confidentiality of this discharge information lies with you and/or your care-partner.  

## 2022-07-12 NOTE — Progress Notes (Signed)
Sedate, gd SR, tolerated procedure well, VSS, report to RN 

## 2022-07-13 ENCOUNTER — Telehealth: Payer: Self-pay | Admitting: *Deleted

## 2022-07-13 NOTE — Telephone Encounter (Signed)
Sent new work note with correct dates.

## 2022-07-13 NOTE — Telephone Encounter (Signed)
Patient called stating that she received note in mychart but it was for the wrong date. She needs the note for yesterday with the date 07/12/2022 because she was out of work then and returned today. Requesting a new note be sent over. Thank you.

## 2022-07-13 NOTE — Telephone Encounter (Signed)
Work note sent to pt via MyChart. Pt notified.

## 2022-07-13 NOTE — Telephone Encounter (Signed)
Inbound call from patient needing a work note for yesterdays procedure. Please advise.

## 2022-07-13 NOTE — Telephone Encounter (Signed)
  Follow up Call-     07/12/2022    3:15 PM 05/22/2022    8:49 AM  Call back number  Post procedure Call Back phone  # [phone]  Permission to leave phone message Yes Yes     Patient questions:  Do you have a fever, pain , or abdominal swelling? No. Pain Score  0 *  Have you tolerated food without any problems? Yes.    Have you been able to return to your normal activities? Yes.    Do you have any questions about your discharge instructions: Diet   No. Medications  No. Follow up visit  No.  Do you have questions or concerns about your Care? No.  Actions: * If pain score is 4 or above: No action needed, pain <4.

## 2022-07-30 ENCOUNTER — Encounter: Payer: Self-pay | Admitting: Internal Medicine

## 2022-11-26 ENCOUNTER — Ambulatory Visit (INDEPENDENT_AMBULATORY_CARE_PROVIDER_SITE_OTHER): Payer: 59 | Admitting: Podiatry

## 2022-11-26 DIAGNOSIS — Z91199 Patient's noncompliance with other medical treatment and regimen due to unspecified reason: Secondary | ICD-10-CM

## 2022-11-26 NOTE — Progress Notes (Signed)
No show

## 2023-12-21 ENCOUNTER — Ambulatory Visit: Admission: EM | Admit: 2023-12-21 | Discharge: 2023-12-21 | Disposition: A | Discharge status: Home / Self Care

## 2023-12-21 ENCOUNTER — Encounter: Payer: Self-pay | Admitting: Emergency Medicine

## 2023-12-21 DIAGNOSIS — Z8619 Personal history of other infectious and parasitic diseases: Secondary | ICD-10-CM | POA: Diagnosis not present

## 2023-12-21 DIAGNOSIS — Z113 Encounter for screening for infections with a predominantly sexual mode of transmission: Secondary | ICD-10-CM | POA: Diagnosis present

## 2023-12-21 DIAGNOSIS — A5901 Trichomonal vulvovaginitis: Secondary | ICD-10-CM | POA: Diagnosis not present

## 2023-12-21 NOTE — ED Provider Notes (Signed)
 [facility]    CSN: [id number] Arrival date & time: 12/21/23  9162      History   Chief Complaint Chief Complaint  Patient presents with   STD Test    HPI Nichole Davis is a 38 y.o. female.   Discussed the use of AI scribe software for clinical note transcription with the patient, who gave verbal consent to proceed.   The patient presents for STD testing, requesting both blood work and a vaginal swab. She reports a recent positive result for Trichomonas vaginalis on a Pap smear performed earlier this week by her gynecologist. She was prescribed treatment and is currently on day two of therapy. The patient expresses concern regarding the accuracy of her prior test and wishes to confirm her current status. She notes her last menstrual period was approximately two weeks ago.  The patient has been in a monogamous relationship with the same partner for the past 10 years. She is confused by the discordance between her positive result and her partner's negative test, as they have been sexually active exclusively with one another. She also reports recent removal of her IUD as she and her partner are attempting to conceive.  She denies any current genitourinary symptoms, including vaginal discharge, itching, irritation, or odor.  The following sections of the patient's history were reviewed and updated as appropriate: allergies, current medications, past family history, past medical history, past social history, past surgical history, and problem list.       Past Medical History:  Diagnosis Date   Anemia    Anxiety    Bone marrow donor    for brother   Chest pain    pt. seen at [facility] 07/12/2011, for chest pain, cleared medically, given  Lorazepam  & prilosec.  Took Lorazepam  & had good relief fr. 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   Laser 4/13 on L eye Surgical repair 5/13 R eye  Has regular follow-up    STD (sexually transmitted disease)    trichomonas 8/09, chlamydia 2014   Vaginal discharge 07/01/2012    Patient Active Problem List   Diagnosis Date Noted   Normal labor 06/07/2015   Pregnancy 06/07/2015   Hematochezia 11/04/2013   Change in bowel habits 11/04/2013   Adenomatous polyp of sigmoid colon s/p colectomy 01/28/2014 08/21/2013   Exposure to STD 07/01/2012    Past Surgical History:  Procedure Laterality Date   BONE MARROW HARVEST  2005   CESAREAN SECTION N/A 06/07/2015   Procedure: CESAREAN SECTION;  Surgeon: Duwaine Blumenthal, DO;  Location: [facility];  Service: Obstetrics;  Laterality: N/A;   DILATION AND EVACUATION N/A 11/30/2013   Procedure: DILATATION AND EVACUATION;  Surgeon: Ronal Elvie Pinal, MD;  Location: [facility];  Service: Gynecology;  Laterality: N/A;   implanon removal Left    removed 2011, arm   LAPAROSCOPIC SIGMOID COLECTOMY N/A 01/28/2014   Procedure: LAPAROSCOPIC SIGMOID COLECTOMY, ;  Surgeon: Elspeth Schultze, MD;  Location: [facility];  Service: General;  Laterality: N/A;   LYMPH NODE BIOPSY Left 2010   left side neck   PROCTOSCOPY N/A 01/28/2014   Procedure: PROCTOSCOPY;  Surgeon: Elspeth Schultze, MD;  Location: [facility];  Service: General;  Laterality: N/A;   RETINAL DETACHMENT REPAIR W/ SCLERAL  BUCKLE LE Right 07/17/11   eye   RETINAL TEAR REPAIR CRYOTHERAPY Left    SCLERAL BUCKLE  07/17/2011   Procedure: SCLERAL BUCKLE;  Surgeon: Norleen JONETTA Ku, MD;  Location: [facility] OR;  Service: Ophthalmology;  Laterality: Right;    OB History     Gravida  4   Para  1   Term  1   Preterm      AB  3   Living  1      SAB  3   IAB  0   Ectopic      Multiple  0   Live Births  1            Home Medications    Prior to Admission medications   Medication Sig Start Date End Date Taking?  Authorizing Provider  Cholecalciferol 1.25 MG (50000 UT) capsule Take 1 capsule every week by oral route. 12/19/23  Yes [provider]    Family History Family History  Problem Relation Age of Onset   Leukemia Brother    Anesthesia problems Neg Hx    Colon cancer Neg Hx    Esophageal cancer Neg Hx    Pancreatic cancer Neg Hx    Rectal cancer Neg Hx    Stomach cancer Neg Hx    Colon polyps Neg Hx     Social History Social History   Tobacco Use   Smoking status: Never    Passive exposure: Never   Smokeless tobacco: Never  Vaping Use   Vaping status: Never Used  Substance Use Topics   Alcohol use: No   Drug use: No     Allergies   Patient has no known allergies.   Review of Systems Review of Systems  Genitourinary:  Negative for dysuria, menstrual problem (LMP about 2 weeks ago) and vaginal discharge.       No vaginal itching, irritation or odor   All other systems reviewed and are negative.    Physical Exam Triage Vital Signs ED Triage Vitals  Encounter Vitals Group     BP 12/21/23 0919 (!) 137/93     Girls Systolic BP Percentile --      Girls Diastolic BP Percentile --      Boys Systolic BP Percentile --      Boys Diastolic BP Percentile --      Pulse Rate 12/21/23 0919 95     Resp 12/21/23 0919 18     Temp 12/21/23 0919 98.2 F (36.8 C)     Temp Source 12/21/23 0919 Oral     SpO2 12/21/23 0919 98 %     Weight 12/21/23 0918 160 lb (72.6 kg)     Height --      Head Circumference --      Peak Flow --      Pain Score 12/21/23 0917 0     Pain Loc --      Pain Education --      Exclude from Growth Chart --    No data found.  Updated Vital Signs BP (!) 137/93 (BP Location: Left Arm)   Pulse 95   Temp 98.2 F (36.8 C) (Oral)   Resp 18   Wt 160 lb (72.6 kg)   LMP 12/08/2023 (Exact Date)   SpO2 98%   Breastfeeding No   BMI 29.26 kg/m   Visual Acuity Right Eye Distance:   Left Eye Distance:   Bilateral Distance:    Right Eye Near:    Left  Eye Near:    Bilateral Near:     Physical Exam Constitutional:      General: She is not in acute distress.    Appearance: Normal appearance. She is not ill-appearing, toxic-appearing or diaphoretic.  HENT:     Head: Normocephalic.     Nose: Nose normal.     Mouth/Throat:     Mouth: Mucous membranes are moist.  Eyes:     Conjunctiva/sclera: Conjunctivae normal.  Cardiovascular:     Rate and Rhythm: Normal rate.  Pulmonary:     Effort: Pulmonary effort is normal.  Abdominal:     Palpations: Abdomen is soft.  Genitourinary:    Comments: Deferred; patient performed self-swab for Aptima testing  Musculoskeletal:        General: Normal range of motion.     Cervical back: Normal range of motion and neck supple.  Skin:    General: Skin is warm and dry.  Neurological:     General: No focal deficit present.     Mental Status: She is alert and oriented to person, place, and time.  Psychiatric:        Mood and Affect: Mood normal.        Behavior: Behavior normal.      UC Treatments / Results  Labs (all labs ordered are listed, but only abnormal results are displayed) Labs Reviewed  RPR  HIV ANTIBODY (ROUTINE TESTING W REFLEX)  POCT URINE PREGNANCY  CERVICOVAGINAL ANCILLARY ONLY    EKG   Radiology No results found.  Procedures Procedures (including critical care time)  Medications Ordered in UC Medications - No data to display  Initial Impression / Assessment and Plan / UC Course  I have reviewed the triage vital signs and the nursing notes.  Pertinent labs & imaging results that were available during my care of the patient were reviewed by me and considered in my medical decision making (see chart for details).     The patient presents for STD evaluation after a recent Pap smear was positive for Trichomonas vaginalis. She reports being started on treatment but has only completed two days of medication. Her female partner tested negative with a  self-collected swab, but the patient is concerned about the difference in results given their monogamous relationship of 10 years. It was explained that trichomonas testing in men has a high false-negative rate, especially with self-swabs or urine testing, as sampling may be inadequate or diluted. Two days of treatment is not sufficient to clear the infection, and repeat testing at this stage may remain positive despite appropriate therapy. A vaginal swab was obtained for comprehensive STD testing including gonorrhea, chlamydia, trichomonas, bacterial vaginosis, and yeast, along with blood work for RPR and HIV. Partner retesting was recommended given the high false-negative rate in men, and urine testing may be considered as an alternative method. Both partners should have negative test results before considering treatment complete. The patient was advised to follow up with her primary care provider for result review and ongoing management, and to seek emergency care for severe abdominal or pelvic pain, fever, heavy vaginal bleeding, or other systemic symptoms.  Today's evaluation has revealed no signs of a dangerous process. Discussed diagnosis with patient and/or guardian. Patient and/or guardian aware of their diagnosis, possible red flag symptoms to watch out for and need for close follow up. Patient and/or guardian understands verbal and written discharge instructions. Patient and/or guardian comfortable with plan and disposition.  Patient and/or guardian has a clear mental status  at this time, good insight into illness (after discussion and teaching) and has clear judgment to make decisions regarding their care  Documentation was completed with the aid of voice recognition software. Transcription may contain typographical errors.  Final Clinical Impressions(s) / UC Diagnoses   Final diagnoses:  Screen for STD (sexually transmitted disease)  History of trichomonal vaginitis     Discharge  Instructions      You were seen today for follow-up after a recent Pap smear showed Trichomonas vaginalis, a common sexually transmitted infection. You have already started treatment, but it has only been two days, which is not enough time for the infection to clear completely. It is normal for the test to still show positive this early in treatment. Your partner's test was negative, but testing in men often gives false-negative results, especially when they collect the sample themselves or use urine testing, because the sample may not pick up the infection well. This means your partner could still carry the infection even if the test shows negative. We obtained a vaginal swab today to check for trichomonas as well as other infections like gonorrhea, chlamydia, bacterial vaginosis, and yeast. We also drew blood for HIV and syphilis (RPR) testing. Both you and your partner should be retested after treatment is complete, and both partners should test negative before it can be considered cleared. Your partner should be retested through his provider, and urine testing may be an option if swabs are not feasible. At home, continue taking your prescribed medication exactly as directed until it is finished, even if your symptoms improve. Avoid sexual activity until both you and your partner have completed treatment and follow-up testing is negative, since having sex before that can lead to reinfection. If you have irritation or burning, wearing cotton underwear, avoiding tight clothing, and rinsing with plain warm water  (instead of soaps or scented products) can help reduce discomfort. Please follow up with your primary care provider to review your test results and ensure your treatment is successful. Go to the emergency department right away if you develop severe pelvic or abdominal pain, fever, chills, heavy vaginal bleeding, or if you feel generally very unwell.     ED Prescriptions   None    PDMP not  reviewed this encounter.   Iola Lukes, [place] 12/21/23 1027

## 2023-12-21 NOTE — ED Triage Notes (Signed)
 Pt presents requesting STD testing including blood work. Pt reports no sxs. She states she saw her OB for a pap this week and was told she had trich. Pt states she has only been with one partner for 10 years so she is wanting to be retested. Last sexual encounter 2 weeks ago.

## 2023-12-21 NOTE — ED Notes (Addendum)
 Pt declined pregnancy test. States she just had her period in the last week or 2

## 2023-12-21 NOTE — Discharge Instructions (Addendum)
 You were seen today for follow-up after a recent Pap smear showed Trichomonas vaginalis, a common sexually transmitted infection. You have already started treatment, but it has only been two days, which is not enough time for the infection to clear completely. It is normal for the test to still show positive this early in treatment. Your partner's test was negative, but testing in men often gives false-negative results, especially when they collect the sample themselves or use urine testing, because the sample may not pick up the infection well. This means your partner could still carry the infection even if the test shows negative. We obtained a vaginal swab today to check for trichomonas as well as other infections like gonorrhea, chlamydia, bacterial vaginosis, and yeast. We also drew blood for HIV and syphilis (RPR) testing. Both you and your partner should be retested after treatment is complete, and both partners should test negative before it can be considered cleared. Your partner should be retested through his provider, and urine testing may be an option if swabs are not feasible. At home, continue taking your prescribed medication exactly as directed until it is finished, even if your symptoms improve. Avoid sexual activity until both you and your partner have completed treatment and follow-up testing is negative, since having sex before that can lead to reinfection. If you have irritation or burning, wearing cotton underwear, avoiding tight clothing, and rinsing with plain warm water  (instead of soaps or scented products) can help reduce discomfort. Please follow up with your primary care provider to review your test results and ensure your treatment is successful. Go to the emergency department right away if you develop severe pelvic or abdominal pain, fever, chills, heavy vaginal bleeding, or if you feel generally very unwell.

## 2023-12-23 ENCOUNTER — Ambulatory Visit (HOSPITAL_COMMUNITY): Payer: Self-pay

## 2023-12-23 LAB — CERVICOVAGINAL ANCILLARY ONLY
Bacterial Vaginitis (gardnerella): NEGATIVE
Candida Glabrata: NEGATIVE
Candida Vaginitis: NEGATIVE
Chlamydia: NEGATIVE
Comment: NEGATIVE
Comment: NEGATIVE
Comment: NEGATIVE
Comment: NEGATIVE
Comment: NEGATIVE
Comment: NORMAL
Neisseria Gonorrhea: NEGATIVE
Trichomonas: POSITIVE — AB

## 2023-12-24 LAB — RPR: RPR Ser Ql: NONREACTIVE

## 2023-12-24 LAB — HIV ANTIBODY (ROUTINE TESTING W REFLEX): HIV Screen 4th Generation wRfx: NONREACTIVE

## 2024-02-11 ENCOUNTER — Ambulatory Visit: Attending: Obstetrics and Gynecology | Admitting: Physical Therapy
